# Patient Record
Sex: Female | Born: 1965 | Race: White | Hispanic: No | Marital: Single | Smoking: Never smoker
Health system: Southern US, Community
[De-identification: ages and names within clinical notes are randomized; demographics above are authoritative.]

## PROBLEM LIST (undated history)

## (undated) DIAGNOSIS — J302 Other seasonal allergic rhinitis: Secondary | ICD-10-CM

## (undated) DIAGNOSIS — I10 Essential (primary) hypertension: Secondary | ICD-10-CM

## (undated) DIAGNOSIS — E559 Vitamin D deficiency, unspecified: Secondary | ICD-10-CM

## (undated) DIAGNOSIS — E041 Nontoxic single thyroid nodule: Secondary | ICD-10-CM

## (undated) DIAGNOSIS — Z8669 Personal history of other diseases of the nervous system and sense organs: Secondary | ICD-10-CM

## (undated) DIAGNOSIS — I499 Cardiac arrhythmia, unspecified: Secondary | ICD-10-CM

## (undated) DIAGNOSIS — R42 Dizziness and giddiness: Secondary | ICD-10-CM

## (undated) DIAGNOSIS — R51 Headache: Secondary | ICD-10-CM

## (undated) DIAGNOSIS — F419 Anxiety disorder, unspecified: Secondary | ICD-10-CM

## (undated) DIAGNOSIS — K589 Irritable bowel syndrome without diarrhea: Secondary | ICD-10-CM

## (undated) DIAGNOSIS — R519 Headache, unspecified: Secondary | ICD-10-CM

## (undated) DIAGNOSIS — R591 Generalized enlarged lymph nodes: Secondary | ICD-10-CM

## (undated) DIAGNOSIS — K219 Gastro-esophageal reflux disease without esophagitis: Secondary | ICD-10-CM

## (undated) DIAGNOSIS — M797 Fibromyalgia: Secondary | ICD-10-CM

## (undated) HISTORY — DX: Generalized enlarged lymph nodes: R59.1

## (undated) HISTORY — DX: Irritable bowel syndrome, unspecified: K58.9

## (undated) HISTORY — PX: DILATION AND CURETTAGE OF UTERUS: SHX78

## (undated) HISTORY — DX: Essential (primary) hypertension: I10

## (undated) HISTORY — DX: Dizziness and giddiness: R42

## (undated) HISTORY — DX: Fibromyalgia: M79.7

## (undated) HISTORY — DX: Personal history of other diseases of the nervous system and sense organs: Z86.69

## (undated) HISTORY — DX: Other seasonal allergic rhinitis: J30.2

## (undated) HISTORY — PX: VAGINAL HYSTERECTOMY: SUR661

## (undated) HISTORY — DX: Vitamin D deficiency, unspecified: E55.9

## (undated) HISTORY — DX: Gastro-esophageal reflux disease without esophagitis: K21.9

---

## 1995-06-30 HISTORY — PX: EYE SURGERY: SHX253

## 1998-06-03 ENCOUNTER — Other Ambulatory Visit: Admission: RE | Admit: 1998-06-03 | Discharge: 1998-06-03 | Payer: Self-pay | Admitting: Obstetrics and Gynecology

## 1999-06-18 ENCOUNTER — Other Ambulatory Visit: Admission: RE | Admit: 1999-06-18 | Discharge: 1999-06-18 | Payer: Self-pay | Admitting: Obstetrics and Gynecology

## 2000-01-02 ENCOUNTER — Encounter: Admission: RE | Admit: 2000-01-02 | Discharge: 2000-01-02 | Payer: Self-pay | Admitting: Obstetrics and Gynecology

## 2000-01-02 ENCOUNTER — Encounter: Payer: Self-pay | Admitting: Obstetrics and Gynecology

## 2000-07-12 ENCOUNTER — Other Ambulatory Visit: Admission: RE | Admit: 2000-07-12 | Discharge: 2000-07-12 | Payer: Self-pay | Admitting: Obstetrics and Gynecology

## 2001-06-07 ENCOUNTER — Encounter: Admission: RE | Admit: 2001-06-07 | Discharge: 2001-06-07 | Payer: Self-pay | Admitting: Obstetrics and Gynecology

## 2001-06-07 ENCOUNTER — Encounter: Payer: Self-pay | Admitting: Obstetrics and Gynecology

## 2001-08-16 ENCOUNTER — Encounter: Payer: Self-pay | Admitting: Gastroenterology

## 2001-08-16 ENCOUNTER — Encounter: Admission: RE | Admit: 2001-08-16 | Discharge: 2001-08-16 | Payer: Self-pay | Admitting: Gastroenterology

## 2001-10-06 ENCOUNTER — Encounter: Payer: Self-pay | Admitting: Orthopaedic Surgery

## 2001-10-06 ENCOUNTER — Encounter: Admission: RE | Admit: 2001-10-06 | Discharge: 2001-10-06 | Payer: Self-pay | Admitting: Orthopaedic Surgery

## 2001-11-15 ENCOUNTER — Encounter: Payer: Self-pay | Admitting: Gastroenterology

## 2001-11-15 ENCOUNTER — Encounter: Admission: RE | Admit: 2001-11-15 | Discharge: 2001-11-15 | Payer: Self-pay | Admitting: Gastroenterology

## 2002-05-18 ENCOUNTER — Encounter: Admission: RE | Admit: 2002-05-18 | Discharge: 2002-05-18 | Payer: Self-pay | Admitting: Family Medicine

## 2002-08-17 ENCOUNTER — Encounter: Payer: Self-pay | Admitting: Obstetrics and Gynecology

## 2002-08-17 ENCOUNTER — Encounter: Admission: RE | Admit: 2002-08-17 | Discharge: 2002-08-17 | Payer: Self-pay | Admitting: Obstetrics and Gynecology

## 2004-02-21 ENCOUNTER — Encounter: Admission: RE | Admit: 2004-02-21 | Discharge: 2004-02-21 | Payer: Self-pay | Admitting: Family Medicine

## 2004-06-24 ENCOUNTER — Other Ambulatory Visit: Admission: RE | Admit: 2004-06-24 | Discharge: 2004-06-24 | Payer: Self-pay | Admitting: Obstetrics and Gynecology

## 2004-12-01 ENCOUNTER — Ambulatory Visit (HOSPITAL_COMMUNITY): Admission: RE | Admit: 2004-12-01 | Discharge: 2004-12-01 | Payer: Self-pay | Admitting: Obstetrics and Gynecology

## 2005-07-07 ENCOUNTER — Other Ambulatory Visit: Admission: RE | Admit: 2005-07-07 | Discharge: 2005-07-07 | Payer: Self-pay | Admitting: Obstetrics and Gynecology

## 2006-07-07 ENCOUNTER — Other Ambulatory Visit: Admission: RE | Admit: 2006-07-07 | Discharge: 2006-07-07 | Payer: Self-pay | Admitting: Obstetrics and Gynecology

## 2006-12-08 ENCOUNTER — Encounter: Admission: RE | Admit: 2006-12-08 | Discharge: 2006-12-08 | Payer: Self-pay | Admitting: Obstetrics and Gynecology

## 2006-12-17 ENCOUNTER — Encounter: Admission: RE | Admit: 2006-12-17 | Discharge: 2006-12-17 | Payer: Self-pay | Admitting: Obstetrics and Gynecology

## 2007-03-18 ENCOUNTER — Encounter: Admission: RE | Admit: 2007-03-18 | Discharge: 2007-03-18 | Payer: Self-pay | Admitting: Orthopaedic Surgery

## 2007-06-20 ENCOUNTER — Encounter: Admission: RE | Admit: 2007-06-20 | Discharge: 2007-06-20 | Payer: Self-pay | Admitting: Gastroenterology

## 2007-07-11 ENCOUNTER — Other Ambulatory Visit: Admission: RE | Admit: 2007-07-11 | Discharge: 2007-07-11 | Payer: Self-pay | Admitting: Obstetrics and Gynecology

## 2007-12-22 ENCOUNTER — Encounter: Admission: RE | Admit: 2007-12-22 | Discharge: 2007-12-22 | Payer: Self-pay | Admitting: Obstetrics and Gynecology

## 2008-08-10 ENCOUNTER — Other Ambulatory Visit: Admission: RE | Admit: 2008-08-10 | Discharge: 2008-08-10 | Payer: Self-pay | Admitting: Obstetrics and Gynecology

## 2008-09-07 ENCOUNTER — Encounter: Admission: RE | Admit: 2008-09-07 | Discharge: 2008-09-07 | Payer: Self-pay | Admitting: Family Medicine

## 2008-11-22 ENCOUNTER — Encounter: Admission: RE | Admit: 2008-11-22 | Discharge: 2008-11-22 | Payer: Self-pay | Admitting: Obstetrics and Gynecology

## 2009-01-20 ENCOUNTER — Emergency Department (HOSPITAL_COMMUNITY): Admission: EM | Admit: 2009-01-20 | Discharge: 2009-01-20 | Payer: Self-pay | Admitting: Emergency Medicine

## 2009-05-03 ENCOUNTER — Encounter: Admission: RE | Admit: 2009-05-03 | Discharge: 2009-05-03 | Payer: Self-pay | Admitting: Family Medicine

## 2009-05-29 HISTORY — PX: OTHER SURGICAL HISTORY: SHX169

## 2009-07-20 ENCOUNTER — Encounter: Admission: RE | Admit: 2009-07-20 | Discharge: 2009-07-20 | Payer: Self-pay | Admitting: Otolaryngology

## 2009-08-12 ENCOUNTER — Other Ambulatory Visit: Admission: RE | Admit: 2009-08-12 | Discharge: 2009-08-12 | Payer: Self-pay | Admitting: Obstetrics and Gynecology

## 2009-11-29 ENCOUNTER — Encounter: Admission: RE | Admit: 2009-11-29 | Discharge: 2009-11-29 | Payer: Self-pay | Admitting: Obstetrics and Gynecology

## 2010-01-05 ENCOUNTER — Emergency Department (HOSPITAL_COMMUNITY): Admission: EM | Admit: 2010-01-05 | Discharge: 2010-01-05 | Payer: Self-pay | Admitting: Emergency Medicine

## 2010-04-16 ENCOUNTER — Encounter: Admission: RE | Admit: 2010-04-16 | Discharge: 2010-04-16 | Payer: Self-pay | Admitting: Obstetrics and Gynecology

## 2010-04-18 ENCOUNTER — Encounter: Admission: RE | Admit: 2010-04-18 | Discharge: 2010-04-18 | Payer: Self-pay | Admitting: Orthopaedic Surgery

## 2010-10-05 LAB — CBC
HCT: 38.3 % (ref 36.0–46.0)
Hemoglobin: 12.9 g/dL (ref 12.0–15.0)
MCV: 82.5 fL (ref 78.0–100.0)
Platelets: 239 10*3/uL (ref 150–400)
RDW: 14.1 % (ref 11.5–15.5)

## 2010-10-05 LAB — POCT CARDIAC MARKERS: Myoglobin, poc: 63.2 ng/mL (ref 12–200)

## 2010-10-05 LAB — POCT I-STAT, CHEM 8
BUN: 12 mg/dL (ref 6–23)
Calcium, Ion: 1.15 mmol/L (ref 1.12–1.32)
Creatinine, Ser: 0.8 mg/dL (ref 0.4–1.2)
Hemoglobin: 13.3 g/dL (ref 12.0–15.0)
Sodium: 140 mEq/L (ref 135–145)
TCO2: 21 mmol/L (ref 0–100)

## 2010-10-05 LAB — DIFFERENTIAL
Basophils Absolute: 0 10*3/uL (ref 0.0–0.1)
Eosinophils Absolute: 0 10*3/uL (ref 0.0–0.7)
Eosinophils Relative: 0 % (ref 0–5)
Lymphocytes Relative: 15 % (ref 12–46)
Monocytes Absolute: 0.4 10*3/uL (ref 0.1–1.0)

## 2010-12-12 ENCOUNTER — Other Ambulatory Visit: Payer: Self-pay | Admitting: Dermatology

## 2010-12-30 ENCOUNTER — Other Ambulatory Visit: Payer: Self-pay | Admitting: Obstetrics and Gynecology

## 2010-12-30 DIAGNOSIS — Z1231 Encounter for screening mammogram for malignant neoplasm of breast: Secondary | ICD-10-CM

## 2011-01-16 ENCOUNTER — Ambulatory Visit
Admission: RE | Admit: 2011-01-16 | Discharge: 2011-01-16 | Disposition: A | Discharge status: Home / Self Care | Payer: BC Managed Care – PPO | Source: Ambulatory Visit | Attending: Obstetrics and Gynecology | Admitting: Obstetrics and Gynecology

## 2011-01-16 DIAGNOSIS — Z1231 Encounter for screening mammogram for malignant neoplasm of breast: Secondary | ICD-10-CM

## 2011-02-11 ENCOUNTER — Encounter (INDEPENDENT_AMBULATORY_CARE_PROVIDER_SITE_OTHER): Payer: BC Managed Care – PPO | Admitting: Ophthalmology

## 2011-02-11 DIAGNOSIS — H43819 Vitreous degeneration, unspecified eye: Secondary | ICD-10-CM

## 2011-02-11 DIAGNOSIS — H33309 Unspecified retinal break, unspecified eye: Secondary | ICD-10-CM

## 2011-02-11 DIAGNOSIS — H2510 Age-related nuclear cataract, unspecified eye: Secondary | ICD-10-CM

## 2011-09-29 ENCOUNTER — Other Ambulatory Visit: Payer: Self-pay | Admitting: "Endocrinology

## 2011-09-29 DIAGNOSIS — E049 Nontoxic goiter, unspecified: Secondary | ICD-10-CM

## 2011-10-02 ENCOUNTER — Other Ambulatory Visit: Payer: BC Managed Care – PPO

## 2011-10-09 ENCOUNTER — Ambulatory Visit
Admission: RE | Admit: 2011-10-09 | Discharge: 2011-10-09 | Disposition: A | Discharge status: Home / Self Care | Payer: BC Managed Care – PPO | Source: Ambulatory Visit | Attending: "Endocrinology | Admitting: "Endocrinology

## 2011-10-09 DIAGNOSIS — E049 Nontoxic goiter, unspecified: Secondary | ICD-10-CM

## 2011-10-23 ENCOUNTER — Other Ambulatory Visit: Payer: Self-pay | Admitting: Dermatology

## 2011-12-15 ENCOUNTER — Other Ambulatory Visit: Payer: Self-pay | Admitting: Obstetrics and Gynecology

## 2011-12-15 DIAGNOSIS — Z1231 Encounter for screening mammogram for malignant neoplasm of breast: Secondary | ICD-10-CM

## 2012-01-22 ENCOUNTER — Ambulatory Visit
Admission: RE | Admit: 2012-01-22 | Discharge: 2012-01-22 | Disposition: A | Discharge status: Home / Self Care | Payer: BC Managed Care – PPO | Source: Ambulatory Visit | Attending: Obstetrics and Gynecology | Admitting: Obstetrics and Gynecology

## 2012-01-22 DIAGNOSIS — Z1231 Encounter for screening mammogram for malignant neoplasm of breast: Secondary | ICD-10-CM

## 2012-02-12 ENCOUNTER — Encounter (INDEPENDENT_AMBULATORY_CARE_PROVIDER_SITE_OTHER): Payer: BC Managed Care – PPO | Admitting: Ophthalmology

## 2012-02-18 ENCOUNTER — Other Ambulatory Visit: Payer: Self-pay | Admitting: Family Medicine

## 2012-02-18 DIAGNOSIS — R310 Gross hematuria: Secondary | ICD-10-CM

## 2012-02-23 ENCOUNTER — Ambulatory Visit
Admission: RE | Admit: 2012-02-23 | Discharge: 2012-02-23 | Disposition: A | Discharge status: Home / Self Care | Payer: BC Managed Care – PPO | Source: Ambulatory Visit | Attending: Family Medicine | Admitting: Family Medicine

## 2012-02-23 DIAGNOSIS — R310 Gross hematuria: Secondary | ICD-10-CM

## 2012-03-30 ENCOUNTER — Encounter (INDEPENDENT_AMBULATORY_CARE_PROVIDER_SITE_OTHER): Payer: BC Managed Care – PPO | Admitting: Ophthalmology

## 2012-03-30 DIAGNOSIS — H5210 Myopia, unspecified eye: Secondary | ICD-10-CM

## 2012-03-30 DIAGNOSIS — H33309 Unspecified retinal break, unspecified eye: Secondary | ICD-10-CM

## 2012-03-30 DIAGNOSIS — H43819 Vitreous degeneration, unspecified eye: Secondary | ICD-10-CM

## 2012-03-30 DIAGNOSIS — H2510 Age-related nuclear cataract, unspecified eye: Secondary | ICD-10-CM

## 2012-04-23 DIAGNOSIS — G43009 Migraine without aura, not intractable, without status migrainosus: Secondary | ICD-10-CM | POA: Insufficient documentation

## 2012-10-26 DIAGNOSIS — R3129 Other microscopic hematuria: Secondary | ICD-10-CM | POA: Insufficient documentation

## 2012-10-26 DIAGNOSIS — R310 Gross hematuria: Secondary | ICD-10-CM | POA: Insufficient documentation

## 2012-12-19 ENCOUNTER — Other Ambulatory Visit: Payer: Self-pay

## 2012-12-19 DIAGNOSIS — Z1231 Encounter for screening mammogram for malignant neoplasm of breast: Secondary | ICD-10-CM

## 2013-01-27 ENCOUNTER — Ambulatory Visit
Admission: RE | Admit: 2013-01-27 | Discharge: 2013-01-27 | Disposition: A | Discharge status: Home / Self Care | Payer: BC Managed Care – PPO | Source: Ambulatory Visit

## 2013-01-27 ENCOUNTER — Ambulatory Visit: Payer: BC Managed Care – PPO

## 2013-01-27 DIAGNOSIS — Z1231 Encounter for screening mammogram for malignant neoplasm of breast: Secondary | ICD-10-CM

## 2013-03-31 ENCOUNTER — Ambulatory Visit (INDEPENDENT_AMBULATORY_CARE_PROVIDER_SITE_OTHER): Payer: BC Managed Care – PPO | Admitting: Ophthalmology

## 2013-04-21 ENCOUNTER — Other Ambulatory Visit: Payer: Self-pay | Admitting: Family Medicine

## 2013-04-21 DIAGNOSIS — R1013 Epigastric pain: Secondary | ICD-10-CM

## 2013-04-26 ENCOUNTER — Other Ambulatory Visit: Payer: BC Managed Care – PPO

## 2013-05-05 ENCOUNTER — Ambulatory Visit (INDEPENDENT_AMBULATORY_CARE_PROVIDER_SITE_OTHER): Payer: BC Managed Care – PPO | Admitting: Ophthalmology

## 2013-05-05 DIAGNOSIS — H2510 Age-related nuclear cataract, unspecified eye: Secondary | ICD-10-CM

## 2013-05-05 DIAGNOSIS — H43819 Vitreous degeneration, unspecified eye: Secondary | ICD-10-CM

## 2013-05-05 DIAGNOSIS — H33309 Unspecified retinal break, unspecified eye: Secondary | ICD-10-CM

## 2014-03-16 ENCOUNTER — Other Ambulatory Visit: Payer: Self-pay

## 2014-03-16 DIAGNOSIS — Z1231 Encounter for screening mammogram for malignant neoplasm of breast: Secondary | ICD-10-CM

## 2014-04-13 ENCOUNTER — Ambulatory Visit
Admission: RE | Admit: 2014-04-13 | Discharge: 2014-04-13 | Disposition: A | Discharge status: Home / Self Care | Payer: BC Managed Care – PPO | Source: Ambulatory Visit

## 2014-04-13 ENCOUNTER — Encounter (INDEPENDENT_AMBULATORY_CARE_PROVIDER_SITE_OTHER): Payer: Self-pay

## 2014-04-13 DIAGNOSIS — Z1231 Encounter for screening mammogram for malignant neoplasm of breast: Secondary | ICD-10-CM

## 2014-04-17 ENCOUNTER — Other Ambulatory Visit: Payer: Self-pay | Admitting: Nurse Practitioner

## 2014-04-17 DIAGNOSIS — R2231 Localized swelling, mass and lump, right upper limb: Secondary | ICD-10-CM

## 2014-04-27 ENCOUNTER — Ambulatory Visit
Admission: RE | Admit: 2014-04-27 | Discharge: 2014-04-27 | Disposition: A | Discharge status: Home / Self Care | Payer: BC Managed Care – PPO | Source: Ambulatory Visit | Attending: Nurse Practitioner | Admitting: Nurse Practitioner

## 2014-04-27 DIAGNOSIS — R2231 Localized swelling, mass and lump, right upper limb: Secondary | ICD-10-CM

## 2014-06-01 ENCOUNTER — Ambulatory Visit (INDEPENDENT_AMBULATORY_CARE_PROVIDER_SITE_OTHER): Payer: BC Managed Care – PPO | Admitting: Ophthalmology

## 2014-06-01 DIAGNOSIS — H33303 Unspecified retinal break, bilateral: Secondary | ICD-10-CM

## 2014-06-01 DIAGNOSIS — H43813 Vitreous degeneration, bilateral: Secondary | ICD-10-CM

## 2015-05-22 ENCOUNTER — Other Ambulatory Visit: Payer: Self-pay

## 2015-05-22 DIAGNOSIS — Z1231 Encounter for screening mammogram for malignant neoplasm of breast: Secondary | ICD-10-CM

## 2015-06-14 ENCOUNTER — Ambulatory Visit (INDEPENDENT_AMBULATORY_CARE_PROVIDER_SITE_OTHER): Payer: BLUE CROSS/BLUE SHIELD | Admitting: Ophthalmology

## 2015-06-14 DIAGNOSIS — H2512 Age-related nuclear cataract, left eye: Secondary | ICD-10-CM

## 2015-06-14 DIAGNOSIS — H33303 Unspecified retinal break, bilateral: Secondary | ICD-10-CM | POA: Diagnosis not present

## 2015-06-14 DIAGNOSIS — H35033 Hypertensive retinopathy, bilateral: Secondary | ICD-10-CM | POA: Diagnosis not present

## 2015-06-14 DIAGNOSIS — H25031 Anterior subcapsular polar age-related cataract, right eye: Secondary | ICD-10-CM

## 2015-06-14 DIAGNOSIS — H43813 Vitreous degeneration, bilateral: Secondary | ICD-10-CM | POA: Diagnosis not present

## 2015-06-14 DIAGNOSIS — I10 Essential (primary) hypertension: Secondary | ICD-10-CM | POA: Diagnosis not present

## 2015-06-28 ENCOUNTER — Ambulatory Visit
Admission: RE | Admit: 2015-06-28 | Discharge: 2015-06-28 | Disposition: A | Discharge status: Home / Self Care | Payer: BLUE CROSS/BLUE SHIELD | Source: Ambulatory Visit

## 2015-06-28 DIAGNOSIS — Z1231 Encounter for screening mammogram for malignant neoplasm of breast: Secondary | ICD-10-CM

## 2015-07-01 ENCOUNTER — Ambulatory Visit (INDEPENDENT_AMBULATORY_CARE_PROVIDER_SITE_OTHER): Payer: BLUE CROSS/BLUE SHIELD | Admitting: Ophthalmology

## 2015-07-01 DIAGNOSIS — H33332 Multiple defects of retina without detachment, left eye: Secondary | ICD-10-CM

## 2015-11-08 ENCOUNTER — Ambulatory Visit (INDEPENDENT_AMBULATORY_CARE_PROVIDER_SITE_OTHER): Payer: BLUE CROSS/BLUE SHIELD | Admitting: Ophthalmology

## 2015-11-08 DIAGNOSIS — H33303 Unspecified retinal break, bilateral: Secondary | ICD-10-CM | POA: Diagnosis not present

## 2015-11-08 DIAGNOSIS — I10 Essential (primary) hypertension: Secondary | ICD-10-CM

## 2015-11-08 DIAGNOSIS — H43813 Vitreous degeneration, bilateral: Secondary | ICD-10-CM | POA: Diagnosis not present

## 2015-11-08 DIAGNOSIS — H35033 Hypertensive retinopathy, bilateral: Secondary | ICD-10-CM

## 2016-05-26 ENCOUNTER — Other Ambulatory Visit: Payer: Self-pay | Admitting: Obstetrics and Gynecology

## 2016-05-26 DIAGNOSIS — Z1231 Encounter for screening mammogram for malignant neoplasm of breast: Secondary | ICD-10-CM

## 2016-07-06 ENCOUNTER — Ambulatory Visit
Admission: RE | Admit: 2016-07-06 | Discharge: 2016-07-06 | Disposition: A | Discharge status: Home / Self Care | Payer: BLUE CROSS/BLUE SHIELD | Source: Ambulatory Visit | Attending: Obstetrics and Gynecology | Admitting: Obstetrics and Gynecology

## 2016-07-06 DIAGNOSIS — Z1231 Encounter for screening mammogram for malignant neoplasm of breast: Secondary | ICD-10-CM

## 2016-10-20 DIAGNOSIS — K219 Gastro-esophageal reflux disease without esophagitis: Secondary | ICD-10-CM | POA: Insufficient documentation

## 2016-10-20 DIAGNOSIS — M26629 Arthralgia of temporomandibular joint, unspecified side: Secondary | ICD-10-CM | POA: Insufficient documentation

## 2016-10-20 DIAGNOSIS — J301 Allergic rhinitis due to pollen: Secondary | ICD-10-CM | POA: Insufficient documentation

## 2016-10-30 ENCOUNTER — Ambulatory Visit (INDEPENDENT_AMBULATORY_CARE_PROVIDER_SITE_OTHER): Payer: BLUE CROSS/BLUE SHIELD | Admitting: Ophthalmology

## 2016-10-30 DIAGNOSIS — H33303 Unspecified retinal break, bilateral: Secondary | ICD-10-CM | POA: Diagnosis not present

## 2016-10-30 DIAGNOSIS — H43813 Vitreous degeneration, bilateral: Secondary | ICD-10-CM | POA: Diagnosis not present

## 2016-10-30 DIAGNOSIS — H35033 Hypertensive retinopathy, bilateral: Secondary | ICD-10-CM

## 2016-10-30 DIAGNOSIS — I10 Essential (primary) hypertension: Secondary | ICD-10-CM

## 2016-10-30 DIAGNOSIS — H2513 Age-related nuclear cataract, bilateral: Secondary | ICD-10-CM | POA: Diagnosis not present

## 2016-11-13 ENCOUNTER — Ambulatory Visit (INDEPENDENT_AMBULATORY_CARE_PROVIDER_SITE_OTHER): Payer: BLUE CROSS/BLUE SHIELD | Admitting: Ophthalmology

## 2017-04-13 DIAGNOSIS — M797 Fibromyalgia: Secondary | ICD-10-CM

## 2017-04-13 HISTORY — DX: Fibromyalgia: M79.7

## 2017-04-29 ENCOUNTER — Encounter: Payer: Self-pay | Admitting: Cardiothoracic Surgery

## 2017-04-29 ENCOUNTER — Encounter: Payer: Self-pay | Admitting: *Deleted

## 2017-04-29 ENCOUNTER — Institutional Professional Consult (permissible substitution) (INDEPENDENT_AMBULATORY_CARE_PROVIDER_SITE_OTHER): Payer: 59 | Admitting: Cardiothoracic Surgery

## 2017-04-29 ENCOUNTER — Other Ambulatory Visit: Payer: Self-pay | Admitting: *Deleted

## 2017-04-29 VITALS — BP 145/98 | HR 88 | Ht 69.0 in | Wt 157.0 lb

## 2017-04-29 DIAGNOSIS — I10 Essential (primary) hypertension: Secondary | ICD-10-CM

## 2017-04-29 DIAGNOSIS — K219 Gastro-esophageal reflux disease without esophagitis: Secondary | ICD-10-CM

## 2017-04-29 DIAGNOSIS — Z8669 Personal history of other diseases of the nervous system and sense organs: Secondary | ICD-10-CM

## 2017-04-29 DIAGNOSIS — R590 Localized enlarged lymph nodes: Secondary | ICD-10-CM

## 2017-04-29 DIAGNOSIS — R220 Localized swelling, mass and lump, head: Secondary | ICD-10-CM

## 2017-04-29 DIAGNOSIS — E559 Vitamin D deficiency, unspecified: Secondary | ICD-10-CM | POA: Insufficient documentation

## 2017-04-29 DIAGNOSIS — J302 Other seasonal allergic rhinitis: Secondary | ICD-10-CM

## 2017-04-29 DIAGNOSIS — K589 Irritable bowel syndrome without diarrhea: Secondary | ICD-10-CM | POA: Insufficient documentation

## 2017-04-29 DIAGNOSIS — R42 Dizziness and giddiness: Secondary | ICD-10-CM

## 2017-04-29 DIAGNOSIS — E042 Nontoxic multinodular goiter: Secondary | ICD-10-CM | POA: Insufficient documentation

## 2017-04-29 DIAGNOSIS — M797 Fibromyalgia: Secondary | ICD-10-CM

## 2017-04-29 NOTE — Progress Notes (Signed)
[address]       [place] [address]             [phone]                    Kristin [name] [facility] Medical Record #[id number] Date of Birth: [birth year]  Referring: Glenford Peers, MD Primary Care: Olivia Canter, MD  Chief Complaint:    Chief Complaint  Patient presents with  . New Patient (Initial Visit)    lymphadenopathy tumor mass, CT Neck 04/24/2017    History of Present Illness:    Kristin [name] 51 y.o. female is seen in the office  today for 9-10 months of left face swelling.  She has a history of goiter with a thyroid biopsy done in 2013.  She has had ENT and dental evaluation for the face swelling.  She denies any history of fever chills night sweats, weight has been stable and 157 pounds.  The patient had a CT scan of the neck done at [facility] imaging report dated 04/24/2017, type report notes marked lymphadenopathy tumor mass-effect circum-the left lateral aortic arch there is lymphadenopathy in the precarinal and paratracheal areas    Family history is significant for an identical twin sister who has asthma and hypertension. Current Activity/ Functional Status:  Patient is independent with mobility/ambulation, transfers, ADL's, IADL's.   Zubrod Score: At the time of surgery this patient's most appropriate activity status/level should be described as: []     0    Normal activity, no symptoms []     1    Restricted in physical strenuous activity but ambulatory, able to do out light work []     2    Ambulatory and capable of self care, unable to do work activities, up and about               >50 % of waking hours                              []     3    Only limited self care, in bed greater than 50% of waking hours []     4    Completely disabled, no self care, confined to bed or chair []     5    Moribund   Past Medical History:  Diagnosis Date  . Dizziness   . Fibromyalgia 04/13/2017  . GERD (gastroesophageal reflux disease)   .  History of migraine headaches   . Hypertension   . IBS (irritable bowel syndrome)   . Lymphadenopathy   . Seasonal allergies   . Vertigo   . Vitamin D deficiency     Past Surgical History:  Procedure Laterality Date  . DILATION AND CURETTAGE OF UTERUS  2006, 2009  . EYE SURGERY Left 1997   LASER  . THYOID NODULE  05/2009   FNA...DR. Richardson Landry .Marland KitchenMarland KitchenMarland KitchenNEG  . VAGINAL HYSTERECTOMY  12/2017   LAPAROSCOPICALLY ASSISTED/DR. ELIZABETH SKINNER    Family History  Problem Relation Age of Onset  . Hypertension Mother   . Diabetes Mother   . Hyperthyroidism Mother   . Hypertension Father   . Heart disease Father   . Hypothyroidism Father   . Cardiomyopathy Father   . Other Father 52       CAUSE OF DEATH..CEREBRAL HEMORR  . Cardiomyopathy Paternal Uncle   . Heart disease Paternal Uncle  CABG  . Diabetes Maternal Grandmother   . Hypertension Maternal Grandmother   . Heart disease Maternal Grandmother        MI  . Other Maternal Grandmother        GRAVES DISEASE  . Cardiomyopathy Maternal Grandmother   . Other Maternal Grandfather        AAA  . Cardiomyopathy Maternal Grandfather   . Heart disease Maternal Grandfather        MI  . Cancer Paternal Grandmother        BREAST, OTHER UNSPECIFIED SITE  . Hypertension Paternal Grandfather   . Stroke Paternal Grandfather        ISCHEMIC STROKE  . Other Maternal Aunt        THYROID DISEASE  . Heart disease Paternal Aunt        CABG  . Cardiomyopathy Paternal Aunt   . Cancer Paternal Aunt        STOMACH  . Other Cousin        THYROID     Social History   Social History  . Marital status: Single    Spouse name: N/A  . Number of children: N/A  . Years of education: N/A   Occupational History  . Not on file.   Social History Main Topics  . Smoking status: Never Smoker  . Smokeless tobacco: Never Used  . Alcohol use No  . Drug use: No  . Sexual activity: No   Other Topics Concern  . Not on file   Social  History Narrative  . No narrative on file    History  Smoking Status  . Never Smoker  Smokeless Tobacco  . Never Used    History  Alcohol Use No     Allergies  Allergen Reactions  . Amoxicillin Hives  . Azithromycin Other (See Comments)    UPSET STOMACH   . Bentyl [Dicyclomine Hcl] Diarrhea  . Doxycycline Hives  . Erythromycin Other (See Comments)    STOMACH CRAMPS  . Indocin [Indomethacin] Hives  . Lansoprazole Other (See Comments)    HEADACHE  . Levaquin [Levofloxacin] Hives  . Nexium [Esomeprazole] Hives and Other (See Comments)    HEADACHE   . Probiotic [Acidophilus] Hives  . Sudafed [Pseudoephedrine Hcl] Other (See Comments)    FAST PULSE  . Sulfamethoxazole   . Tetanus Toxoids Other (See Comments)    LOCAL REACTION...REDNESS/KNOT  . Keflex [Cephalexin] Rash    Current Outpatient Prescriptions  Medication Sig Dispense Refill  . acetaminophen (TYLENOL) 500 MG tablet Take 500 mg by mouth every 8 (eight) hours as needed.    . Biotin 11914 MCG TABS Take 1 tablet by mouth daily.    . Cholecalciferol (VITAMIN D3) 5000 units CAPS Take 1 capsule by mouth daily.    Marland Kitchen doxepin (SINEQUAN) 10 MG capsule Take 10 mg by mouth at bedtime. 4-6 CAPSULES HS    . frovatriptan (FROVA) 2.5 MG tablet Take 2.5 mg by mouth as needed for migraine. If recurs, may repeat after 2 hours. Max of 3 tabs in 24 hours.    Marland Kitchen loratadine (CLARITIN) 10 MG tablet Take 10 mg by mouth daily as needed for allergies.    Marland Kitchen losartan (COZAAR) 25 MG tablet Take 25 mg by mouth daily.    . Multiple Vitamin (MULTIVITAMIN) tablet Take 1 tablet by mouth daily.    Marland Kitchen Zoster Vaccine Adjuvanted [facility]) injection Inject 0.5 mLs into the muscle once. NOW (10/16.18) AND IN 2 TO 6 MONTHS  No current facility-administered medications for this visit.     Pertinent items are noted in HPI.   Review of Systems:     Cardiac Review of Systems: Y or N  Chest Pain [ N   ]  Resting SOB [ N] Exertional SOB  Klaus.Mock[N  ]    Pollyann Kennedyrthopnea Klaus.Mock[N  ]   Pedal Edema Klaus.Mock[N  ]    Palpitations Klaus.Mock[N ] SN ]   Presyncope [ N  ]  General Review of Systems: [Y] = yes [  ]=no Constitional: recent weight change Klaus.Mock[N  ];  Wt loss over the last 3 months [   ] anorexia [  ]; fatigue [  ]; nausea [  ]; night sweats [ N ]; fever [ N ]; or chills N[  ];          Dental: poor dentition[N  ]; Last Dentist visit:   Eye : blurred vision [  ]; diplopia [   ]; vision changes [  ];  Amaurosis fugax[  ]; Resp: cough Klaus.Mock[N  ];  wheezing[N;  hemoptysis[ N ]; shortness of breath[ N ]; paroxysmal nocturnal dyspnea[ N]; dyspnea on exertion[  ]; or orthopnea[  ];  GI:  gallstones[  ], vomiting[  ];  dysphagia[  ]; melena[  ];  hematochezia [  ]; heartburn[  ];   Hx of  Colonoscopy[  ]; GU: kidney stones [  ]; hematuria[  ];   dysuria [  ];  nocturia[  ];  history of     obstruction [  ]; urinary frequency [  ]             Skin: rash, swelling[  ];, hair loss[  ];  peripheral edema[  ];  or itching[  ]; Musculosketetal: myalgias[  ];  joint swelling[  ];  joint erythema[  ];  joint pain[  ];  back pain[  ];  Heme/Lymph: bruising[  ];  bleeding[  ];  anemia[  ];  Neuro: TIA[  ];  headaches[  ];  stroke[  ];  vertigo[  ];  seizures[  ];   paresthesias[  ];  difficulty walking[  ];  Psych:depression[  ]; anxiety[  ];  Endocrine: diabetes[ N ];  thyroid dysfunction[Y  ];  Immunizations: Flu up to date [ Y ]; Pneumococcal up to date [ Y ];  Other:  Physical Exam: BP (!) 145/98 (BP Location: Left Arm)   Pulse 88   Ht 5\' 9"  (1.753 m)   Wt 157 lb (71.2 kg)   SpO2 97%   BMI 23.18 kg/m   PHYSICAL EXAMINATION: General appearance: alert, cooperative and appears stated age Head: Normocephalic, without obvious abnormality, atraumatic Neck: no adenopathy, no carotid bruit, no JVD, supple, symmetrical, trachea midline and thyroid not enlarged, symmetric, no tenderness/mass/nodules Lymph nodes: Cervical, supraclavicular, and axillary nodes normal. and right supuclavicular  fossa fills full, no definate node  Resp: clear to auscultation bilaterally Back: symmetric, no curvature. ROM normal. No CVA tenderness. Cardio: regular rate and rhythm, S1, S2 normal, no murmur, click, rub or gallop GI: soft, non-tender; bowel sounds normal; no masses,  no organomegaly Extremities: extremities normal, atraumatic, no cyanosis or edema and Homans sign is negative, no sign of DVT Neurologic: Grossly normal  Diagnostic Studies & Laboratory data:   Paper report of ct scan , unable view disk she brought with her       Recent Lab Findings: Lab Results  Component Value Date   WBC 7.9  01/20/2009   HGB 13.3 01/20/2009   HCT 39.0 01/20/2009   PLT 239 01/20/2009   GLUCOSE 92 01/20/2009   NA 140 01/20/2009   K 3.6 01/20/2009   CL 107 01/20/2009   CREATININE 0.8 01/20/2009   BUN 12 01/20/2009      Assessment / Plan:   CT of neck suggests significant adenopathy in the chest that will require bx. I have recommended to the patient that we proceed with PET scan to fully evaluate adenopathy and determine the most like are to bx  . We will arrange  PET ASAP and then follow up with biopsy as indicated       I  spent 40 minutes counseling the patient face to face and 50% or more the  time was spent in counseling and coordination of care. The total time spent in the appointment was 60 minutes.  Delight Ovens MD      [address] [place] [place] [address] Office [phone]   Beeper [phone]  04/29/2017 12:33 PM

## 2017-05-05 ENCOUNTER — Encounter
Admission: RE | Admit: 2017-05-05 | Discharge: 2017-05-05 | Disposition: A | Discharge status: Home / Self Care | Payer: 59 | Source: Ambulatory Visit | Attending: Cardiothoracic Surgery | Admitting: Cardiothoracic Surgery

## 2017-05-05 DIAGNOSIS — R220 Localized swelling, mass and lump, head: Secondary | ICD-10-CM | POA: Diagnosis present

## 2017-05-05 DIAGNOSIS — R590 Localized enlarged lymph nodes: Secondary | ICD-10-CM | POA: Insufficient documentation

## 2017-05-05 LAB — GLUCOSE, CAPILLARY: Glucose-Capillary: 95 mg/dL (ref 65–99)

## 2017-05-05 MED ORDER — FLUDEOXYGLUCOSE F - 18 (FDG) INJECTION
12.2500 | Freq: Once | INTRAVENOUS | Status: AC | PRN
  Administered 2017-05-05: 12.25 via INTRAVENOUS

## 2017-05-06 ENCOUNTER — Encounter: Payer: Self-pay | Admitting: Cardiothoracic Surgery

## 2017-05-06 ENCOUNTER — Ambulatory Visit (INDEPENDENT_AMBULATORY_CARE_PROVIDER_SITE_OTHER): Payer: 59 | Admitting: Cardiothoracic Surgery

## 2017-05-06 ENCOUNTER — Other Ambulatory Visit: Payer: Self-pay

## 2017-05-06 VITALS — BP 159/110 | HR 109 | Resp 16 | Ht 69.0 in | Wt 157.0 lb

## 2017-05-06 DIAGNOSIS — R220 Localized swelling, mass and lump, head: Secondary | ICD-10-CM | POA: Diagnosis not present

## 2017-05-06 DIAGNOSIS — R590 Localized enlarged lymph nodes: Secondary | ICD-10-CM | POA: Diagnosis not present

## 2017-05-06 NOTE — Progress Notes (Signed)
[address]       [place] [id number]             [phone]                    [name] [facility] Medical Record #[id number] Date of Birth: 1965/09/03  Referring: Glenford PeersNeijstrom, Eric, MD Primary Care: Olivia CanterKelly, [name], MD  Chief Complaint:    Chief Complaint  Patient presents with  . Follow-up    after PET 05/05/17    History of Present Illness:    Kristin [name] 51 y.o. female is seen in the office  today for 9-10 months of left face swelling.  She has a history of goiter with a thyroid biopsy done in 2013.  She has had ENT and dental evaluation for the face swelling.  She denies any history of fever chills night sweats, weight has been stable and 157 pounds.  The patient had a CT [facility] report dated 04/24/2017, type report notes marked lymphadenopathy tumor mass-effect circum-the left lateral aortic arch there is lymphadenopathy in the precarinal and paratracheal areas   Patient returns today after having PET scan to fully evaluate her lymph node status-and to make a decision about the most appropriate biopsy method  Family history is significant for an identical twin sister who has asthma and hypertension. Current Activity/ Functional Status:  Patient is independent with mobility/ambulation, transfers, ADL's, IADL's.   Zubrod Score: At the time of surgery this patient's most appropriate activity status/level should be described as: [x]     0    Normal activity, no symptoms []     1    Restricted in physical strenuous activity but ambulatory, able to do out light work []     2    Ambulatory and capable of self care, unable to do work activities, up and about               >50 % of waking hours                              []     3    Only limited self care, in bed greater than 50% of waking hours []     4    Completely disabled, no self care, confined to bed or chair []     5    Moribund   Past Medical History:  Diagnosis  Date  . Dizziness   . Fibromyalgia 04/13/2017  . GERD (gastroesophageal reflux disease)   . History of migraine headaches   . Hypertension   . IBS (irritable bowel syndrome)   . Lymphadenopathy   . Seasonal allergies   . Vertigo   . Vitamin D deficiency     Past Surgical History:  Procedure Laterality Date  . DILATION AND CURETTAGE OF UTERUS  2006, 2009  . EYE SURGERY Left 1997   LASER  . THYOID NODULE  05/2009   FNA...DR. Richardson LandrySOLLENBERGER .Marland Kitchen.Marland Kitchen.Marland Kitchen.NEG  . VAGINAL HYSTERECTOMY  12/2017   LAPAROSCOPICALLY ASSISTED/DR. ELIZABETH SKINNER    Family History  Problem Relation Age of Onset  . Hypertension Mother   . Diabetes Mother   . Hyperthyroidism Mother   . Hypertension Father   . Heart disease Father   . Hypothyroidism Father   . Cardiomyopathy Father   . Other Father [id number]       CAUSE OF DEATH..CEREBRAL  HEMORR  . Cardiomyopathy Paternal Uncle   . Heart disease Paternal Uncle        CABG  . Diabetes Maternal Grandmother   . Hypertension Maternal Grandmother   . Heart disease Maternal Grandmother        MI  . Other Maternal Grandmother        GRAVES DISEASE  . Cardiomyopathy Maternal Grandmother   . Other Maternal Grandfather        AAA  . Cardiomyopathy Maternal Grandfather   . Heart disease Maternal Grandfather        MI  . Cancer Paternal Grandmother        BREAST, OTHER UNSPECIFIED SITE  . Hypertension Paternal Grandfather   . Stroke Paternal Grandfather        ISCHEMIC STROKE  . Other Maternal Aunt        THYROID DISEASE  . Heart disease Paternal Aunt        CABG  . Cardiomyopathy Paternal Aunt   . Cancer Paternal Aunt        STOMACH  . Other Cousin        THYROID     Social History   Socioeconomic History  . Marital status: Single    Spouse name: Not on file  .    .    .    Social Needs  .    .    .    .    .    Occupational History  t  teaches elementary school  Tobacco Use  . Smoking status: Never Smoker  . Smokeless tobacco: Never Used    Substance and Sexual Activity  . Alcohol use: No  . Drug use: No  . Sexual activity: No    Partners: Male  Other Topics Concern  . Not on file  Social History Narrative  . Not on file    Social History   Tobacco Use  Smoking Status Never Smoker  Smokeless Tobacco Never Used    Social History   Substance and Sexual Activity  Alcohol Use No     Allergies  Allergen Reactions  . Amoxicillin Hives  . Azithromycin Other (See Comments)    UPSET STOMACH   . Bentyl [Dicyclomine Hcl] Diarrhea  . Doxycycline Hives  . Erythromycin Other (See Comments)    STOMACH CRAMPS  . Indocin [Indomethacin] Hives  . Lansoprazole Other (See Comments)    HEADACHE  . Levaquin [Levofloxacin] Hives  . Nexium [Esomeprazole] Hives and Other (See Comments)    HEADACHE   . Probiotic [Acidophilus] Hives  . Sudafed [Pseudoephedrine Hcl] Other (See Comments)    FAST PULSE  . Sulfamethoxazole   . Tetanus Toxoids Other (See Comments)    LOCAL REACTION...REDNESS/KNOT  . Keflex [Cephalexin] Rash    Current Outpatient Medications  Medication Sig Dispense Refill  . acetaminophen (TYLENOL) 500 MG tablet Take 500 mg by mouth every 8 (eight) hours as needed.    . Biotin 1610910000 MCG TABS Take 1 tablet by mouth daily.    . Cholecalciferol (VITAMIN D3) 5000 units CAPS Take 1 capsule by mouth daily.    Marland Kitchen. doxepin (SINEQUAN) 10 MG capsule Take 10 mg by mouth at bedtime. 4-6 CAPSULES HS    . frovatriptan (FROVA) 2.5 MG tablet Take 2.5 mg by mouth as needed for migraine. If recurs, may repeat after 2 hours. Max of 3 tabs in 24 hours.    Marland Kitchen. loratadine (CLARITIN) 10 MG tablet Take 10 mg by mouth daily as  needed for allergies.    Marland Kitchen losartan (COZAAR) 25 MG tablet Take 25 mg by mouth daily.    . Multiple Vitamin (MULTIVITAMIN) tablet Take 1 tablet by mouth daily.    Marland Kitchen Zoster Vaccine Adjuvanted [facility]) injection Inject 0.5 mLs into the muscle once. NOW (10/16.18) AND IN 2 TO 6 MONTHS     No current  facility-administered medications for this visit.     Pertinent items are noted in HPI.   Review of Systems:     Cardiac Review of Systems: Y or N  Chest Pain [ N   ]  Resting SOB [ N] Exertional SOB  Klaus.Mock  ]  Pollyann Kennedy Klaus.Mock  ]   Pedal Edema Klaus.Mock  ]    Palpitations Klaus.Mock ] SN ]   Presyncope [ N  ]  General Review of Systems: [Y] = yes [  ]=no Constitional: recent weight change Klaus.Mock  ];  Wt loss over the last 3 months [   ] anorexia [  ]; fatigue [  ]; nausea [  ]; night sweats [ N ]; fever [ N ]; or chills N[  ];          Dental: poor dentition[N  ]; Last Dentist visit:   Eye : blurred vision [  ]; diplopia [   ]; vision changes [  ];  Amaurosis fugax[  ]; Resp: cough Klaus.Mock  ];  wheezing[N;  hemoptysis[ N ]; shortness of breath[ N ]; paroxysmal nocturnal dyspnea[ N]; dyspnea on exertion[  ]; or orthopnea[  ];  GI:  gallstones[  ], vomiting[  ];  dysphagia[  ]; melena[  ];  hematochezia [  ]; heartburn[  ];   Hx of  Colonoscopy[  ]; GU: kidney stones [  ]; hematuria[  ];   dysuria [  ];  nocturia[  ];  history of     obstruction [  ]; urinary frequency [  ]             Skin: rash, swelling[  ];, hair loss[  ];  peripheral edema[  ];  or itching[  ]; Musculosketetal: myalgias[  ];  joint swelling[  ];  joint erythema[  ];  joint pain[  ];  back pain[  ];  Heme/Lymph: bruising[  ];  bleeding[  ];  anemia[  ];  Neuro: TIA[  ];  headaches[  ];  stroke[  ];  vertigo[  ];  seizures[  ];   paresthesias[  ];  difficulty walking[  ];  Psych:depression[  ]; anxiety[  ];  Endocrine: diabetes[ N ];  thyroid dysfunction[Y  ];  Immunizations: Flu up to date [ Y ]; Pneumococcal up to date [ Y ];  Other:  Physical Exam: BP (!) 159/110 (BP Location: Left Arm, Patient Position: Sitting, Cuff Size: Normal)   Pulse (!) 109   Resp 16   Ht 5\' 9"  (1.753 m)   Wt 157 lb (71.2 kg)   SpO2 97% Comment: ON RA  BMI 23.18 kg/m   PHYSICAL EXAMINATION: Physical exam is unchanged from last week General appearance: alert,  cooperative and appears stated age Head: Normocephalic, without obvious abnormality, atraumatic Neck: no adenopathy, no carotid bruit, no JVD, supple, symmetrical, trachea midline and thyroid not enlarged, symmetric, no tenderness/mass/nodules Lymph nodes: Cervical, supraclavicular, and axillary nodes normal. and right supuclavicular fossa fills full, no definate node  Resp: clear to auscultation bilaterally Back: symmetric, no curvature. ROM normal. No CVA tenderness. Cardio: regular rate and rhythm, S1,  S2 normal, no murmur, click, rub or gallop GI: soft, non-tender; bowel sounds normal; no masses,  no organomegaly Extremities: extremities normal, atraumatic, no cyanosis or edema and Homans sign is negative, no sign of DVT Neurologic: Grossly normal  Diagnostic Studies & Laboratory data:   Paper report of ct scan , unable view disk she brought with her     Nm Pet Image Initial (pi) Skull Base To Thigh  Result Date: 05/05/2017 CLINICAL DATA:  Initial Treatment strategy for mediastinal adenopathy. EXAM: NUCLEAR MEDICINE PET SKULL BASE TO THIGH TECHNIQUE: 12.3 mCi F-18 FDG was injected intravenously. Full-ring PET imaging was performed from the skull base to thigh after the radiotracer. CT data was obtained and used for attenuation correction and anatomic localization. FASTING BLOOD GLUCOSE:  Value: 95 mg/dl COMPARISON:  Left-sided facial CT dated 04/24/2017 FINDINGS: NECK No hypermetabolic lymph nodes in the neck. The nodular right lobe of the thyroid gland is not hypermetabolic, which typically indicates that the nodule is benign. CHEST Hypermetabolic prevascular, AP window, right paratracheal, right hilar, left hilar, and subcarinal adenopathy in the chest. Index AP window lymph node measures 1.8 cm in short axis on image 87/4 and has maximum standard uptake value 13.2. Right hilar node approximately 1.3 cm in short axis on image 92/4 with maximum SUV 11.7. A peribronchovascular nodule in the left  upper lobe is only about 5 by 7 mm in size on image 79/4 but has a maximum standard uptake value of 4.6 Centrilobular emphysema. 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 ABDOMEN/PELVIS No abnormal hypermetabolic activity within the liver, pancreas, adrenal glands, or spleen. No hypermetabolic lymph nodes in the abdomen or pelvis. SKELETON No focal hypermetabolic activity to suggest skeletal metastasis. IMPRESSION: 1. Mediastinal and bilateral hilar adenopathy is considerably hypermetabolic and suspicious for malignancy. There is a small (5 by 7 mm) hypermetabolic nodule in the left upper lobe in the peribronchovascular region. This is a very small size to represent a primary, and may simply be a peribronchovascular malignant lymph node. Lymphoma is a distinct possibility and tissue diagnosis is recommended. 2. No hypermetabolic activity associated with the nodularity in the right thyroid gland. This usually is fairly specific for benign thyroid nodule. 3. Centrilobular emphysema. 4. Aberrant right subclavian artery passes behind the esophagus. Electronically Signed   By: Gaylyn Rong M.D.   On: 05/05/2017 14:31  I have independently reviewed the above radiology studies  and reviewed the findings with the patient.    Recent Lab Findings: Lab Results  Component Value Date   WBC 7.9 01/20/2009   HGB 13.3 01/20/2009   HCT 39.0 01/20/2009   PLT 239 01/20/2009   GLUCOSE 92 01/20/2009   NA 140 01/20/2009   K 3.6 01/20/2009   CL 107 01/20/2009   CREATININE 0.8 01/20/2009   BUN 12 01/20/2009      Assessment / Plan:   CT of neck suggests significant adenopathy in the chest that will require bx.  Mediastinal and bilateral hilar adenopathy is considerably hypermetabolic Aberrant right subclavian artery passes behind the esophagus  I reviewed the PET scan in detail with the patient and pointed out the areas of mediastinal hypermetabolic activity and mild  node enlargement.  Most pronounced node enlargement is in the AP window.  I recommended to the patient that we proceed with obtaining a tissue diagnosis.  Patient is very hesitant about having any invasive procedures, I suggested that we first proceed with bronchoscopy with endobronchial ultrasound and attempt  needle biopsy of lymph nodes in this manner, and possible mediastinoscopy.  If we get no adequate nodal tissue for diagnosis we can come back later and do a left video-assisted thoracoscopy and biopsy left AP window nodes.  The patient would like to think about this over the weekend and will call the office back if she wishes to proceed with bronchoscopy ebus the possible mediastinoscopy as a first procedure.  I explained to her that without a tissue diagnosis we will not be able to determine how to treat this, as it could be lymphoma, carcinoma or sarcoid, and neither the PET scan nor the CT scan can definitively differentiate the diagnosis.    Delight Ovens MD      [address] [place] [place] [address] Office [phone]   Beeper [phone]  05/06/2017 5:23 PM

## 2017-05-10 ENCOUNTER — Other Ambulatory Visit: Payer: Self-pay

## 2017-05-10 DIAGNOSIS — R590 Localized enlarged lymph nodes: Secondary | ICD-10-CM

## 2017-05-11 ENCOUNTER — Encounter (HOSPITAL_COMMUNITY): Payer: 59

## 2017-05-13 ENCOUNTER — Encounter: Payer: 59 | Admitting: Cardiothoracic Surgery

## 2017-05-14 ENCOUNTER — Encounter (HOSPITAL_COMMUNITY): Payer: Self-pay

## 2017-05-14 ENCOUNTER — Other Ambulatory Visit: Payer: Self-pay

## 2017-05-14 ENCOUNTER — Encounter (HOSPITAL_COMMUNITY)
Admission: RE | Admit: 2017-05-14 | Discharge: 2017-05-14 | Disposition: A | Discharge status: Home / Self Care | Payer: 59 | Source: Ambulatory Visit | Attending: Cardiothoracic Surgery | Admitting: Cardiothoracic Surgery

## 2017-05-14 DIAGNOSIS — Z887 Allergy status to serum and vaccine status: Secondary | ICD-10-CM | POA: Diagnosis not present

## 2017-05-14 DIAGNOSIS — Z8249 Family history of ischemic heart disease and other diseases of the circulatory system: Secondary | ICD-10-CM | POA: Diagnosis not present

## 2017-05-14 DIAGNOSIS — K589 Irritable bowel syndrome without diarrhea: Secondary | ICD-10-CM | POA: Diagnosis not present

## 2017-05-14 DIAGNOSIS — Z888 Allergy status to other drugs, medicaments and biological substances status: Secondary | ICD-10-CM | POA: Diagnosis not present

## 2017-05-14 DIAGNOSIS — F419 Anxiety disorder, unspecified: Secondary | ICD-10-CM | POA: Diagnosis not present

## 2017-05-14 DIAGNOSIS — Z882 Allergy status to sulfonamides status: Secondary | ICD-10-CM | POA: Diagnosis not present

## 2017-05-14 DIAGNOSIS — I10 Essential (primary) hypertension: Secondary | ICD-10-CM | POA: Diagnosis not present

## 2017-05-14 DIAGNOSIS — Z880 Allergy status to penicillin: Secondary | ICD-10-CM | POA: Diagnosis not present

## 2017-05-14 DIAGNOSIS — E559 Vitamin D deficiency, unspecified: Secondary | ICD-10-CM | POA: Diagnosis not present

## 2017-05-14 DIAGNOSIS — K219 Gastro-esophageal reflux disease without esophagitis: Secondary | ICD-10-CM | POA: Diagnosis not present

## 2017-05-14 DIAGNOSIS — Z79899 Other long term (current) drug therapy: Secondary | ICD-10-CM | POA: Diagnosis not present

## 2017-05-14 DIAGNOSIS — Z881 Allergy status to other antibiotic agents status: Secondary | ICD-10-CM | POA: Diagnosis not present

## 2017-05-14 DIAGNOSIS — R590 Localized enlarged lymph nodes: Secondary | ICD-10-CM | POA: Diagnosis present

## 2017-05-14 DIAGNOSIS — M797 Fibromyalgia: Secondary | ICD-10-CM | POA: Diagnosis not present

## 2017-05-14 HISTORY — DX: Headache, unspecified: R51.9

## 2017-05-14 HISTORY — DX: Anxiety disorder, unspecified: F41.9

## 2017-05-14 HISTORY — DX: Headache: R51

## 2017-05-14 LAB — COMPREHENSIVE METABOLIC PANEL
ALT: 19 U/L (ref 14–54)
AST: 22 U/L (ref 15–41)
Albumin: 4.2 g/dL (ref 3.5–5.0)
Alkaline Phosphatase: 97 U/L (ref 38–126)
Anion gap: 8 (ref 5–15)
BUN: 20 mg/dL (ref 6–20)
CO2: 25 mmol/L (ref 22–32)
Calcium: 9.5 mg/dL (ref 8.9–10.3)
Chloride: 104 mmol/L (ref 101–111)
Creatinine, Ser: 0.74 mg/dL (ref 0.44–1.00)
GFR calc Af Amer: >60 mL/min (ref >60)
GFR calc non Af Amer: >60 mL/min (ref >60)
Glucose, Bld: 88 mg/dL (ref 65–99)
Potassium: 3.8 mmol/L (ref 3.5–5.1)
Sodium: 137 mmol/L (ref 135–145)
Total Bilirubin: 0.6 mg/dL (ref 0.3–1.2)
Total Protein: 7.3 g/dL (ref 6.5–8.1)

## 2017-05-14 LAB — APTT: aPTT: 32 seconds (ref 24–36)

## 2017-05-14 LAB — CBC
HCT: 43.4 % (ref 36.0–46.0)
Hemoglobin: 14.5 g/dL (ref 12.0–15.0)
MCH: 28.5 pg (ref 26.0–34.0)
MCHC: 33.4 g/dL (ref 30.0–36.0)
MCV: 85.3 fL (ref 78.0–100.0)
Platelets: 216 10*3/uL (ref 150–400)
RBC: 5.09 MIL/uL (ref 3.87–5.11)
RDW: 12.7 % (ref 11.5–15.5)
WBC: 7.7 10*3/uL (ref 4.0–10.5)

## 2017-05-14 LAB — SURGICAL PCR SCREEN
MRSA, PCR: NEGATIVE
Staphylococcus aureus: NEGATIVE

## 2017-05-14 LAB — PROTIME-INR
INR: 1.02
Prothrombin Time: 13.3 seconds (ref 11.4–15.2)

## 2017-05-14 LAB — ABO/RH: ABO/RH(D): A POS

## 2017-05-14 MED ORDER — CHLORHEXIDINE GLUCONATE CLOTH 2 % EX PADS: 6.0000 | MEDICATED_PAD | Freq: Once | CUTANEOUS | Status: DC

## 2017-05-14 NOTE — Pre-Procedure Instructions (Signed)
    Cherolyn H Parrott  05/14/2017      [id number] - [place], [place] - [address] [place] [id number] Phone: [phone] Fax: [phone]    Your procedure is scheduled on 05/17/17.  [facility] at 10 [facility].M.  Call this number if you have problems the morning of surgery:  [phone]   Remember:  Do not eat food or drink liquids after midnight.  Take these medicines the morning of surgery with A SIP OF WATER --tylenol,clartin   Do not wear jewelry, make-up or nail polish.  Do not wear lotions, powders, or perfumes, or deoderant.  Do not shave 48 hours prior to surgery.  Men may shave face and neck.  Do not bring valuables to the hospital.  [facility] is not responsible for any belongings or valuables.  Contacts, dentures or bridgework may not be worn into surgery.  Leave your suitcase in the car.  After surgery it may be brought to your room.  For patients admitted to the hospital, discharge time will be determined by your treatment team.  Patients discharged the day of surgery will not be allowed to drive home.   Name and phone number of your driver:   Special instructions:   Please read over the following fact sheets that you were given. MRSA Information Do not take any aspirin,anti-inflammatories,vitamins,or herbal supplements 5-7 days prior to surgery.

## 2017-05-17 ENCOUNTER — Encounter (HOSPITAL_COMMUNITY): Payer: Self-pay | Admitting: Certified Registered Nurse Anesthetist

## 2017-05-17 ENCOUNTER — Ambulatory Visit (HOSPITAL_COMMUNITY): Payer: 59 | Admitting: Vascular Surgery

## 2017-05-17 ENCOUNTER — Ambulatory Visit (HOSPITAL_COMMUNITY)
Admission: RE | Admit: 2017-05-17 | Discharge: 2017-05-17 | Disposition: A | Discharge status: Home / Self Care | Payer: 59 | Source: Ambulatory Visit | Attending: Cardiothoracic Surgery | Admitting: Cardiothoracic Surgery

## 2017-05-17 ENCOUNTER — Ambulatory Visit (HOSPITAL_COMMUNITY): Payer: 59 | Admitting: Certified Registered Nurse Anesthetist

## 2017-05-17 ENCOUNTER — Encounter (HOSPITAL_COMMUNITY)
Admission: RE | Disposition: A | Discharge status: Home / Self Care | Payer: Self-pay | Source: Ambulatory Visit | Attending: Cardiothoracic Surgery

## 2017-05-17 DIAGNOSIS — Z880 Allergy status to penicillin: Secondary | ICD-10-CM | POA: Insufficient documentation

## 2017-05-17 DIAGNOSIS — F419 Anxiety disorder, unspecified: Secondary | ICD-10-CM | POA: Insufficient documentation

## 2017-05-17 DIAGNOSIS — R590 Localized enlarged lymph nodes: Secondary | ICD-10-CM

## 2017-05-17 DIAGNOSIS — Z882 Allergy status to sulfonamides status: Secondary | ICD-10-CM | POA: Insufficient documentation

## 2017-05-17 DIAGNOSIS — K589 Irritable bowel syndrome without diarrhea: Secondary | ICD-10-CM | POA: Insufficient documentation

## 2017-05-17 DIAGNOSIS — E559 Vitamin D deficiency, unspecified: Secondary | ICD-10-CM | POA: Insufficient documentation

## 2017-05-17 DIAGNOSIS — Z8249 Family history of ischemic heart disease and other diseases of the circulatory system: Secondary | ICD-10-CM | POA: Insufficient documentation

## 2017-05-17 DIAGNOSIS — M797 Fibromyalgia: Secondary | ICD-10-CM | POA: Insufficient documentation

## 2017-05-17 DIAGNOSIS — Z79899 Other long term (current) drug therapy: Secondary | ICD-10-CM | POA: Insufficient documentation

## 2017-05-17 DIAGNOSIS — K219 Gastro-esophageal reflux disease without esophagitis: Secondary | ICD-10-CM | POA: Insufficient documentation

## 2017-05-17 DIAGNOSIS — Z887 Allergy status to serum and vaccine status: Secondary | ICD-10-CM | POA: Insufficient documentation

## 2017-05-17 DIAGNOSIS — Z881 Allergy status to other antibiotic agents status: Secondary | ICD-10-CM | POA: Insufficient documentation

## 2017-05-17 DIAGNOSIS — I10 Essential (primary) hypertension: Secondary | ICD-10-CM | POA: Insufficient documentation

## 2017-05-17 DIAGNOSIS — Z888 Allergy status to other drugs, medicaments and biological substances status: Secondary | ICD-10-CM | POA: Insufficient documentation

## 2017-05-17 HISTORY — PX: VIDEO BRONCHOSCOPY WITH ENDOBRONCHIAL ULTRASOUND: SHX6177

## 2017-05-17 SURGERY — BRONCHOSCOPY, WITH EBUS
Anesthesia: General

## 2017-05-17 MED ORDER — ROCURONIUM BROMIDE 100 MG/10ML IV SOLN
INTRAVENOUS | Status: DC | PRN
  Administered 2017-05-17: 50 mg via INTRAVENOUS
  Administered 2017-05-17: 20 mg via INTRAVENOUS

## 2017-05-17 MED ORDER — MIDAZOLAM HCL 2 MG/2ML IJ SOLN
INTRAMUSCULAR | Status: AC
  Filled 2017-05-17: qty 2

## 2017-05-17 MED ORDER — EPHEDRINE 5 MG/ML INJ
INTRAVENOUS | Status: AC
Start: 2017-05-17 — End: ?
  Filled 2017-05-17: qty 10

## 2017-05-17 MED ORDER — EPINEPHRINE PF 1 MG/ML IJ SOLN
INTRAMUSCULAR | Status: AC
  Filled 2017-05-17: qty 1

## 2017-05-17 MED ORDER — PROPOFOL 10 MG/ML IV BOLUS
INTRAVENOUS | Status: AC
  Filled 2017-05-17: qty 20

## 2017-05-17 MED ORDER — PHENYLEPHRINE 40 MCG/ML (10ML) SYRINGE FOR IV PUSH (FOR BLOOD PRESSURE SUPPORT)
PREFILLED_SYRINGE | INTRAVENOUS | Status: DC | PRN
  Administered 2017-05-17: 40 ug via INTRAVENOUS

## 2017-05-17 MED ORDER — SUGAMMADEX SODIUM 200 MG/2ML IV SOLN
INTRAVENOUS | Status: DC | PRN
  Administered 2017-05-17: 200 mg via INTRAVENOUS

## 2017-05-17 MED ORDER — PHENYLEPHRINE HCL 10 MG/ML IJ SOLN
INTRAVENOUS | Status: DC | PRN
  Administered 2017-05-17: 10 ug/min via INTRAVENOUS

## 2017-05-17 MED ORDER — PROPOFOL 10 MG/ML IV BOLUS
INTRAVENOUS | Status: DC | PRN
  Administered 2017-05-17: 150 mg via INTRAVENOUS

## 2017-05-17 MED ORDER — MIDAZOLAM HCL 5 MG/5ML IJ SOLN
INTRAMUSCULAR | Status: DC | PRN
  Administered 2017-05-17: 2 mg via INTRAVENOUS

## 2017-05-17 MED ORDER — OXYCODONE HCL 5 MG PO TABS: 5.0000 mg | ORAL_TABLET | Freq: Once | ORAL | Status: DC | PRN

## 2017-05-17 MED ORDER — ONDANSETRON HCL 4 MG/2ML IJ SOLN
INTRAMUSCULAR | Status: AC
  Filled 2017-05-17: qty 2

## 2017-05-17 MED ORDER — ONDANSETRON HCL 4 MG/2ML IJ SOLN
INTRAMUSCULAR | Status: DC | PRN
  Administered 2017-05-17: 4 mg via INTRAVENOUS

## 2017-05-17 MED ORDER — LIDOCAINE HCL (CARDIAC) 20 MG/ML IV SOLN
INTRAVENOUS | Status: DC | PRN
  Administered 2017-05-17: 60 mg via INTRAVENOUS

## 2017-05-17 MED ORDER — HYDROMORPHONE HCL 1 MG/ML IJ SOLN: 0.2500 mg | INTRAMUSCULAR | Status: DC | PRN

## 2017-05-17 MED ORDER — ONDANSETRON HCL 4 MG/2ML IJ SOLN: 4.0000 mg | Freq: Once | INTRAMUSCULAR | Status: DC | PRN

## 2017-05-17 MED ORDER — LACTATED RINGERS IV SOLN
INTRAVENOUS | Status: DC
  Administered 2017-05-17 (×2): via INTRAVENOUS
  Administered 2017-05-17: 50 mL/h via INTRAVENOUS

## 2017-05-17 MED ORDER — FENTANYL CITRATE (PF) 100 MCG/2ML IJ SOLN
INTRAMUSCULAR | Status: DC | PRN
  Administered 2017-05-17 (×2): 50 ug via INTRAVENOUS
  Administered 2017-05-17: 100 ug via INTRAVENOUS

## 2017-05-17 MED ORDER — FENTANYL CITRATE (PF) 250 MCG/5ML IJ SOLN
INTRAMUSCULAR | Status: AC
  Filled 2017-05-17: qty 5

## 2017-05-17 MED ORDER — FENTANYL CITRATE (PF) 250 MCG/5ML IJ SOLN
INTRAMUSCULAR | Status: AC
Start: 2017-05-17 — End: ?
  Filled 2017-05-17: qty 5

## 2017-05-17 MED ORDER — DEXAMETHASONE SODIUM PHOSPHATE 10 MG/ML IJ SOLN
INTRAMUSCULAR | Status: DC | PRN
  Administered 2017-05-17: 10 mg via INTRAVENOUS

## 2017-05-17 MED ORDER — SUCCINYLCHOLINE CHLORIDE 200 MG/10ML IV SOSY
PREFILLED_SYRINGE | INTRAVENOUS | Status: AC
  Filled 2017-05-17: qty 10

## 2017-05-17 MED ORDER — OXYCODONE HCL 5 MG/5ML PO SOLN: 5.0000 mg | Freq: Once | ORAL | Status: DC | PRN

## 2017-05-17 MED ORDER — 0.9 % SODIUM CHLORIDE (POUR BTL) OPTIME
TOPICAL | Status: DC | PRN
  Administered 2017-05-17: 1000 mL

## 2017-05-17 SURGICAL SUPPLY — 51 items
BLADE SURG 10 STRL SS (BLADE) IMPLANT
BRUSH CYTOL CELLEBRITY 1.5X140 (MISCELLANEOUS) IMPLANT
CANISTER SUCT 3000ML PPV (MISCELLANEOUS) ×2 IMPLANT
CLIP VESOCCLUDE MED 6/CT (CLIP) IMPLANT
CONT SPEC 4OZ CLIKSEAL STRL BL (MISCELLANEOUS) ×6 IMPLANT
COVER BACK TABLE 60X90IN (DRAPES) ×2 IMPLANT
COVER DOME SNAP 22 D (MISCELLANEOUS) IMPLANT
COVER SURGICAL LIGHT HANDLE (MISCELLANEOUS) ×2 IMPLANT
DERMABOND ADVANCED (GAUZE/BANDAGES/DRESSINGS)
DERMABOND ADVANCED .7 DNX12 (GAUZE/BANDAGES/DRESSINGS) IMPLANT
DRAPE LAPAROTOMY T 102X78X121 (DRAPES) IMPLANT
DRSG AQUACEL AG ADV 3.5X14 (GAUZE/BANDAGES/DRESSINGS) IMPLANT
ELECT CAUTERY BLADE 6.4 (BLADE) IMPLANT
ELECT REM PT RETURN 9FT ADLT (ELECTROSURGICAL)
ELECTRODE REM PT RTRN 9FT ADLT (ELECTROSURGICAL) IMPLANT
FORCEPS BIOP RJ4 1.8 (CUTTING FORCEPS) IMPLANT
GAUZE SPONGE 4X4 12PLY STRL (GAUZE/BANDAGES/DRESSINGS) ×2 IMPLANT
GAUZE SPONGE 4X4 16PLY XRAY LF (GAUZE/BANDAGES/DRESSINGS) IMPLANT
GLOVE BIO SURGEON STRL SZ 6.5 (GLOVE) ×4 IMPLANT
GOWN STRL REUS W/ TWL LRG LVL3 (GOWN DISPOSABLE) ×2 IMPLANT
GOWN STRL REUS W/TWL LRG LVL3 (GOWN DISPOSABLE) ×2
HEMOSTAT SURGICEL 2X14 (HEMOSTASIS) IMPLANT
KIT BASIN OR (CUSTOM PROCEDURE TRAY) ×2 IMPLANT
KIT CLEAN ENDO COMPLIANCE (KITS) ×4 IMPLANT
KIT ROOM TURNOVER OR (KITS) ×2 IMPLANT
MARKER SKIN DUAL TIP RULER LAB (MISCELLANEOUS) ×2 IMPLANT
NEEDLE EBUS SONO TIP PENTAX (NEEDLE) ×2 IMPLANT
NEEDLE FILTER BLUNT 18X 1/2SAF (NEEDLE)
NEEDLE FILTER BLUNT 18X1 1/2 (NEEDLE) IMPLANT
NS IRRIG 1000ML POUR BTL (IV SOLUTION) ×2 IMPLANT
OIL SILICONE PENTAX (PARTS (SERVICE/REPAIRS)) ×2 IMPLANT
PACK SURGICAL SETUP 50X90 (CUSTOM PROCEDURE TRAY) ×2 IMPLANT
PAD ARMBOARD 7.5X6 YLW CONV (MISCELLANEOUS) ×4 IMPLANT
PENCIL BUTTON HOLSTER BLD 10FT (ELECTRODE) IMPLANT
SPONGE INTESTINAL PEANUT (DISPOSABLE) IMPLANT
STAPLER VISISTAT 35W (STAPLE) IMPLANT
SUT VIC AB 3-0 SH 18 (SUTURE) IMPLANT
SUT VICRYL 4-0 PS2 18IN ABS (SUTURE) IMPLANT
SWAB COLLECTION DEVICE MRSA (MISCELLANEOUS) IMPLANT
SWAB CULTURE ESWAB REG 1ML (MISCELLANEOUS) IMPLANT
SYR 10ML LL (SYRINGE) ×2 IMPLANT
SYR 20CC LL (SYRINGE) ×2 IMPLANT
SYR 20ML ECCENTRIC (SYRINGE) ×2 IMPLANT
TOWEL GREEN STERILE (TOWEL DISPOSABLE) ×2 IMPLANT
TOWEL GREEN STERILE FF (TOWEL DISPOSABLE) ×2 IMPLANT
TOWEL OR 17X24 6PK STRL BLUE (TOWEL DISPOSABLE) IMPLANT
TOWEL OR 17X26 10 PK STRL BLUE (TOWEL DISPOSABLE) IMPLANT
TRAP SPECIMEN MUCOUS 40CC (MISCELLANEOUS) ×2 IMPLANT
TUBE CONNECTING 12X1/4 (SUCTIONS) ×2 IMPLANT
TUBE CONNECTING 20X1/4 (TUBING) ×2 IMPLANT
WATER STERILE IRR 1000ML POUR (IV SOLUTION) ×4 IMPLANT

## 2017-05-17 NOTE — Brief Op Note (Signed)
      [address]       [place], [id number]             [phone]      05/17/2017  3:36 PM  PATIENT:  Kristin [name]  51 y.o. female  PRE-OPERATIVE DIAGNOSIS:  Mediastinal adenopathy, bilaterail Hilar adenopathy  POST-OPERATIVE DIAGNOSIS:  Same, path pending   PROCEDURE:  Procedure(s): VIDEO BRONCHOSCOPY WITH ENDOBRONCHIAL ULTRASOUND WITH TRANSBRONCHIAL BX OF LYMPH NODES 7, 10 R AND 10 L. (N/A)  SURGEON:  Surgeon(s) and Role:    * Delight OvensGerhardt, Reedy Biernat B, MD - Primary    ANESTHESIA:   general  EBL:  25 mL   BLOOD ADMINISTERED:none  DRAINS: none   LOCAL MEDICATIONS USED:  NONE  SPECIMEN:  Source of Specimen:  # 7, 10 L , 10 R   DISPOSITION OF SPECIMEN:  PATHOLOGY  COUNTS:  YES  DICTATION: .Dragon Dictation  PLAN OF CARE: Discharge to home after PACU  PATIENT DISPOSITION:  PACU - hemodynamically stable.   Delay start of Pharmacological VTE agent (>24hrs) due to surgical blood loss or risk of bleeding: yes

## 2017-05-17 NOTE — Anesthesia Postprocedure Evaluation (Signed)
Anesthesia Post Note  Patient: Kristin [name]  Procedure(s) Performed: VIDEO BRONCHOSCOPY WITH ENDOBRONCHIAL ULTRASOUND WITH TRANSBRONCHIAL BX OF LYMPH NODES 7, 10 R AND 10 L. (N/A )     Patient location during evaluation: PACU Anesthesia Type: General Level of consciousness: awake and alert Pain management: pain level controlled Vital Signs Assessment: post-procedure vital signs reviewed and stable Respiratory status: spontaneous breathing, nonlabored ventilation, respiratory function stable and patient connected to nasal cannula oxygen Cardiovascular status: blood pressure returned to baseline and stable Postop Assessment: no apparent nausea or vomiting Anesthetic complications: no    Last Vitals:  Vitals:   05/17/17 1645 05/17/17 1700  BP: (!) 166/93 (!) 155/85  Pulse: (!) 105 100  Resp: 18 15  Temp:  36.7 C  SpO2: 98% 96%    Last Pain:  Vitals:   05/17/17 1019  TempSrc: Oral                 Shelton SilvasKevin D Marty Uy

## 2017-05-17 NOTE — Discharge Instructions (Signed)
Flexible Bronchoscopy, Care After °This sheet gives you information about how to care for yourself after your procedure. Your health care provider may also give you more specific instructions. If you have problems or questions, contact your health care provider. °What can I expect after the procedure? °After the procedure, it is common to have the following symptoms for 24-48 hours: °· A cough that is worse than it was before the procedure. °· A low-grade fever. °· A sore throat or hoarse voice. °· Small streaks of blood in the mucus from your lungs (sputum), if tissue samples were removed (biopsy). ° °Follow these instructions at home: °Eating and drinking °· Do not eat or drink anything (including water) for 2 hours after your procedure, or until your numbing medicine (local anesthetic) has worn off. Having a numb throat increases your risk of burning yourself or choking. °· After your numbness is gone and your cough and gag reflexes have returned, you may start eating only soft foods and slowly drinking liquids. °· The day after the procedure, return to your normal diet. °Driving °· Do not drive for 24 hours if you were given a medicine to help you relax (sedative). °· Do not drive or use heavy machinery while taking prescription pain medicine. °General instructions °· Take over-the-counter and prescription medicines only as told by your health care provider. °· Return to your normal activities as told by your health care provider. Ask your health care provider what activities are safe for you. °· Do not use any products that contain nicotine or tobacco, such as cigarettes and e-cigarettes. If you need help quitting, ask your health care provider. °· Keep all follow-up visits as told by your health care provider. This is important, especially if you had a biopsy taken. °Get help right away if: °· You have shortness of breath that gets worse. °· You become light-headed or feel like you might faint. °· You have  chest pain. °· You cough up more than a small amount of blood. °· The amount of blood you cough up increases. °Summary °· Common symptoms in the 24-48 hours following a flexible bronchoscopy include cough, low-grade fever, sore throat or hoarse voice, and blood-streaked mucus from the lungs (if you had a biopsy). °· Do not eat or drink anything (including water) for 2 hours after your procedure, or until your local anesthetic has worn off. You can return to your normal diet the day after the procedure. °· Get help right away if you develop worsening shortness of breath, have chest pain, become light-headed, or cough up more than a small amount of blood. °This information is not intended to replace advice given to you by your health care provider. Make sure you discuss any questions you have with your health care provider. °Document Released: 01/02/2005 Document Revised: 07/03/2016 Document Reviewed: 07/03/2016 °Elsevier Interactive Patient Education © 2017 [employer] ° °

## 2017-05-17 NOTE — Anesthesia Procedure Notes (Signed)
Procedure Name: Intubation Date/Time: 05/17/2017 2:31 PM Performed by: Elliot DallyHuggins, Teyon Odette, CRNA Pre-anesthesia Checklist: Patient identified, Emergency Drugs available, Suction available and Patient being monitored Patient Re-evaluated:Patient Re-evaluated prior to induction Oxygen Delivery Method: Circle System Utilized Preoxygenation: Pre-oxygenation with 100% oxygen Induction Type: IV induction Ventilation: Mask ventilation without difficulty Laryngoscope Size: Miller and 2 Grade View: Grade I Tube type: Oral Tube size: 8.5 mm Number of attempts: 1 Airway Equipment and Method: Stylet and Oral airway Placement Confirmation: ETT inserted through vocal cords under direct vision,  positive ETCO2 and breath sounds checked- equal and bilateral Secured at: 21 cm Tube secured with: Tape Dental Injury: Teeth and Oropharynx as per pre-operative assessment

## 2017-05-17 NOTE — Anesthesia Preprocedure Evaluation (Addendum)
Anesthesia Evaluation  Patient identified by MRN, date of birth, ID band Patient awake    Reviewed: Allergy & Precautions, NPO status , Patient's Chart, lab work & pertinent test results  Airway Mallampati: II  TM Distance: >3 FB Neck ROM: Full    Dental  (+) Teeth Intact, Dental Advisory Given   Pulmonary neg pulmonary ROS,    breath sounds clear to auscultation       Cardiovascular hypertension, Pt. on medications  Rhythm:Regular Rate:Normal     Neuro/Psych  Headaches, Anxiety  Neuromuscular disease    GI/Hepatic Neg liver ROS, GERD  ,  Endo/Other  negative endocrine ROS  Renal/GU negative Renal ROS     Musculoskeletal  (+) Fibromyalgia -  Abdominal   Peds  Hematology negative hematology ROS (+)   Anesthesia Other Findings Day of surgery medications reviewed with the patient.  Reproductive/Obstetrics                            Anesthesia Physical Anesthesia Plan  ASA: II  Anesthesia Plan: General   Post-op Pain Management:    Induction: Intravenous  PONV Risk Score and Plan: 4 or greater and Ondansetron, Dexamethasone and Midazolam  Airway Management Planned: Oral ETT  Additional Equipment:   Intra-op Plan:   Post-operative Plan: Extubation in OR  Informed Consent: I have reviewed the patients History and Physical, chart, labs and discussed the procedure including the risks, benefits and alternatives for the proposed anesthesia with the patient or authorized representative who has indicated his/her understanding and acceptance.   Dental advisory given  Plan Discussed with: CRNA  Anesthesia Plan Comments: (Bilateral pulse ox due to low likelihood of mediastinoscopy. )       Anesthesia Quick Evaluation

## 2017-05-17 NOTE — Transfer of Care (Signed)
Immediate Anesthesia Transfer of Care Note  Patient: Kristin [name]  Procedure(s) Performed: VIDEO BRONCHOSCOPY WITH ENDOBRONCHIAL ULTRASOUND WITH TRANSBRONCHIAL BX OF LYMPH NODES 7, 10 R AND 10 L. (N/A )  Patient Location: PACU  Anesthesia Type:General  Level of Consciousness: awake, alert  and oriented  Airway & Oxygen Therapy: Patient Spontanous Breathing and Patient connected to nasal cannula oxygen  Post-op Assessment: Report given to RN and Post -op Vital signs reviewed and stable  Post vital signs: Reviewed and stable  Last Vitals:  Vitals:   05/17/17 1019 05/17/17 1036  BP: (!) 168/111 (!) 160/95  Pulse: (!) 109   Resp: 18   Temp: 36.9 C   SpO2: 100%     Last Pain:  Vitals:   05/17/17 1019  TempSrc: Oral         Complications: No apparent anesthesia complications

## 2017-05-17 NOTE — H&P (Signed)
[address]       [place] [address]             [phone]                    Kristin [name] [facility] Medical Record #[id number] Date of Birth: 1965-08-24  Referring: No ref. provider found Primary Care: Kristin Canter, MD  Chief Complaint:    Mediastinal adenopathy   History of Present Illness:    Kristin [name] 51 y.o. female previously seen in the office for 9-10 months of left face swelling.  She has a history of goiter with a thyroid biopsy done in 2013.  She has had ENT and dental evaluation for the face swelling.  She denies any history of fever chills night sweats, weight has been stable and 157 pounds.  The patient had a CT scan of the neck done at [facility] imaging report dated 04/24/2017, type report notes marked lymphadenopathy tumor mass-effect circum-the left lateral aortic arch there is lymphadenopathy in the precarinal and paratracheal areas   Patient had a PET scan to fully evaluate her lymph node status-and to make a decision about the most appropriate biopsy method  Family history is significant for an identical twin sister who has asthma and hypertension. Current Activity/ Functional Status:  Patient is independent with mobility/ambulation, transfers, ADL's, IADL's.   Zubrod Score: At the time of surgery this patient's most appropriate activity status/level should be described as: [x]     0    Normal activity, no symptoms []     1    Restricted in physical strenuous activity but ambulatory, able to do out light work []     2    Ambulatory and capable of self care, unable to do work activities, up and about               >50 % of waking hours                              []     3    Only limited self care, in bed greater than 50% of waking hours []     4    Completely disabled, no self care, confined to bed or chair []     5    Moribund   Past Medical History:  Diagnosis Date  . Anxiety   . Dizziness   . Fibromyalgia 04/13/2017  . GERD  (gastroesophageal reflux disease)   . Headache   . History of migraine headaches   . Hypertension    hx arrhythmia  . IBS (irritable bowel syndrome)   . Lymphadenopathy   . Seasonal allergies   . Vertigo   . Vitamin D deficiency     Past Surgical History:  Procedure Laterality Date  . DILATION AND CURETTAGE OF UTERUS  2006, 2009  . EYE SURGERY Left 1997   LASER  . THYOID NODULE  05/2009   FNA...DR. Richardson Landry .Marland KitchenMarland KitchenMarland KitchenNEG  . VAGINAL HYSTERECTOMY  12/2017   LAPAROSCOPICALLY ASSISTED/DR. ELIZABETH SKINNER    Family History  Problem Relation Age of Onset  . Hypertension Mother   . Diabetes Mother   . Hyperthyroidism Mother   . Hypertension Father   . Heart disease Father   . Hypothyroidism Father   . Cardiomyopathy Father   . Other Father 32       CAUSE OF DEATH..CEREBRAL HEMORR  . Cardiomyopathy Paternal  Uncle   . Heart disease Paternal Uncle        CABG  . Diabetes Maternal Grandmother   . Hypertension Maternal Grandmother   . Heart disease Maternal Grandmother        MI  . Other Maternal Grandmother        GRAVES DISEASE  . Cardiomyopathy Maternal Grandmother   . Other Maternal Grandfather        AAA  . Cardiomyopathy Maternal Grandfather   . Heart disease Maternal Grandfather        MI  . Cancer Paternal Grandmother        BREAST, OTHER UNSPECIFIED SITE  . Hypertension Paternal Grandfather   . Stroke Paternal Grandfather        ISCHEMIC STROKE  . Other Maternal Aunt        THYROID DISEASE  . Heart disease Paternal Aunt        CABG  . Cardiomyopathy Paternal Aunt   . Cancer Paternal Aunt        STOMACH  . Other Cousin        THYROID     Social History   Socioeconomic History  . Marital status: Single    Spouse name: Not on file  .    .    .    Social Needs  .    .    .    .    .    Occupational History  t  teaches elementary school  Tobacco Use  . Smoking status: Never Smoker  . Smokeless tobacco: Never Used  Substance and Sexual  Activity  . Alcohol use: No  . Drug use: No  . Sexual activity: No    Partners: Male  Other Topics Concern  . Not on file  Social History Narrative  . Not on file    Social History   Tobacco Use  Smoking Status Never Smoker  Smokeless Tobacco Never Used    Social History   Substance and Sexual Activity  Alcohol Use No     Allergies  Allergen Reactions  . Amoxicillin Hives    Has patient had a PCN reaction causing immediate rash, facial/tongue/throat swelling, SOB or lightheadedness with hypotension: Unknown Has patient had a PCN reaction causing severe rash involving mucus membranes or skin necrosis: No Has patient had a PCN reaction that required hospitalization: No Has patient had a PCN reaction occurring within the last 10 years: Yes If all of the above answers are "NO", then may proceed with Cephalosporin use.   . Azithromycin Other (See Comments)    UPSET STOMACH   . Bentyl [Dicyclomine Hcl] Diarrhea  . Doxycycline Hives  . Erythromycin Other (See Comments)    STOMACH CRAMPS  . Indocin [Indomethacin] Hives  . Lansoprazole Other (See Comments)    HEADACHE  . Levaquin [Levofloxacin] Hives  . Nexium [Esomeprazole] Hives and Other (See Comments)    HEADACHE   . Probiotic [Acidophilus] Hives  . Sudafed [Pseudoephedrine Hcl] Other (See Comments)    FAST PULSE  . Tetanus Toxoids Other (See Comments)    LOCAL REACTION...REDNESS/KNOT  . Keflex [Cephalexin] Rash  . Sulfamethoxazole Rash    Current Facility-Administered Medications  Medication Dose Route Frequency Provider Last Rate Last Dose  . lactated ringers infusion   Intravenous Continuous Delight Ovens, MD 50 mL/hr at 05/17/17 1050 50 mL/hr at 05/17/17 1050    Pertinent items are noted in HPI.   Review of Systems:     Cardiac  Review of Systems: Y or N  Chest Pain [ N   ]  Resting SOB [ N] Exertional SOB  Klaus.Mock[N  ]  Pollyann Kennedyrthopnea Klaus.Mock[N  ]   Pedal Edema Klaus.Mock[N  ]    Palpitations Klaus.Mock[N ] SN ]   Presyncope [ N   ]  General Review of Systems: [Y] = yes [  ]=no Constitional: recent weight change Klaus.Mock[N  ];  Wt loss over the last 3 months [   ] anorexia [  ]; fatigue [  ]; nausea [  ]; night sweats [ N ]; fever [ N ]; or chills N[  ];          Dental: poor dentition[N  ]; Last Dentist visit:   Eye : blurred vision [  ]; diplopia [   ]; vision changes [  ];  Amaurosis fugax[  ]; Resp: cough Klaus.Mock[N  ];  wheezing[N;  hemoptysis[ N ]; shortness of breath[ N ]; paroxysmal nocturnal dyspnea[ N]; dyspnea on exertion[  ]; or orthopnea[  ];  GI:  gallstones[  ], vomiting[  ];  dysphagia[  ]; melena[  ];  hematochezia [  ]; heartburn[  ];   Hx of  Colonoscopy[  ]; GU: kidney stones [  ]; hematuria[  ];   dysuria [  ];  nocturia[  ];  history of     obstruction [  ]; urinary frequency [  ]             Skin: rash, swelling[  ];, hair loss[  ];  peripheral edema[  ];  or itching[  ]; Musculosketetal: myalgias[  ];  joint swelling[  ];  joint erythema[  ];  joint pain[  ];  back pain[  ];  Heme/Lymph: bruising[  ];  bleeding[  ];  anemia[  ];  Neuro: TIA[  ];  headaches[  ];  stroke[  ];  vertigo[  ];  seizures[  ];   paresthesias[  ];  difficulty walking[  ];  Psych:depression[  ]; anxiety[  ];  Endocrine: diabetes[ N ];  thyroid dysfunction[Y  ];  Immunizations: Flu up to date [ Y ]; Pneumococcal up to date [ Y ];  Other:  Physical Exam: BP (!) 160/95   Pulse (!) 109   Temp 98.4 F (36.9 C) (Oral)   Resp 18   Wt 153 lb (69.4 kg)   SpO2 100%   BMI 22.59 kg/m   PHYSICAL EXAMINATION: Physical exam is unchanged from last week General appearance: alert, cooperative and appears stated age Head: Normocephalic, without obvious abnormality, atraumatic Neck: no adenopathy, no carotid bruit, no JVD, supple, symmetrical, trachea midline and thyroid not enlarged, symmetric, no tenderness/mass/nodules Lymph nodes: Cervical, supraclavicular, and axillary nodes normal. and right supuclavicular fossa fills full, no definate node   Resp: clear to auscultation bilaterally Back: symmetric, no curvature. ROM normal. No CVA tenderness. Cardio: regular rate and rhythm, S1, S2 normal, no murmur, click, rub or gallop GI: soft, non-tender; bowel sounds normal; no masses,  no organomegaly Extremities: extremities normal, atraumatic, no cyanosis or edema and Homans sign is negative, no sign of DVT Neurologic: Grossly normal  Diagnostic Studies & Laboratory data:  Dg Chest 2 View  Result Date: 05/14/2017 CLINICAL DATA:  Preoperative respiratory evaluation prior to bronchoscopy and possible mediastinoscopy, to be performed because of bilateral hilar and mediastinal lymphadenopathy, hypermetabolic on recent PET-CT. EXAM: CHEST  2 VIEW COMPARISON:  PET-CT 05/05/2017. Chest x-rays 01/20/2009 and earlier. FINDINGS: Cardiac silhouette normal  in size. Thoracic aorta normal in appearance. AP window lymphadenopathy identified on the PET-CT causes a bulge in the mediastinal contour. Hilar and mediastinal contours otherwise unremarkable. Lungs clear. Bronchovascular markings normal. Pulmonary vascularity normal. No visible pleural effusions. No pneumothorax. Visualized bony thorax intact. IMPRESSION: 1.  No acute cardiopulmonary disease. 2. Lymphadenopathy in the AP window region of the mediastinum. Electronically Signed   By: Hulan Saas M.D.   On: 05/14/2017 16:49    Paper report of ct scan , unable view disk she brought with her      Nm Pet Image Initial (pi) Skull Base To Thigh  Result Date: 05/05/2017 CLINICAL DATA:  Initial Treatment strategy for mediastinal adenopathy. EXAM: NUCLEAR MEDICINE PET SKULL BASE TO THIGH TECHNIQUE: 12.3 mCi F-18 FDG was injected intravenously. Full-ring PET imaging was performed from the skull base to thigh after the radiotracer. CT data was obtained and used for attenuation correction and anatomic localization. FASTING BLOOD GLUCOSE:  Value: 95 mg/dl COMPARISON:  Left-sided facial CT dated 04/24/2017  FINDINGS: NECK No hypermetabolic lymph nodes in the neck. The nodular right lobe of the thyroid gland is not hypermetabolic, which typically indicates that the nodule is benign. CHEST Hypermetabolic prevascular, AP window, right paratracheal, right hilar, left hilar, and subcarinal adenopathy in the chest. Index AP window lymph node measures 1.8 cm in short axis on image 87/4 and has maximum standard uptake value 13.2. Right hilar node approximately 1.3 cm in short axis on image 92/4 with maximum SUV 11.7. A peribronchovascular nodule in the left upper lobe is only about 5 by 7 mm in size on image 79/4 but has a maximum standard uptake value of 4.6 Centrilobular emphysema. 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 ABDOMEN/PELVIS No abnormal hypermetabolic activity within the liver, pancreas, adrenal glands, or spleen. No hypermetabolic lymph nodes in the abdomen or pelvis. SKELETON No focal hypermetabolic activity to suggest skeletal metastasis. IMPRESSION: 1. Mediastinal and bilateral hilar adenopathy is considerably hypermetabolic and suspicious for malignancy. There is a small (5 by 7 mm) hypermetabolic nodule in the left upper lobe in the peribronchovascular region. This is a very small size to represent a primary, and may simply be a peribronchovascular malignant lymph node. Lymphoma is a distinct possibility and tissue diagnosis is recommended. 2. No hypermetabolic activity associated with the nodularity in the right thyroid gland. This usually is fairly specific for benign thyroid nodule. 3. Centrilobular emphysema. 4. Aberrant right subclavian artery passes behind the esophagus. Electronically Signed   By: Gaylyn Rong M.D.   On: 05/05/2017 14:31  I have independently reviewed the above radiology studies  and reviewed the findings with the patient.    Recent Lab Findings: Lab Results  Component Value Date   WBC 7.7 05/14/2017   HGB 14.5 05/14/2017   HCT  43.4 05/14/2017   PLT 216 05/14/2017   GLUCOSE 88 05/14/2017   ALT 19 05/14/2017   AST 22 05/14/2017   NA 137 05/14/2017   K 3.8 05/14/2017   CL 104 05/14/2017   CREATININE 0.74 05/14/2017   BUN 20 05/14/2017   CO2 25 05/14/2017   INR 1.02 05/14/2017      Assessment / Plan:   CT of neck suggests significant adenopathy in the chest that will require bx.  Mediastinal and bilateral hilar adenopathy is considerably hypermetabolic Aberrant right subclavian artery passes behind the esophagus  I reviewed the PET scan in detail with the patient and pointed out the areas of mediastinal  hypermetabolic activity and mild node enlargement.  Most pronounced node enlargement is in the AP window.  I recommended to the patient that we proceed with obtaining a tissue diagnosis.  Patient is very hesitant about having any invasive procedures, I suggested that we first proceed with bronchoscopy with endobronchial ultrasound and attempt needle biopsy of lymph nodes in this manner, and possible mediastinoscopy.  If we get no adequate nodal tissue for diagnosis we can come back later and do a left video-assisted thoracoscopy and biopsy left AP window nodes.  The patient would like to think about this over the weekend and will call the office back if she wishes to proceed with bronchoscopy ebus the possible mediastinoscopy as a first procedure.  I explained to her that without a tissue diagnosis we will not be able to determine how to treat this, as it could be lymphoma, carcinoma or sarcoid, and neither the PET scan nor the CT scan can definitively differentiate the diagnosis.   Patient called back to the office and was willing to proceed with bronchoscopy with ebus to obtain a biopsy, possible mediastinoscopy.  She understands that if this does not give us a tissue diagnosis will need to return to do a [place] and/or left VATS to obtain a biopsy of the AP window node.  The goals risks and alternatives of the  planned surgical procedure Procedure(s): VIDEO BRONCHOSCOPY WITH ENDOBRONCHIAL ULTRASOUND, POSSIBLE MEDIASTINOSCOPY (N/A) MEDIASTINOSCOPY (N/A)  have been discussed with the patient in detail. The risks of the procedure including death, infection, stroke, myocardial infarction, bleeding, blood transfusion have all been discussed specifically.  I have quoted Brianca H Gilkey a 2 % of perioperative mortality and a complication rate as high as 30 %. The patient's questions have been answered.Deronda H Laing is willing  to proceed with the planned procedure. Delight OvensEdward B Tanequa Kretz MD      [address] [place] [id number] Office [phone]   Beeper [phone]  05/17/2017 11:28 AM

## 2017-05-18 ENCOUNTER — Encounter (HOSPITAL_COMMUNITY): Payer: Self-pay | Admitting: Cardiothoracic Surgery

## 2017-05-18 NOTE — Op Note (Signed)
NAME:  Kristin [name], [name]                      ACCOUNT NO.:  MEDICAL RECORD NO.:  [id number]  LOCATION:                                 FACILITY:  PHYSICIAN:  Sheliah PlaneEdward Madai Nuccio, MD         DATE OF BIRTH:  DATE OF PROCEDURE:  05/17/2017 DATE OF DISCHARGE:                              OPERATIVE REPORT   PREOPERATIVE DIAGNOSIS:  Mediastinal adenopathy.  POSTOPERATIVE DIAGNOSIS:  Mediastinal adenopathy.  SURGICAL PROCEDURE:  Bronchoscopy with endobronchial ultrasound and transbronchial biopsies of #7, 10R and 10L lymph nodes.  SURGEON:  Sheliah PlaneEdward Francesca Strome, MD  BRIEF HISTORY:  The patient is a 51 year old female with no previous history of smoking, who presented with left neck swelling. Subsequently, PET scan showed hypermetabolic mediastinal lymph nodes, some in the AP window, but also in the right and left hilar areas and in this #7 region.  With no known diagnosis, it was recommended to the patient that we obtain a tissue diagnosis.  She was reluctant to do this, but ultimately agreed to proceed with bronchoscopy and EBUS. Consideration for doing a Chamberlain left VATS to address the left periaortic lymph node.  AP window lymph node was discussed with the patient, but she was not willing to proceed with this first.  Risks and options were discussed with the patient in detail and she signed informed consent.  DESCRIPTION OF PROCEDURE:  The patient underwent general endotracheal anesthesia without incident with 8.5 French endotracheal tube.  The tube was in good position.  Appropriate time-out was performed and then we proceeded with bronchoscopy with standard 2.8-mm bronchoscope.  There were no endobronchial lesions to the subsegmental level in the left or right tracheobronchial tree.  Each of the segments was inspected.  The scope was were removed and the EBUS scope was then placed.  Although there was some slight hypermetabolic activity in the node along the 4R region, we did not  visualize the node in this area.  There were some mildly enlarged #7 nodes and multiple passes were obtained in this area with the transbronchial aspirating needle.  In addition, we passed the scope out to the 10R region and several transbronchial biopsies were done at this level in the similar fashion.  Toward the left 10L region, multiple passes were made also.  The initial quick stains reported by Pathology showed lymphoid tissue in the #7 nodes without any specific diagnosis of malignancy.  It was noted there were some clumps of histiocytes.  Both the 10L and 10R nodes were poor samples, predominantly bloody material without significant amount of lymphocytes. We then ended the procedure, removed the scope, cleared the tracheobronchial tree of all secretions.  The patient was extubated in the operating room and transferred to the recovery room for further postoperative care.  We will await the final pathology reports and if no specific diagnosis is made, encouraged the patient to proceed with left VATS Chamberlain procedure to directly approach the left periaortic nodes.  Blood loss was minimal.     Sheliah PlaneEdward Saran Laviolette, MD     EG/MEDQ  D:  05/18/2017  T:  05/18/2017  Job:  [id number]

## 2017-05-21 ENCOUNTER — Telehealth: Payer: Self-pay | Admitting: Physician Assistant

## 2017-05-21 NOTE — Telephone Encounter (Signed)
Ms. Kristin [name] is S/P EBUS with biopsy performed on 11/19.  The patient is calling concerning hemoptysis and epigastric discomfort.  She states she has been coughing up small amounts of blood since the procedure.  She knew this was expected, but states she didn't at all yesterday and coughed a small amount up this morning and was concerned it had restarted.  She also states that she is having some epigastric discomfort made worse with eating.  She denies chest pain, but states it just feels like a hand it sitting in her epigastric area.  She has a long standing history of IBS and GERD, both of which she has not been taking her medications for.  She states she stopped her Doxepin after her procedure because she was fearful it would cause constipation.  She states she is out of her Aciphex and hasn't had time to get a refill.   I explained to the patient that it is expected to cough up very small amounts of blood after her procedure.  I explained that should this continue and become increasing in amount she would require evaluation in the ED.  In regards to epigastric pain made worse by eating, with procedure performed Monday it can be expected her reflux disease can be worse especially with not taking her medications  Plan:  1. Resume Doxepin as this medication shouldn't be abruptly stopped. She is moving her bowels regularly 2. Epigastric pain.. Could be flare up with her reflux disease.Marland Kitchen. She was instructed to resume her reflux medications 3. Hemoptysis- expected post procedure   If patients symptoms do not improve with initiation of proper medication regimen and if she continues to cough up blood specifically with increase in amount she will need to present to the ED for evaluation.  She understands and is agreeable with plan

## 2017-05-24 ENCOUNTER — Ambulatory Visit: Payer: 59 | Admitting: Cardiothoracic Surgery

## 2017-05-24 ENCOUNTER — Other Ambulatory Visit: Payer: Self-pay

## 2017-05-24 VITALS — BP 145/91 | HR 100 | Ht 69.0 in | Wt 153.0 lb

## 2017-05-24 DIAGNOSIS — R590 Localized enlarged lymph nodes: Secondary | ICD-10-CM | POA: Diagnosis not present

## 2017-05-24 NOTE — Progress Notes (Signed)
[address]       [place] [address]             [phone]                    Kristin [name] [facility] Medical Record #[id number] Date of Birth: [birth year]  Referring: Kristin Peers, MD Primary Care: Kristin Canter, MD  Chief Complaint:    Mediastinal adenopathy   History of Present Illness:    Kristin [name] 51 y.o. female is seen in the office   for 9-10 months of left face swelling.  She has a history of goiter with a thyroid biopsy done in 2013.  She has had ENT and dental evaluation for the face swelling.  She denies any history of fever chills night sweats, weight has been stable and 157 pounds.  The patient had a CT scan of the neck done at [facility] imaging report dated 04/24/2017, type report notes marked lymphadenopathy tumor mass-effect circum-the left lateral aortic arch there is lymphadenopathy in the precarinal and paratracheal areas.  PET scan was performed, and then the patient agreed to bronchoscopy with ebus.    Patient returns today after having bronchoscopy with ebus and biopsy of #7, 10 R and 10 L lymph nodes: No malignant cells were identified, no additional diagnosis was made   Family history is significant for an identical twin sister who has asthma and hypertension.  Current Activity/ Functional Status:  Patient is independent with mobility/ambulation, transfers, ADL's, IADL's.   Zubrod Score: At the time of surgery this patient's most appropriate activity status/level should be described as: [x]     0    Normal activity, no symptoms []     1    Restricted in physical strenuous activity but ambulatory, able to do out light work []     2    Ambulatory and capable of self care, unable to do work activities, up and about               >50 % of waking hours                              []     3    Only limited self care, in bed greater than 50% of waking hours []     4    Completely disabled, no self care, confined to bed or chair []     5     Moribund   Past Medical History:  Diagnosis Date  . Anxiety   . Dizziness   . Fibromyalgia 04/13/2017  . GERD (gastroesophageal reflux disease)   . Headache   . History of migraine headaches   . Hypertension    hx arrhythmia  . IBS (irritable bowel syndrome)   . Lymphadenopathy   . Seasonal allergies   . Vertigo   . Vitamin D deficiency     Past Surgical History:  Procedure Laterality Date  . DILATION AND CURETTAGE OF UTERUS  2006, 2009  . EYE SURGERY Left 1997   LASER  . THYOID NODULE  05/2009   FNA...DR. Richardson Landry .Marland KitchenMarland KitchenMarland KitchenNEG  . VAGINAL HYSTERECTOMY  12/2017   LAPAROSCOPICALLY ASSISTED/DR. ELIZABETH SKINNER  . VIDEO BRONCHOSCOPY WITH ENDOBRONCHIAL ULTRASOUND N/A 05/17/2017   Procedure: VIDEO BRONCHOSCOPY WITH ENDOBRONCHIAL ULTRASOUND WITH TRANSBRONCHIAL BX OF LYMPH NODES 7, 10 R AND 10 L.;  Surgeon: Kristin Ovens, MD;  Location: [facility] OR;  Service:  Thoracic;  Laterality: N/A;    Family History  Problem Relation Age of Onset  . Hypertension Mother   . Diabetes Mother   . Hyperthyroidism Mother   . Hypertension Father   . Heart disease Father   . Hypothyroidism Father   . Cardiomyopathy Father   . Other Father [id number]       CAUSE OF DEATH..CEREBRAL HEMORR  . Cardiomyopathy Paternal Uncle   . Heart disease Paternal Uncle        CABG  . Diabetes Maternal Grandmother   . Hypertension Maternal Grandmother   . Heart disease Maternal Grandmother        MI  . Other Maternal Grandmother        GRAVES DISEASE  . Cardiomyopathy Maternal Grandmother   . Other Maternal Grandfather        AAA  . Cardiomyopathy Maternal Grandfather   . Heart disease Maternal Grandfather        MI  . Cancer Paternal Grandmother        BREAST, OTHER UNSPECIFIED SITE  . Hypertension Paternal Grandfather   . Stroke Paternal Grandfather        ISCHEMIC STROKE  . Other Maternal Aunt        THYROID DISEASE  . Heart disease Paternal Aunt        CABG  . Cardiomyopathy Paternal Aunt   .  Cancer Paternal Aunt        STOMACH  . Other Cousin        THYROID     Social History   Socioeconomic History  . Marital status: Single    Spouse name: Not on file  .    .    .    Social Needs  .    .    .    .    .    Occupational History  t  teaches elementary school  Tobacco Use  . Smoking status: Never Smoker  . Smokeless tobacco: Never Used  Substance and Sexual Activity  . Alcohol use: No  . Drug use: No  . Sexual activity: No    Partners: Male  Other Topics Concern  . Not on file  Social History Narrative  . Not on file    Social History   Tobacco Use  Smoking Status Never Smoker  Smokeless Tobacco Never Used    Social History   Substance and Sexual Activity  Alcohol Use No     Allergies  Allergen Reactions  . Amoxicillin Hives    Has patient had a PCN reaction causing immediate rash, facial/tongue/throat swelling, SOB or lightheadedness with hypotension: Unknown Has patient had a PCN reaction causing severe rash involving mucus membranes or skin necrosis: No Has patient had a PCN reaction that required hospitalization: No Has patient had a PCN reaction occurring within the last 10 years: Yes If all of the above answers are "NO", then may proceed with Cephalosporin use.   . Azithromycin Other (See Comments)    UPSET STOMACH   . Bentyl [Dicyclomine Hcl] Diarrhea  . Doxycycline Hives  . Erythromycin Other (See Comments)    STOMACH CRAMPS  . Indocin [Indomethacin] Hives  . Lansoprazole Other (See Comments)    HEADACHE  . Levaquin [Levofloxacin] Hives  . Nexium [Esomeprazole] Hives and Other (See Comments)    HEADACHE   . Probiotic [Acidophilus] Hives  . Sudafed [Pseudoephedrine Hcl] Other (See Comments)    FAST PULSE  . Tetanus Toxoids Other (See Comments)  LOCAL REACTION...REDNESS/KNOT  . Keflex [Cephalexin] Rash  . Sulfamethoxazole Rash    Current Outpatient Medications  Medication Sig Dispense Refill  . acetaminophen  (TYLENOL) 500 MG tablet Take 500-1,000 mg every 8 (eight) hours as needed by mouth for mild pain (depends on pain if takes 12 tablets).     . Artificial Tear Solution (SOOTHE XP OP) Apply 2 drops at bedtime to eye.    . Biotin [address] MCG TABS Take 1 tablet by mouth daily.    . Cholecalciferol (VITAMIN D3) 5000 units CAPS Take 5,000 Units daily by mouth.     . doxepin (SINEQUAN) 10 MG capsule Take 40-60 mg at bedtime by mouth. Depends on IBS symptoms and migraines if takes 4-6    . frovatriptan (FROVA) 2.5 MG tablet Take 2.5 mg as needed by mouth for migraine (take 1 tablet at onset of migraine and repeat up to twice if needed). If recurs, may repeat after 2 hours. Max of 3 tabs in 24 hours.     Marland Kitchen loperamide (IMODIUM) 2 MG capsule Take 2-4 mg as needed by mouth for diarrhea or loose stools.    . Loperamide HCl (IMODIUM A-D PO) Take by mouth.    . loratadine (CLARITIN) 10 MG tablet Take 10 mg daily by mouth.     . losartan (COZAAR) 25 MG tablet Take 25 mg every evening by mouth.     . Multiple Vitamin (MULTIVITAMIN) tablet Take 1 tablet every other day by mouth.     . simethicone (MYLICON) 80 MG chewable tablet Chew 80 mg every 6 (six) hours as needed by mouth for flatulence.    . sodium chloride (OCEAN) 0.65 % SOLN nasal spray Place 1 spray as needed into both nostrils for congestion.     No current facility-administered medications for this visit.     Pertinent items are noted in HPI.   Review of Systems:     Cardiac Review of Systems: Y or N  Chest Pain [ N   ]  Resting SOB [ N] Exertional SOB  Klaus.Mock  ]  Pollyann Kennedy Klaus.Mock  ]   Pedal Edema Klaus.Mock  ]    Palpitations Klaus.Mock ] SN ]   Presyncope [ N  ]  General Review of Systems: [Y] = yes [  ]=no Constitional: recent weight change Klaus.Mock  ];  Wt loss over the last 3 months [   ] anorexia [  ]; fatigue [  ]; nausea [  ]; night sweats [ N ]; fever [ N ]; or chills N[  ];          Dental: poor dentition[N  ]; Last Dentist visit:   Eye : blurred vision [  ]; diplopia  [   ]; vision changes [  ];  Amaurosis fugax[  ]; Resp: cough Klaus.Mock  ];  wheezing[N;  hemoptysis[ N ]; shortness of breath[ N ]; paroxysmal nocturnal dyspnea[ N]; dyspnea on exertion[  ]; or orthopnea[  ];  GI:  gallstones[  ], vomiting[  ];  dysphagia[  ]; melena[  ];  hematochezia [  ]; heartburn[  ];   Hx of  Colonoscopy[  ]; GU: kidney stones [  ]; hematuria[  ];   dysuria [  ];  nocturia[  ];  history of     obstruction [  ]; urinary frequency [  ]             Skin: rash, swelling[  ];, hair loss[  ];  peripheral  edema[  ];  or itching[  ]; Musculosketetal: myalgias[  ];  joint swelling[  ];  joint erythema[  ];  joint pain[  ];  back pain[  ];  Heme/Lymph: bruising[  ];  bleeding[  ];  anemia[  ];  Neuro: TIA[  ];  headaches[  ];  stroke[  ];  vertigo[  ];  seizures[  ];   paresthesias[  ];  difficulty walking[  ];  Psych:depression[  ]; anxiety[  ];  Endocrine: diabetes[ N ];  thyroid dysfunction[Y  ];  Immunizations: Flu up to date [ Y ]; Pneumococcal up to date [ Y ];  Other:  Physical Exam: BP (!) 145/91 (BP Location: Left Arm, Patient Position: Sitting, Cuff Size: Normal)   Pulse 100   Ht 5\' 9"  (1.753 m)   Wt 153 lb (69.4 kg)   SpO2 98%   BMI 22.59 kg/m   PHYSICAL EXAMINATION: Physical exam is unchanged from last week General appearance: alert, cooperative and appears stated age Head: Normocephalic, without obvious abnormality, atraumatic Neck: no adenopathy, no carotid bruit, no JVD, supple, symmetrical, trachea midline and thyroid not enlarged, symmetric, no tenderness/mass/nodules Lymph nodes: Cervical, supraclavicular, and axillary nodes normal. and right supuclavicular fossa fills full, no definate node  Resp: clear to auscultation bilaterally Back: symmetric, no curvature. ROM normal. No CVA tenderness. Cardio: regular rate and rhythm, S1, S2 normal, no murmur, click, rub or gallop GI: soft, non-tender; bowel sounds normal; no masses,  no organomegaly Extremities:  extremities normal, atraumatic, no cyanosis or edema and Homans sign is negative, no sign of DVT Neurologic: Grossly normal  Diagnostic Studies & Laboratory data:   Paper report of ct scan , unable view disk she brought with her     Nm Pet Image Initial (pi) Skull Base To Thigh  Result Date: 05/05/2017 CLINICAL DATA:  Initial Treatment strategy for mediastinal adenopathy. EXAM: NUCLEAR MEDICINE PET SKULL BASE TO THIGH TECHNIQUE: 12.3 mCi F-18 FDG was injected intravenously. Full-ring PET imaging was performed from the skull base to thigh after the radiotracer. CT data was obtained and used for attenuation correction and anatomic localization. FASTING BLOOD GLUCOSE:  Value: 95 mg/dl COMPARISON:  Left-sided facial CT dated 04/24/2017 FINDINGS: NECK No hypermetabolic lymph nodes in the neck. The nodular right lobe of the thyroid gland is not hypermetabolic, which typically indicates that the nodule is benign. CHEST Hypermetabolic prevascular, AP window, right paratracheal, right hilar, left hilar, and subcarinal adenopathy in the chest. Index AP window lymph node measures 1.8 cm in short axis on image 87/4 and has maximum standard uptake value 13.2. Right hilar node approximately 1.3 cm in short axis on image 92/4 with maximum SUV 11.7. A peribronchovascular nodule in the left upper lobe is only about 5 by 7 mm in size on image 79/4 but has a maximum standard uptake value of 4.6 Centrilobular emphysema. 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 ABDOMEN/PELVIS No abnormal hypermetabolic activity within the liver, pancreas, adrenal glands, or spleen. No hypermetabolic lymph nodes in the abdomen or pelvis. SKELETON No focal hypermetabolic activity to suggest skeletal metastasis. IMPRESSION: 1. Mediastinal and bilateral hilar adenopathy is considerably hypermetabolic and suspicious for malignancy. There is a small (5 by 7 mm) hypermetabolic nodule in the left upper lobe in the  peribronchovascular region. This is a very small size to represent a primary, and may simply be a peribronchovascular malignant lymph node. Lymphoma is a distinct possibility and tissue diagnosis is recommended. 2. No  hypermetabolic activity associated with the nodularity in the right thyroid gland. This usually is fairly specific for benign thyroid nodule. 3. Centrilobular emphysema. 4. Aberrant right subclavian artery passes behind the esophagus. Electronically Signed   By: Gaylyn RongWalter  Liebkemann M.D.   On: 05/05/2017 14:31  I have independently reviewed the above radiology studies  and reviewed the findings with the patient.    Recent Lab Findings: Lab Results  Component Value Date   WBC 7.7 05/14/2017   HGB 14.5 05/14/2017   HCT 43.4 05/14/2017   PLT 216 05/14/2017   GLUCOSE 88 05/14/2017   ALT 19 05/14/2017   AST 22 05/14/2017   NA 137 05/14/2017   K 3.8 05/14/2017   CL 104 05/14/2017   CREATININE 0.74 05/14/2017   BUN 20 05/14/2017   CO2 25 05/14/2017   INR 1.02 05/14/2017      Assessment / Plan:   Patient comes into the office today for follow-up evaluation and review of the results of ebus and needle biopsies of number 7, 10 L and 10 R lymph nodes-the pathology did not reveal any definitive diagnosis and this was discussed with the patient and her mother.  I recommended that we proceed with left parasternal exploration/left VATS to directly biopsy the AP window nodes, which are the most prominent on PET scan.  We discussed doing this previously but the patient was reluctant to have any invasive procedure and finally agreed to ebus with biopsy.  I have given reviewed with her the need for a tissue diagnosis to be definitive, about the process with the mediastinal adenopathy documented on PET scan.  The patient is willing to proceed with left parasternal exploration/left VATS later this week.     Aberrant right subclavian artery passes behind the esophagus  Kristin OvensEdward B Maleigh Bagot  MD      [address] [place] [id number] Office [phone]   Beeper [phone]  05/25/2017 5:42 PM

## 2017-05-25 ENCOUNTER — Other Ambulatory Visit: Payer: Self-pay

## 2017-05-25 DIAGNOSIS — R590 Localized enlarged lymph nodes: Secondary | ICD-10-CM

## 2017-05-26 NOTE — Pre-Procedure Instructions (Signed)
Hanny H Esper  05/26/2017      [id number] - [place], [place] - [address] [place] [id number] Phone: [phone] Fax: [phone]    Your procedure is scheduled on Friday November 30.  [facility] Admitting at 5:30 A.M.  Call this number if you have problems the morning of surgery:  [phone]   Remember:  Do not eat food or drink liquids after midnight.  Take these medicines the morning of surgery with A SIP OF WATER:   Acetaminophen (tylenol) if needed Loratadine (claritin) Nasal spray Eye drops  7 days prior to surgery STOP taking any Aspirin(unless otherwise instructed by your surgeon), Aleve, Naproxen, Ibuprofen, Motrin, Advil, Goody's, BC's, all herbal medications, fish oil, and all vitamins    Do not wear jewelry, make-up or nail polish.  Do not wear lotions, powders, or perfumes, or deoderant.  Do not shave 48 hours prior to surgery.  Men may shave face and neck.  Do not bring valuables to the hospital.  [facility] is not responsible for any belongings or valuables.  Contacts, dentures or bridgework may not be worn into surgery.  Leave your suitcase in the car.  After surgery it may be brought to your room.  For patients admitted to the hospital, discharge time will be determined by your treatment team.  Patients discharged the day of surgery will not be allowed to drive home.  Special instructions:    [facility]- Preparing For Surgery  Before surgery, you can play an important role. Because skin is not sterile, your skin needs to be as free of germs as possible. You can reduce the number of germs on your skin by washing with CHG (chlorahexidine gluconate) Soap before surgery.  CHG is an antiseptic cleaner which kills germs and bonds with the skin to continue killing germs even after washing.  Please do not use if you have an allergy to CHG or antibacterial  soaps. If your skin becomes reddened/irritated stop using the CHG.  Do not shave (including legs and underarms) for at least 48 hours prior to first CHG shower. It is OK to shave your face.  Please follow these instructions carefully.   1. Shower the NIGHT BEFORE SURGERY and the MORNING OF SURGERY with CHG.   2. If you chose to wash your hair, wash your hair first as usual with your normal shampoo.  3. After you shampoo, rinse your hair and body thoroughly to remove the shampoo.  4. Use CHG as you would any other liquid soap. You can apply CHG directly to the skin and wash gently with a scrungie or a clean washcloth.   5. Apply the CHG Soap to your body ONLY FROM THE NECK DOWN.  Do not use on open wounds or open sores. Avoid contact with your eyes, ears, mouth and genitals (private parts). Wash Face and genitals (private parts)  with your normal soap.  6. Wash thoroughly, paying special attention to the area where your surgery will be performed.  7. Thoroughly rinse your body with warm water from the neck down.  8. DO NOT shower/wash with your normal soap after using and rinsing off the CHG Soap.  9. Pat yourself dry with a CLEAN TOWEL.  10. Wear CLEAN PAJAMAS to bed the night before surgery, wear comfortable clothes the morning of surgery  11. Place CLEAN SHEETS on your bed the night of your first shower  and DO NOT SLEEP WITH PETS.    Day of Surgery: Do not apply any deodorants/lotions. Please wear clean clothes to the hospital/surgery center.      Please read over the following fact sheets that you were given. Coughing and Deep Breathing and Surgical Site Infection Prevention

## 2017-05-27 ENCOUNTER — Encounter (HOSPITAL_COMMUNITY): Payer: Self-pay

## 2017-05-27 ENCOUNTER — Other Ambulatory Visit: Payer: Self-pay

## 2017-05-27 ENCOUNTER — Ambulatory Visit (HOSPITAL_COMMUNITY)
Admission: RE | Admit: 2017-05-27 | Discharge: 2017-05-27 | Disposition: A | Discharge status: Home / Self Care | Payer: 59 | Source: Ambulatory Visit | Attending: Cardiothoracic Surgery | Admitting: Cardiothoracic Surgery

## 2017-05-27 ENCOUNTER — Encounter (HOSPITAL_COMMUNITY)
Admission: RE | Admit: 2017-05-27 | Discharge: 2017-05-27 | Disposition: A | Discharge status: Home / Self Care | Payer: 59 | Source: Ambulatory Visit | Attending: Cardiothoracic Surgery | Admitting: Cardiothoracic Surgery

## 2017-05-27 DIAGNOSIS — Z01818 Encounter for other preprocedural examination: Secondary | ICD-10-CM

## 2017-05-27 DIAGNOSIS — J9811 Atelectasis: Secondary | ICD-10-CM

## 2017-05-27 DIAGNOSIS — J449 Chronic obstructive pulmonary disease, unspecified: Secondary | ICD-10-CM | POA: Insufficient documentation

## 2017-05-27 DIAGNOSIS — R590 Localized enlarged lymph nodes: Secondary | ICD-10-CM

## 2017-05-27 DIAGNOSIS — R911 Solitary pulmonary nodule: Secondary | ICD-10-CM

## 2017-05-27 DIAGNOSIS — J984 Other disorders of lung: Secondary | ICD-10-CM | POA: Insufficient documentation

## 2017-05-27 HISTORY — DX: Nontoxic single thyroid nodule: E04.1

## 2017-05-27 HISTORY — DX: Cardiac arrhythmia, unspecified: I49.9

## 2017-05-27 LAB — PULMONARY FUNCTION TEST
DL/VA % pred: 82 %
DL/VA: 4.4 ml/min/mmHg/L
DLCO unc % pred: 68 %
DLCO unc: 21.35 ml/min/mmHg
FEF 25-75 Pre: 1.69 L/sec
FEF2575-%Pred-Pre: 55 %
FEV1-%Pred-Pre: 72 %
FEV1-Pre: 2.38 L
FEV1FVC-%Pred-Pre: 90 %
FEV6-%Pred-Pre: 79 %
FEV6-Pre: 3.24 L
FEV6FVC-%Pred-Pre: 101 %
FVC-%Pred-Pre: 79 %
FVC-Pre: 3.3 L
Pre FEV1/FVC ratio: 72 %
Pre FEV6/FVC Ratio: 98 %
RV % pred: 122 %
RV: 2.52 L
TLC % pred: 99 %
TLC: 5.75 L

## 2017-05-27 LAB — COMPREHENSIVE METABOLIC PANEL
ALT: 25 U/L (ref 14–54)
AST: 22 U/L (ref 15–41)
Albumin: 3.9 g/dL (ref 3.5–5.0)
Alkaline Phosphatase: 104 U/L (ref 38–126)
Anion gap: 9 (ref 5–15)
BUN: 16 mg/dL (ref 6–20)
CO2: 22 mmol/L (ref 22–32)
Calcium: 9.2 mg/dL (ref 8.9–10.3)
Chloride: 106 mmol/L (ref 101–111)
Creatinine, Ser: 0.72 mg/dL (ref 0.44–1.00)
GFR calc Af Amer: >60 mL/min (ref >60)
GFR calc non Af Amer: >60 mL/min (ref >60)
Glucose, Bld: 115 mg/dL — ABNORMAL HIGH (ref 65–99)
Potassium: 3.9 mmol/L (ref 3.5–5.1)
Sodium: 137 mmol/L (ref 135–145)
Total Bilirubin: 0.6 mg/dL (ref 0.3–1.2)
Total Protein: 7 g/dL (ref 6.5–8.1)

## 2017-05-27 LAB — URINALYSIS, ROUTINE W REFLEX MICROSCOPIC
Bilirubin Urine: NEGATIVE
Glucose, UA: NEGATIVE mg/dL
Hgb urine dipstick: NEGATIVE
Ketones, ur: NEGATIVE mg/dL
Leukocytes, UA: NEGATIVE
Nitrite: NEGATIVE
Protein, ur: NEGATIVE mg/dL
Specific Gravity, Urine: 1.019 (ref 1.005–1.030)
pH: 7 (ref 5.0–8.0)

## 2017-05-27 LAB — BLOOD GAS, ARTERIAL
Acid-Base Excess: 0.3 mmol/L (ref 0.0–2.0)
Bicarbonate: 23.8 mmol/L (ref 20.0–28.0)
Drawn by: 4498211
FIO2: 0.21
O2 Saturation: 97.9 %
Patient temperature: 98.6
pCO2 arterial: 34.2 mmHg (ref 32.0–48.0)
pH, Arterial: 7.456 — ABNORMAL HIGH (ref 7.350–7.450)
pO2, Arterial: 101 mmHg (ref 83.0–108.0)

## 2017-05-27 LAB — CBC
HCT: 41.8 % (ref 36.0–46.0)
Hemoglobin: 13.9 g/dL (ref 12.0–15.0)
MCH: 28.1 pg (ref 26.0–34.0)
MCHC: 33.3 g/dL (ref 30.0–36.0)
MCV: 84.6 fL (ref 78.0–100.0)
Platelets: 183 10*3/uL (ref 150–400)
RBC: 4.94 MIL/uL (ref 3.87–5.11)
RDW: 12.9 % (ref 11.5–15.5)
WBC: 7 10*3/uL (ref 4.0–10.5)

## 2017-05-27 LAB — TYPE AND SCREEN
ABO/RH(D): A POS
ABO/RH(D): A POS
Antibody Screen: NEGATIVE
Antibody Screen: NEGATIVE

## 2017-05-27 LAB — APTT: aPTT: 32 seconds (ref 24–36)

## 2017-05-27 LAB — SURGICAL PCR SCREEN
MRSA, PCR: NEGATIVE
Staphylococcus aureus: NEGATIVE

## 2017-05-27 LAB — PROTIME-INR
INR: 1.04
Prothrombin Time: 13.5 seconds (ref 11.4–15.2)

## 2017-05-27 MED ORDER — VANCOMYCIN HCL IN DEXTROSE 1-5 GM/200ML-% IV SOLN
1000.0000 mg | INTRAVENOUS | Status: AC
  Administered 2017-05-28: 1000 mg via INTRAVENOUS
  Filled 2017-05-27: qty 200

## 2017-05-27 NOTE — Progress Notes (Signed)
Patient c/o feeling little pain to mid chest/ epigastric area, HR 100.  Heart rated was 102 last week. Patient stated her PCP is aware.  Patient stated this is not new in fact not quite as bad as when she talked with PA after surgery 1 week ago.  encouraged patient to take her aciphex.  encouraged patient  To call primary physician if her HR continues to stay high and acid reflux get worse.

## 2017-05-28 ENCOUNTER — Inpatient Hospital Stay (HOSPITAL_COMMUNITY): Payer: 59

## 2017-05-28 ENCOUNTER — Encounter (HOSPITAL_COMMUNITY)
Admission: RE | Disposition: A | Discharge status: Home / Self Care | Payer: Self-pay | Source: Ambulatory Visit | Attending: Cardiothoracic Surgery

## 2017-05-28 ENCOUNTER — Inpatient Hospital Stay (HOSPITAL_COMMUNITY): Payer: 59 | Admitting: Anesthesiology

## 2017-05-28 ENCOUNTER — Encounter (HOSPITAL_COMMUNITY): Payer: Self-pay | Admitting: *Deleted

## 2017-05-28 ENCOUNTER — Inpatient Hospital Stay (HOSPITAL_COMMUNITY)
Admission: RE | Admit: 2017-05-28 | Discharge: 2017-05-31 | DRG: 167 | Disposition: A | Discharge status: Home / Self Care | Payer: 59 | Source: Ambulatory Visit | Attending: Cardiothoracic Surgery | Admitting: Cardiothoracic Surgery

## 2017-05-28 DIAGNOSIS — K219 Gastro-esophageal reflux disease without esophagitis: Secondary | ICD-10-CM | POA: Diagnosis present

## 2017-05-28 DIAGNOSIS — M797 Fibromyalgia: Secondary | ICD-10-CM | POA: Diagnosis present

## 2017-05-28 DIAGNOSIS — E559 Vitamin D deficiency, unspecified: Secondary | ICD-10-CM | POA: Diagnosis present

## 2017-05-28 DIAGNOSIS — R590 Localized enlarged lymph nodes: Secondary | ICD-10-CM

## 2017-05-28 DIAGNOSIS — D869 Sarcoidosis, unspecified: Secondary | ICD-10-CM | POA: Diagnosis present

## 2017-05-28 DIAGNOSIS — D649 Anemia, unspecified: Secondary | ICD-10-CM | POA: Diagnosis present

## 2017-05-28 DIAGNOSIS — I10 Essential (primary) hypertension: Secondary | ICD-10-CM | POA: Diagnosis present

## 2017-05-28 DIAGNOSIS — Z79899 Other long term (current) drug therapy: Secondary | ICD-10-CM

## 2017-05-28 DIAGNOSIS — D861 Sarcoidosis of lymph nodes: Principal | ICD-10-CM | POA: Diagnosis present

## 2017-05-28 DIAGNOSIS — F419 Anxiety disorder, unspecified: Secondary | ICD-10-CM | POA: Diagnosis present

## 2017-05-28 DIAGNOSIS — J302 Other seasonal allergic rhinitis: Secondary | ICD-10-CM | POA: Diagnosis present

## 2017-05-28 DIAGNOSIS — Z09 Encounter for follow-up examination after completed treatment for conditions other than malignant neoplasm: Secondary | ICD-10-CM

## 2017-05-28 DIAGNOSIS — E049 Nontoxic goiter, unspecified: Secondary | ICD-10-CM | POA: Diagnosis present

## 2017-05-28 DIAGNOSIS — J9811 Atelectasis: Secondary | ICD-10-CM | POA: Diagnosis present

## 2017-05-28 DIAGNOSIS — D860 Sarcoidosis of lung: Secondary | ICD-10-CM | POA: Diagnosis present

## 2017-05-28 DIAGNOSIS — J9383 Other pneumothorax: Secondary | ICD-10-CM | POA: Diagnosis present

## 2017-05-28 HISTORY — PX: VIDEO ASSISTED THORACOSCOPY (VATS)/ LYMPH NODE SAMPLING: SHX6170

## 2017-05-28 SURGERY — VIDEO ASSISTED THORACOSCOPY (VATS)/ LYMPH NODE SAMPLING
Anesthesia: General | Site: Chest | Laterality: Left

## 2017-05-28 MED ORDER — ONDANSETRON HCL 4 MG/2ML IJ SOLN
INTRAMUSCULAR | Status: AC
  Filled 2017-05-28: qty 4

## 2017-05-28 MED ORDER — FENTANYL CITRATE (PF) 100 MCG/2ML IJ SOLN
INTRAMUSCULAR | Status: DC | PRN
  Administered 2017-05-28: 100 ug via INTRAVENOUS
  Administered 2017-05-28: 50 ug via INTRAVENOUS
  Administered 2017-05-28: 150 ug via INTRAVENOUS

## 2017-05-28 MED ORDER — MIDAZOLAM HCL 2 MG/2ML IJ SOLN: 0.5000 mg | Freq: Once | INTRAMUSCULAR | Status: DC

## 2017-05-28 MED ORDER — FENTANYL CITRATE (PF) 100 MCG/2ML IJ SOLN
INTRAMUSCULAR | Status: AC
Start: 2017-05-28 — End: 2017-05-28
  Administered 2017-05-28: 50 ug via INTRAVENOUS
  Filled 2017-05-28: qty 2

## 2017-05-28 MED ORDER — FENTANYL CITRATE (PF) 250 MCG/5ML IJ SOLN
INTRAMUSCULAR | Status: AC
  Filled 2017-05-28: qty 5

## 2017-05-28 MED ORDER — POTASSIUM CHLORIDE 10 MEQ/50ML IV SOLN
10.0000 meq | Freq: Every day | INTRAVENOUS | Status: DC | PRN
  Filled 2017-05-28: qty 50

## 2017-05-28 MED ORDER — MIDAZOLAM HCL 2 MG/2ML IJ SOLN
INTRAMUSCULAR | Status: AC
  Administered 2017-05-28: 2 mg via INTRAVENOUS
  Filled 2017-05-28: qty 4

## 2017-05-28 MED ORDER — PROMETHAZINE HCL 25 MG/ML IJ SOLN: 6.2500 mg | INTRAMUSCULAR | Status: DC | PRN

## 2017-05-28 MED ORDER — ONDANSETRON HCL 4 MG/2ML IJ SOLN
INTRAMUSCULAR | Status: DC | PRN
  Administered 2017-05-28: 4 mg via INTRAVENOUS

## 2017-05-28 MED ORDER — SODIUM CHLORIDE 0.9% FLUSH
9.0000 mL | INTRAVENOUS | Status: DC | PRN
Start: 2017-05-28 — End: 2017-05-29

## 2017-05-28 MED ORDER — MIDAZOLAM HCL 2 MG/2ML IJ SOLN
0.5000 mg | Freq: Once | INTRAMUSCULAR | Status: AC
  Administered 2017-05-28: 0.5 mg via INTRAVENOUS

## 2017-05-28 MED ORDER — TRAMADOL HCL 50 MG PO TABS
50.0000 mg | ORAL_TABLET | Freq: Four times a day (QID) | ORAL | Status: DC | PRN
  Administered 2017-05-29 – 2017-05-31 (×6): 100 mg via ORAL
  Filled 2017-05-28 (×6): qty 2

## 2017-05-28 MED ORDER — LACTATED RINGERS IV SOLN
INTRAVENOUS | Status: DC | PRN
Start: 2017-05-28 — End: 2017-05-28
  Administered 2017-05-28: 08:00:00 via INTRAVENOUS

## 2017-05-28 MED ORDER — HYDROMORPHONE HCL 1 MG/ML IJ SOLN
INTRAMUSCULAR | Status: AC
  Filled 2017-05-28: qty 1

## 2017-05-28 MED ORDER — SENNOSIDES-DOCUSATE SODIUM 8.6-50 MG PO TABS
1.0000 | ORAL_TABLET | Freq: Every day | ORAL | Status: DC
  Filled 2017-05-28: qty 1

## 2017-05-28 MED ORDER — LACTATED RINGERS IV SOLN
INTRAVENOUS | Status: DC | PRN
  Administered 2017-05-28: 10:00:00 via INTRAVENOUS

## 2017-05-28 MED ORDER — DIPHENHYDRAMINE HCL 12.5 MG/5ML PO ELIX
12.5000 mg | ORAL_SOLUTION | Freq: Four times a day (QID) | ORAL | Status: DC | PRN
  Filled 2017-05-28: qty 5

## 2017-05-28 MED ORDER — KCL IN DEXTROSE-NACL 20-5-0.45 MEQ/L-%-% IV SOLN
INTRAVENOUS | Status: DC
  Administered 2017-05-28 – 2017-05-29 (×2): via INTRAVENOUS
  Filled 2017-05-28 (×3): qty 1000

## 2017-05-28 MED ORDER — DEXAMETHASONE SODIUM PHOSPHATE 10 MG/ML IJ SOLN
INTRAMUSCULAR | Status: DC | PRN
  Administered 2017-05-28: 10 mg via INTRAVENOUS

## 2017-05-28 MED ORDER — ACETAMINOPHEN 160 MG/5ML PO SOLN: 1000.0000 mg | Freq: Four times a day (QID) | ORAL | Status: DC

## 2017-05-28 MED ORDER — ENOXAPARIN SODIUM 40 MG/0.4ML ~~LOC~~ SOLN
40.0000 mg | Freq: Every day | SUBCUTANEOUS | Status: DC
  Administered 2017-05-29 – 2017-05-30 (×2): 40 mg via SUBCUTANEOUS
  Filled 2017-05-28 (×3): qty 0.4

## 2017-05-28 MED ORDER — LIDOCAINE 2% (20 MG/ML) 5 ML SYRINGE
INTRAMUSCULAR | Status: DC | PRN
  Administered 2017-05-28: 80 mg via INTRAVENOUS

## 2017-05-28 MED ORDER — ROCURONIUM BROMIDE 10 MG/ML (PF) SYRINGE
PREFILLED_SYRINGE | INTRAVENOUS | Status: DC | PRN
  Administered 2017-05-28: 50 mg via INTRAVENOUS
  Administered 2017-05-28: 20 mg via INTRAVENOUS

## 2017-05-28 MED ORDER — PROPOFOL 10 MG/ML IV BOLUS
INTRAVENOUS | Status: DC | PRN
  Administered 2017-05-28: 50 mg via INTRAVENOUS

## 2017-05-28 MED ORDER — HYDROMORPHONE HCL 1 MG/ML IJ SOLN
0.2500 mg | INTRAMUSCULAR | Status: DC | PRN
  Administered 2017-05-28 (×2): 0.5 mg via INTRAVENOUS

## 2017-05-28 MED ORDER — DEXAMETHASONE SODIUM PHOSPHATE 10 MG/ML IJ SOLN
INTRAMUSCULAR | Status: AC
  Filled 2017-05-28: qty 2

## 2017-05-28 MED ORDER — PHENYLEPHRINE HCL 10 MG/ML IJ SOLN
INTRAVENOUS | Status: DC | PRN
  Administered 2017-05-28: 20 ug/min via INTRAVENOUS

## 2017-05-28 MED ORDER — FENTANYL CITRATE (PF) 100 MCG/2ML IJ SOLN
50.0000 ug | Freq: Once | INTRAMUSCULAR | Status: AC
  Administered 2017-05-28: 50 ug via INTRAVENOUS

## 2017-05-28 MED ORDER — BISACODYL 5 MG PO TBEC
10.0000 mg | DELAYED_RELEASE_TABLET | Freq: Every day | ORAL | Status: DC
  Administered 2017-05-29: 10 mg via ORAL
  Filled 2017-05-28 (×4): qty 2

## 2017-05-28 MED ORDER — NALOXONE HCL 0.4 MG/ML IJ SOLN
0.4000 mg | INTRAMUSCULAR | Status: DC | PRN
Start: 2017-05-28 — End: 2017-05-29
  Filled 2017-05-28: qty 1

## 2017-05-28 MED ORDER — MIDAZOLAM HCL 2 MG/2ML IJ SOLN
2.0000 mg | Freq: Once | INTRAMUSCULAR | Status: AC
  Administered 2017-05-28: 2 mg via INTRAVENOUS

## 2017-05-28 MED ORDER — SUGAMMADEX SODIUM 200 MG/2ML IV SOLN
INTRAVENOUS | Status: DC | PRN
  Administered 2017-05-28: 150 mg via INTRAVENOUS

## 2017-05-28 MED ORDER — OXYCODONE HCL 5 MG PO TABS
5.0000 mg | ORAL_TABLET | ORAL | Status: DC | PRN
  Administered 2017-05-28 – 2017-05-29 (×3): 10 mg via ORAL
  Filled 2017-05-28 (×3): qty 2

## 2017-05-28 MED ORDER — 0.9 % SODIUM CHLORIDE (POUR BTL) OPTIME
TOPICAL | Status: DC | PRN
  Administered 2017-05-28: 3000 mL

## 2017-05-28 MED ORDER — DIPHENHYDRAMINE HCL 50 MG/ML IJ SOLN
12.5000 mg | Freq: Four times a day (QID) | INTRAMUSCULAR | Status: DC | PRN
  Filled 2017-05-28: qty 0.25

## 2017-05-28 MED ORDER — ACETAMINOPHEN 500 MG PO TABS
1000.0000 mg | ORAL_TABLET | Freq: Four times a day (QID) | ORAL | Status: DC
  Administered 2017-05-28 – 2017-05-31 (×9): 1000 mg via ORAL
  Filled 2017-05-28 (×10): qty 2

## 2017-05-28 MED ORDER — PHENYLEPHRINE 40 MCG/ML (10ML) SYRINGE FOR IV PUSH (FOR BLOOD PRESSURE SUPPORT)
PREFILLED_SYRINGE | INTRAVENOUS | Status: DC | PRN
  Administered 2017-05-28: 80 ug via INTRAVENOUS
  Administered 2017-05-28: 40 ug via INTRAVENOUS
  Administered 2017-05-28: 120 ug via INTRAVENOUS

## 2017-05-28 MED ORDER — FENTANYL 40 MCG/ML IV SOLN
INTRAVENOUS | Status: DC
  Administered 2017-05-28: 140 ug via INTRAVENOUS
  Administered 2017-05-28: 10 ug via INTRAVENOUS
  Administered 2017-05-28: 100 ug via INTRAVENOUS
  Administered 2017-05-28: 160 ug via INTRAVENOUS
  Filled 2017-05-28: qty 25

## 2017-05-28 MED ORDER — MIDAZOLAM HCL 2 MG/2ML IJ SOLN
INTRAMUSCULAR | Status: AC
  Filled 2017-05-28: qty 2

## 2017-05-28 MED ORDER — ONDANSETRON HCL 4 MG/2ML IJ SOLN: 4.0000 mg | Freq: Four times a day (QID) | INTRAMUSCULAR | Status: DC | PRN

## 2017-05-28 MED ORDER — ONDANSETRON HCL 4 MG/2ML IJ SOLN
4.0000 mg | Freq: Four times a day (QID) | INTRAMUSCULAR | Status: DC | PRN
  Filled 2017-05-28: qty 2

## 2017-05-28 MED ORDER — HEMOSTATIC AGENTS (NO CHARGE) OPTIME
TOPICAL | Status: DC | PRN
  Administered 2017-05-28: 1 via TOPICAL

## 2017-05-28 MED ORDER — PHENYLEPHRINE 40 MCG/ML (10ML) SYRINGE FOR IV PUSH (FOR BLOOD PRESSURE SUPPORT)
PREFILLED_SYRINGE | INTRAVENOUS | Status: AC
  Filled 2017-05-28: qty 20

## 2017-05-28 SURGICAL SUPPLY — 92 items
APPLICATOR TIP COSEAL (VASCULAR PRODUCTS) IMPLANT
APPLICATOR TIP EXT COSEAL (VASCULAR PRODUCTS) IMPLANT
BLADE SURG 11 STRL SS (BLADE) IMPLANT
CANISTER SUCT 3000ML PPV (MISCELLANEOUS) ×2 IMPLANT
CATH KIT ON Q 5IN SLV (PAIN MANAGEMENT) IMPLANT
CATH THORACIC 28FR (CATHETERS) IMPLANT
CATH THORACIC 36FR (CATHETERS) IMPLANT
CATH THORACIC 36FR RT ANG (CATHETERS) IMPLANT
CLIP VESOCCLUDE MED 6/CT (CLIP) ×2 IMPLANT
CLIP VESOCCLUDE SM WIDE 6/CT (CLIP) ×2 IMPLANT
CONN ST 1/4X3/8  BEN (MISCELLANEOUS) ×1
CONN ST 1/4X3/8 BEN (MISCELLANEOUS) ×1 IMPLANT
CONN Y 3/8X3/8X3/8  BEN (MISCELLANEOUS)
CONN Y 3/8X3/8X3/8 BEN (MISCELLANEOUS) IMPLANT
CONT SPEC 4OZ CLIKSEAL STRL BL (MISCELLANEOUS) ×8 IMPLANT
COVER SURGICAL LIGHT HANDLE (MISCELLANEOUS) IMPLANT
DERMABOND ADVANCED (GAUZE/BANDAGES/DRESSINGS) ×1
DERMABOND ADVANCED .7 DNX12 (GAUZE/BANDAGES/DRESSINGS) ×1 IMPLANT
DRAIN CHANNEL 28F RND 3/8 FF (WOUND CARE) ×2 IMPLANT
DRAPE CHEST BREAST 15X10 FENES (DRAPES) IMPLANT
DRAPE LAPAROSCOPIC ABDOMINAL (DRAPES) ×2 IMPLANT
DRAPE SLUSH/WARMER DISC (DRAPES) ×2 IMPLANT
DRAPE WARM FLUID 44X44 (DRAPE) IMPLANT
DRILL BIT 7/64X5 (BIT) IMPLANT
DRSG AQUACEL AG ADV 3.5X14 (GAUZE/BANDAGES/DRESSINGS) IMPLANT
ELECT BLADE 4.0 EZ CLEAN MEGAD (MISCELLANEOUS) ×2
ELECT REM PT RETURN 9FT ADLT (ELECTROSURGICAL) ×2
ELECTRODE BLDE 4.0 EZ CLN MEGD (MISCELLANEOUS) ×1 IMPLANT
ELECTRODE REM PT RTRN 9FT ADLT (ELECTROSURGICAL) ×1 IMPLANT
GAUZE SPONGE 4X4 12PLY STRL (GAUZE/BANDAGES/DRESSINGS) ×2 IMPLANT
GLOVE BIO SURGEON STRL SZ 6.5 (GLOVE) ×4 IMPLANT
GLOVE BIOGEL PI IND STRL 6 (GLOVE) ×1 IMPLANT
GLOVE BIOGEL PI IND STRL 6.5 (GLOVE) ×2 IMPLANT
GLOVE BIOGEL PI INDICATOR 6 (GLOVE) ×1
GLOVE BIOGEL PI INDICATOR 6.5 (GLOVE) ×2
GOWN STRL REUS W/ TWL LRG LVL3 (GOWN DISPOSABLE) ×4 IMPLANT
GOWN STRL REUS W/TWL LRG LVL3 (GOWN DISPOSABLE) ×4
HEMOSTAT SURGICEL 2X14 (HEMOSTASIS) ×2 IMPLANT
KIT BASIN OR (CUSTOM PROCEDURE TRAY) ×2 IMPLANT
KIT ROOM TURNOVER OR (KITS) ×2 IMPLANT
KIT SUCTION CATH 14FR (SUCTIONS) ×2 IMPLANT
NS IRRIG 1000ML POUR BTL (IV SOLUTION) ×6 IMPLANT
PACK CHEST (CUSTOM PROCEDURE TRAY) ×2 IMPLANT
PACK GENERAL/GYN (CUSTOM PROCEDURE TRAY) IMPLANT
PAD ARMBOARD 7.5X6 YLW CONV (MISCELLANEOUS) ×4 IMPLANT
PASSER SUT SWANSON 36MM LOOP (INSTRUMENTS) IMPLANT
SCISSORS LAP 5X35 DISP (ENDOMECHANICALS) IMPLANT
SEALANT PROGEL (MISCELLANEOUS) IMPLANT
SEALANT SURG COSEAL 4ML (VASCULAR PRODUCTS) IMPLANT
SEALANT SURG COSEAL 8ML (VASCULAR PRODUCTS) IMPLANT
SOLUTION ANTI FOG 6CC (MISCELLANEOUS) ×2 IMPLANT
SPONGE INTESTINAL PEANUT (DISPOSABLE) ×2 IMPLANT
STAPLER VISISTAT 35W (STAPLE) IMPLANT
SUT PROLENE 3 0 SH DA (SUTURE) IMPLANT
SUT PROLENE 4 0 RB 1 (SUTURE)
SUT PROLENE 4-0 RB1 .5 CRCL 36 (SUTURE) IMPLANT
SUT SILK  1 MH (SUTURE) ×2
SUT SILK 1 MH (SUTURE) ×2 IMPLANT
SUT SILK 1 TIES 10X30 (SUTURE) IMPLANT
SUT SILK 2 0SH CR/8 30 (SUTURE) IMPLANT
SUT SILK 3 0SH CR/8 30 (SUTURE) IMPLANT
SUT VIC AB 0 CT1 18XCR BRD 8 (SUTURE) ×1 IMPLANT
SUT VIC AB 0 CT1 8-18 (SUTURE) ×1
SUT VIC AB 1 CTX 18 (SUTURE) IMPLANT
SUT VIC AB 1 CTX 36 (SUTURE)
SUT VIC AB 1 CTX36XBRD ANBCTR (SUTURE) IMPLANT
SUT VIC AB 2-0 CT1 27 (SUTURE) ×1
SUT VIC AB 2-0 CT1 TAPERPNT 27 (SUTURE) ×1 IMPLANT
SUT VIC AB 2-0 CTX 36 (SUTURE) IMPLANT
SUT VIC AB 2-0 UR6 27 (SUTURE) IMPLANT
SUT VIC AB 3-0 SH 18 (SUTURE) IMPLANT
SUT VIC AB 3-0 SH 8-18 (SUTURE) IMPLANT
SUT VIC AB 3-0 X1 27 (SUTURE) ×2 IMPLANT
SUT VICRYL 0 UR6 27IN ABS (SUTURE) IMPLANT
SUT VICRYL 2 TP 1 (SUTURE) IMPLANT
SUT VICRYL 4-0 PS2 18IN ABS (SUTURE) IMPLANT
SWAB COLLECTION DEVICE MRSA (MISCELLANEOUS) IMPLANT
SWAB CULTURE ESWAB REG 1ML (MISCELLANEOUS) IMPLANT
SYR 10ML LL (SYRINGE) IMPLANT
SYSTEM SAHARA CHEST DRAIN ATS (WOUND CARE) IMPLANT
TAPE CLOTH 4X10 WHT NS (GAUZE/BANDAGES/DRESSINGS) IMPLANT
TAPE UMBILICAL COTTON 1/8X30 (MISCELLANEOUS) IMPLANT
TIP APPLICATOR SPRAY EXTEND 16 (VASCULAR PRODUCTS) IMPLANT
TOWEL GREEN STERILE (TOWEL DISPOSABLE) ×4 IMPLANT
TOWEL GREEN STERILE FF (TOWEL DISPOSABLE) ×4 IMPLANT
TOWEL OR 17X24 6PK STRL BLUE (TOWEL DISPOSABLE) IMPLANT
TOWEL OR 17X26 10 PK STRL BLUE (TOWEL DISPOSABLE) IMPLANT
TRAP SPECIMEN MUCOUS 40CC (MISCELLANEOUS) IMPLANT
TRAY FOLEY CATH SILVER 16FR LF (SET/KITS/TRAYS/PACK) ×2 IMPLANT
TROCAR BLADELESS 12MM (ENDOMECHANICALS) IMPLANT
TUNNELER SHEATH ON-Q 11GX8 DSP (PAIN MANAGEMENT) IMPLANT
WATER STERILE IRR 1000ML POUR (IV SOLUTION) ×4 IMPLANT

## 2017-05-28 NOTE — H&P (Signed)
[address]       [place] [id number]             [phone]                    Jonna Munromie H Kotara [facility] Medical Record #[id number] Date of Birth: 1965-10-08  Referring: No ref. provider found Primary Care: Olivia CanterKelly, [name], MD  Chief Complaint:    Mediastinal adenopathy   History of Present Illness:    Kristin [name] 51 y.o. [name] is seen in the office   for 9-10 months of left face swelling.  She has a history of goiter with a thyroid biopsy done in 2013.  She has had ENT and dental evaluation for the face swelling.  She denies any history of fever chills night sweats, weight has been stable and 157 pounds.  The patient had a CT [facility] of the [facility] report dated 04/24/2017, type report notes marked lymphadenopathy tumor mass-effect circum-the left lateral aortic arch there is lymphadenopathy in the precarinal and paratracheal areas.  PET scan was performed, and then the patient agreed to bronchoscopy with ebus.    Patient returns today after having bronchoscopy with ebus and biopsy of #7, 10 R and 10 L lymph nodes: No malignant cells were identified, no additional diagnosis was made   Family history is significant for an identical twin sister who has asthma and hypertension.  Current Activity/ Functional Status:  Patient is independent with mobility/ambulation, transfers, ADL's, IADL's.   Zubrod Score: At the time of surgery this patient's most appropriate activity status/level should be described as: [x]     0    Normal activity, no symptoms []     1    Restricted in physical strenuous activity but ambulatory, able to do out light work []     2    Ambulatory and capable of self care, unable to do work activities, up and about               >50 % of waking hours                              []     3    Only limited self care, in bed greater than 50% of waking hours []     4    Completely disabled, no self care, confined to bed or chair []      5    Moribund   Past Medical History:  Diagnosis Date  . Anxiety   . Dizziness   . Dysrhythmia    palpitations  . Fibromyalgia 04/13/2017  . GERD (gastroesophageal reflux disease)   . Headache   . History of migraine headaches   . Hypertension    hx arrhythmia  . IBS (irritable bowel syndrome)   . Lymphadenopathy   . Seasonal allergies   . Thyroid nodule   . Vertigo   . Vitamin D deficiency     Past Surgical History:  Procedure Laterality Date  . DILATION AND CURETTAGE OF UTERUS  2006, 2009  . EYE SURGERY Bilateral 1997   LASER  . THYOID NODULE  05/2009   FNA...DR. Richardson LandrySOLLENBERGER .Marland Kitchen.Marland Kitchen.Marland Kitchen.NEG  . VAGINAL HYSTERECTOMY  12/2017   LAPAROSCOPICALLY ASSISTED/DR. ELIZABETH SKINNER  . VIDEO BRONCHOSCOPY WITH ENDOBRONCHIAL ULTRASOUND N/A 05/17/2017   Procedure: VIDEO BRONCHOSCOPY WITH ENDOBRONCHIAL ULTRASOUND WITH TRANSBRONCHIAL BX OF LYMPH NODES 7, 10 R AND 10  L.;  Surgeon: Delight Ovens, MD;  Location: [facility] OR;  Service: Thoracic;  Laterality: N/A;    Family History  Problem Relation Age of Onset  . Hypertension Mother   . Diabetes Mother   . Hyperthyroidism Mother   . Hypertension Father   . Heart disease Father   . Hypothyroidism Father   . Cardiomyopathy Father   . Other Father 30       CAUSE OF DEATH..CEREBRAL HEMORR  . Cardiomyopathy Paternal Uncle   . Heart disease Paternal Uncle        CABG  . Diabetes Maternal Grandmother   . Hypertension Maternal Grandmother   . Heart disease Maternal Grandmother        MI  . Other Maternal Grandmother        GRAVES DISEASE  . Cardiomyopathy Maternal Grandmother   . Other Maternal Grandfather        AAA  . Cardiomyopathy Maternal Grandfather   . Heart disease Maternal Grandfather        MI  . Cancer Paternal Grandmother        BREAST, OTHER UNSPECIFIED SITE  . Hypertension Paternal Grandfather   . Stroke Paternal Grandfather        ISCHEMIC STROKE  . Other Maternal Aunt        THYROID DISEASE  . Heart disease  Paternal Aunt        CABG  . Cardiomyopathy Paternal Aunt   . Cancer Paternal Aunt        STOMACH  . Other Cousin        THYROID     Social History   Socioeconomic History  . Marital status: Single    Spouse name: Not on file  .    .    .    Social Needs  .    .    .    .    .    Occupational History  t  teaches elementary school  Tobacco Use  . Smoking status: Never Smoker  . Smokeless tobacco: Never Used  Substance and Sexual Activity  . Alcohol use: No  . Drug use: No  . Sexual activity: No    Partners: Male  Other Topics Concern  . Not on file  Social History Narrative  . Not on file    Social History   Tobacco Use  Smoking Status Never Smoker  Smokeless Tobacco Never Used    Social History   Substance and Sexual Activity  Alcohol Use No     Allergies  Allergen Reactions  . Amoxicillin Hives    Has patient had a PCN reaction causing immediate rash, facial/tongue/throat swelling, SOB or lightheadedness with hypotension: UNKNOWN  Has patient had a PCN reaction causing severe rash involving mucus membranes or skin necrosis:  #  #  #  NO  #  #  #  Has patient had a PCN reaction that required hospitalization  #  #  #  NO  #  #  #  Has patient had a PCN reaction occurring within the last 10 years: #  #  #  YES  #  #  #  If all "NO's", may proceed with Cephalosporin use.  Marland Kitchen Doxycycline Hives  . Indocin [Indomethacin] Hives  . Levaquin [Levofloxacin] Hives  . Probiotic [Acidophilus] Hives  . Azithromycin Nausea And Vomiting    UPSET STOMACH   . Bentyl [Dicyclomine Hcl] Diarrhea  . Erythromycin Nausea Only    STOMACH  CRAMPS  . Keflex [Cephalexin] Rash  . Lansoprazole Other (See Comments)    HEADACHE  . Nexium [Esomeprazole] Hives and Other (See Comments)    INTOLERANCE >  HEADACHE   . Sudafed [Pseudoephedrine Hcl] Other (See Comments)    TACHYCARDIA  . Sulfamethoxazole Rash  . Tetanus Toxoids Other (See Comments)    LOCAL  REACTION...REDNESS/KNOT    Current Facility-Administered Medications  Medication Dose Route Frequency Provider Last Rate Last Dose  . fentaNYL (SUBLIMAZE) 100 MCG/2ML injection           . vancomycin (VANCOCIN) IVPB 1000 mg/200 mL premix  1,000 mg Intravenous To SS-Surg Delight OvensGerhardt, Khanh Cordner B, MD        Pertinent items are noted in HPI.   Review of Systems:     Cardiac Review of Systems: Y or N  Chest Pain [ N   ]  Resting SOB [ N] Exertional SOB  Klaus.Mock[N  ]  Pollyann Kennedyrthopnea Klaus.Mock[N  ]   Pedal Edema Klaus.Mock[N  ]    Palpitations Klaus.Mock[N ] SN ]   Presyncope [ N  ]  General Review of Systems: [Y] = yes [  ]=no Constitional: recent weight change Klaus.Mock[N  ];  Wt loss over the last 3 months [   ] anorexia [  ]; fatigue [  ]; nausea [  ]; night sweats [ N ]; fever [ N ]; or chills N[  ];          Dental: poor dentition[N  ]; Last Dentist visit:   Eye : blurred vision [  ]; diplopia [   ]; vision changes [  ];  Amaurosis fugax[  ]; Resp: cough Klaus.Mock[N  ];  wheezing[N;  hemoptysis[ N ]; shortness of breath[ N ]; paroxysmal nocturnal dyspnea[ N]; dyspnea on exertion[  ]; or orthopnea[  ];  GI:  gallstones[  ], vomiting[  ];  dysphagia[  ]; melena[  ];  hematochezia [  ]; heartburn[  ];   Hx of  Colonoscopy[  ]; GU: kidney stones [  ]; hematuria[  ];   dysuria [  ];  nocturia[  ];  history of     obstruction [  ]; urinary frequency [  ]             Skin: rash, swelling[  ];, hair loss[  ];  peripheral edema[  ];  or itching[  ]; Musculosketetal: myalgias[  ];  joint swelling[  ];  joint erythema[  ];  joint pain[  ];  back pain[  ];  Heme/Lymph: bruising[  ];  bleeding[  ];  anemia[  ];  Neuro: TIA[  ];  headaches[  ];  stroke[  ];  vertigo[  ];  seizures[  ];   paresthesias[  ];  difficulty walking[  ];  Psych:depression[  ]; anxiety[  ];  Endocrine: diabetes[ N ];  thyroid dysfunction[Y  ];  Immunizations: Flu up to date [ Y ]; Pneumococcal up to date [ Y ];  Other:  Physical Exam: BP (!) 139/97   Pulse (!) 102   Temp 98.9 F (37.2  C) (Oral)   Resp 18   Ht 5\' 9"  (1.753 m)   Wt 155 lb 14.4 oz (70.7 kg)   SpO2 100%   BMI 23.02 kg/m   PHYSICAL EXAMINATION: Physical exam is unchanged from last week General appearance: alert, cooperative and appears stated age Head: Normocephalic, without obvious abnormality, atraumatic Neck: no adenopathy, no carotid bruit, no JVD, supple, symmetrical, trachea  midline and thyroid not enlarged, symmetric, no tenderness/mass/nodules Lymph nodes: Cervical, supraclavicular, and axillary nodes normal. and right supuclavicular fossa fills full, no definate node  Resp: clear to auscultation bilaterally Back: symmetric, no curvature. ROM normal. No CVA tenderness. Cardio: regular rate and rhythm, S1, S2 normal, no murmur, click, rub or gallop GI: soft, non-tender; bowel sounds normal; no masses,  no organomegaly Extremities: extremities normal, atraumatic, no cyanosis or edema and Homans sign is negative, no sign of DVT Neurologic: Grossly normal  Diagnostic Studies & Laboratory data:   Paper report of ct scan , unable view disk she brought with her     Nm Pet Image Initial (pi) Skull Base To Thigh  Result Date: 05/05/2017 CLINICAL DATA:  Initial Treatment strategy for mediastinal adenopathy. EXAM: NUCLEAR MEDICINE PET SKULL BASE TO THIGH TECHNIQUE: 12.3 mCi F-18 FDG was injected intravenously. Full-ring PET imaging was performed from the skull base to thigh after the radiotracer. CT data was obtained and used for attenuation correction and anatomic localization. FASTING BLOOD GLUCOSE:  Value: 95 mg/dl COMPARISON:  Left-sided facial CT dated 04/24/2017 FINDINGS: NECK No hypermetabolic lymph nodes in the neck. The nodular right lobe of the thyroid gland is not hypermetabolic, which typically indicates that the nodule is benign. CHEST Hypermetabolic prevascular, AP window, right paratracheal, right hilar, left hilar, and subcarinal adenopathy in the chest. Index AP window lymph node measures 1.8  cm in short axis on image 87/4 and has maximum standard uptake value 13.2. Right hilar node approximately 1.3 cm in short axis on image 92/4 with maximum SUV 11.7. A peribronchovascular nodule in the left upper lobe is only about 5 by 7 mm in size on image 79/4 but has a maximum standard uptake value of 4.6 Centrilobular emphysema. 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 ABDOMEN/PELVIS No abnormal hypermetabolic activity within the liver, pancreas, adrenal glands, or spleen. No hypermetabolic lymph nodes in the abdomen or pelvis. SKELETON No focal hypermetabolic activity to suggest skeletal metastasis. IMPRESSION: 1. Mediastinal and bilateral hilar adenopathy is considerably hypermetabolic and suspicious for malignancy. There is a small (5 by 7 mm) hypermetabolic nodule in the left upper lobe in the peribronchovascular region. This is a very small size to represent a primary, and may simply be a peribronchovascular malignant lymph node. Lymphoma is a distinct possibility and tissue diagnosis is recommended. 2. No hypermetabolic activity associated with the nodularity in the right thyroid gland. This usually is fairly specific for benign thyroid nodule. 3. Centrilobular emphysema. 4. Aberrant right subclavian artery passes behind the esophagus. Electronically Signed   By: Gaylyn Rong M.D.   On: 05/05/2017 14:31  I have independently reviewed the above radiology studies  and reviewed the findings with the patient.  Dg Chest 2 View  Result Date: 05/27/2017 CLINICAL DATA:  Lung nodule.  Preprocedural chest x-ray. EXAM: CHEST  2 VIEW COMPARISON:  05/14/2017.  PET-CT 05/05/2017. FINDINGS: Mediastinal and hilar fullness again noted. Reference is made to PET-CT of 05/05/2017 which demonstrates PET positive adenopathy. Previously identified left pulmonary nodule noted on PET-CT of 05/05/2017 is not definitely identified by chest x-ray. Mild left base subsegmental  atelectasis. No acute infiltrate noted. No pleural effusion or pneumothorax. IMPRESSION: 1. Mediastinal hilar fullness again noted. Reference is made to PET-CT of 05/05/2017 which demonstrates PET positive adenopathy. 2. Previously identified left pulmonary nodule noted on PET-CT of 05/05/2017 is not definite identified by chest x-ray. Mild left base subsegmental atelectasis . Electronically Signed   By:  Thomas  Register   On: 05/27/2017 10:18    Recent Lab Findings: Lab Results  Component Value Date   WBC 7.0 05/27/2017   HGB 13.9 05/27/2017   HCT 41.8 05/27/2017   PLT 183 05/27/2017   GLUCOSE 115 (H) 05/27/2017   ALT 25 05/27/2017   AST 22 05/27/2017   NA 137 05/27/2017   K 3.9 05/27/2017   CL 106 05/27/2017   CREATININE 0.72 05/27/2017   BUN 16 05/27/2017   CO2 22 05/27/2017   INR 1.04 05/27/2017      Assessment / Plan:   Patient patient had ebus and needle biopsies of number 7, 10 L and 10 R lymph nodes-the pathology did not reveal any definitive diagnosis and this was discussed with the patient and her mother.  I recommended that we proceed with left parasternal exploration/left VATS to directly biopsy the AP window nodes, which are the most prominent on PET scan.  We discussed doing this previously but the patient was reluctant to have any invasive procedure and finally agreed to ebus with biopsy.  I reviewed with her the need for a tissue diagnosis to be definitive, about the process with the mediastinal adenopathy documented on PET scan.  The patient is willing to proceed with left parasternal exploration/left VATS .  The goals risks and alternatives of the planned surgical procedure Procedure(s): LEFT VIDEO ASSISTED THORACOSCOPY (VATS)/  CHAMBERLAIN PROCEDURE, BIOPSY MEDIASTINAL  LYMPH NODES SAMPLING (Left) MEDIASTINOTOMY CHAMBERLAIN MCNEIL (Left)  have been discussed with the patient in detail. The risks of the procedure including death, infection, stroke, myocardial  infarction, bleeding, blood transfusion have all been discussed specifically.  I have quoted Kristin [name] a1 of perioperative mortality and a complication rate as high as 10%. The patient's questions have been answered.Kristin [name] is willing  to proceed with the planned procedure.     Aberrant right subclavian artery passes behind the esophagus  Delight Ovens MD      [address] [place] [place] [address] Office [phone]   Beeper [phone]  05/28/2017 9:27 AM

## 2017-05-28 NOTE — Anesthesia Preprocedure Evaluation (Addendum)
Anesthesia Evaluation  Patient identified by MRN, date of birth, ID band Patient awake    Reviewed: Allergy & Precautions, NPO status , Patient's Chart, lab work & pertinent test results  Airway Mallampati: II  TM Distance: >3 FB Neck ROM: Full    Dental no notable dental hx.    Pulmonary neg pulmonary ROS,    Pulmonary exam normal breath sounds clear to auscultation       Cardiovascular hypertension, Pt. on medications Normal cardiovascular exam Rhythm:Regular Rate:Normal     Neuro/Psych negative neurological ROS  negative psych ROS   GI/Hepatic Neg liver ROS, GERD  ,  Endo/Other  negative endocrine ROS  Renal/GU negative Renal ROS  negative genitourinary   Musculoskeletal negative musculoskeletal ROS (+)   Abdominal   Peds negative pediatric ROS (+)  Hematology negative hematology ROS (+)   Anesthesia Other Findings   Reproductive/Obstetrics negative OB ROS                             Anesthesia Physical Anesthesia Plan  ASA: II  Anesthesia Plan: General   Post-op Pain Management:    Induction: Intravenous  PONV Risk Score and Plan: 3 and Ondansetron, Dexamethasone, Midazolam and Treatment may vary due to age or medical condition  Airway Management Planned: Double Lumen EBT  Additional Equipment: Arterial line and CVP  Intra-op Plan:   Post-operative Plan: Extubation in OR  Informed Consent: I have reviewed the patients History and Physical, chart, labs and discussed the procedure including the risks, benefits and alternatives for the proposed anesthesia with the patient or authorized representative who has indicated his/her understanding and acceptance.   Dental advisory given  Plan Discussed with: CRNA and Surgeon  Anesthesia Plan Comments:         Anesthesia Quick Evaluation

## 2017-05-28 NOTE — Anesthesia Procedure Notes (Signed)
Anesthesia Procedure Image    

## 2017-05-28 NOTE — Anesthesia Postprocedure Evaluation (Signed)
Anesthesia Post Note  Patient: Claris H Carusone  Procedure(s) Performed: LEFT PERISTERNAL EXPLORATION, LEFT VIDEO ASSISTED THORACOSCOPY (VATS), MEDIASTINAL AND AP WINDOW LYMPH NODE DISSECTION (Left Chest)     Patient location during evaluation: PACU Anesthesia Type: General Level of consciousness: awake and alert Pain management: pain level controlled Vital Signs Assessment: post-procedure vital signs reviewed and stable Respiratory status: spontaneous breathing, nonlabored ventilation, respiratory function stable and patient connected to nasal cannula oxygen Cardiovascular status: blood pressure returned to baseline and stable Postop Assessment: no apparent nausea or vomiting Anesthetic complications: no    Last Vitals:  Vitals:   05/28/17 1215 05/28/17 1230  BP: 123/89 116/82  Pulse: 89   Resp: 14   Temp:    SpO2: 97%     Last Pain:  Vitals:   05/28/17 0818  TempSrc: Oral                 Keston Seever S

## 2017-05-28 NOTE — Progress Notes (Signed)
Patient ID: Kristin [name], female   DOB: 27-Mar-1966, 51 y.o.   MRN: [id number] EVENING ROUNDS NOTE :     [address]       [place] [id number]             [phone]                 Day of Surgery Procedure(s) (LRB): LEFT PERISTERNAL EXPLORATION, LEFT VIDEO ASSISTED THORACOSCOPY (VATS), MEDIASTINAL AND AP WINDOW LYMPH NODE DISSECTION (Left)  Total Length of Stay:  LOS: 0 days  BP 125/83 (BP Location: Right Arm)   Pulse 74   Temp 97.7 F (36.5 C)   Resp 10   Ht 5\' 9"  (1.753 m)   Wt 155 lb 14.4 oz (70.7 kg)   SpO2 97%   BMI 23.02 kg/m   .Intake/Output      11/29 0701 - 11/30 0700 11/30 0701 - 12/01 0700   I.V. (mL/kg)  1400 (19.8)   Total Intake(mL/kg)  1400 (19.8)   Urine (mL/kg/hr)  330   Blood  20   Chest Tube  14   Total Output  364   Net  +1036          . dextrose 5 % and 0.45 % NaCl with KCl 20 mEq/L 75 mL/hr at 05/28/17 1436  . potassium chloride       Lab Results  Component Value Date   WBC 7.0 05/27/2017   HGB 13.9 05/27/2017   HCT 41.8 05/27/2017   PLT 183 05/27/2017   GLUCOSE 115 (H) 05/27/2017   ALT 25 05/27/2017   AST 22 05/27/2017   NA 137 05/27/2017   K 3.9 05/27/2017   CL 106 05/27/2017   CREATININE 0.72 05/27/2017   BUN 16 05/27/2017   CO2 22 05/27/2017   INR 1.04 05/27/2017   Stable  Pain control stable No air leak from chest tube  Delight OvensEdward B Ahnyla Mendel MD  Beeper [phone] Office [phone] 05/28/2017 4:33 PM

## 2017-05-28 NOTE — Anesthesia Procedure Notes (Signed)
Central Venous Catheter Insertion Performed by: Eilene Ghaziose, Yariah Selvey, MD, anesthesiologist Start/End11/30/2018 9:25 AM, 05/28/2017 9:40 AM Patient location: Pre-op. Preanesthetic checklist: patient identified, IV checked, site marked, risks and benefits discussed, surgical consent, monitors and equipment checked, pre-op evaluation, timeout performed and anesthesia consent Position: Trendelenburg Lidocaine 1% used for infiltration and patient sedated Hand hygiene performed , maximum sterile barriers used  and Seldinger technique used Catheter size: 8 Fr Total catheter length 16. Central line was placed.Double lumen Procedure performed using ultrasound guided technique. Ultrasound Notes:anatomy identified, needle tip was noted to be adjacent to the nerve/plexus identified, no ultrasound evidence of intravascular and/or intraneural injection and image(s) printed for medical record Attempts: 1 Following insertion, dressing applied, line sutured and Biopatch. Post procedure assessment: blood return through all ports and no air  Patient tolerated the procedure well with no immediate complications.

## 2017-05-28 NOTE — Brief Op Note (Signed)
      [address]       [place] [id number]             [phone]     05/28/2017  11:58 AM  PATIENT:  Kristin [name]  51 y.o. female  PRE-OPERATIVE DIAGNOSIS:  Mediastinal Adenopathy  POST-OPERATIVE DIAGNOSIS:  Sarcoid  PROCEDURE:  Procedure(s): LEFT PERISTERNAL EXPLORATION, LEFT VIDEO ASSISTED THORACOSCOPY (VATS), MEDIASTINAL AND AP WINDOW LYMPH NODE DISSECTION (Left)  SURGEON:  Surgeon(s) and Role:    * Delight OvensGerhardt, Omeed Osuna B, MD - Primary   ASSISTANTS:Terry Wagnor RNFA ANESTHESIA:   general  EBL:  20 mL   BLOOD ADMINISTERED:none  DRAINS: 28 fr  Chest Tube(s) in the left pleural space    LOCAL MEDICATIONS USED:  NONE  SPECIMEN:  Source of Specimen:  mediastinal, and ap window nodes   DISPOSITION OF SPECIMEN:  and microbiology  COUNTS:  YES   DICTATION: .Dragon Dictation  PLAN OF CARE: Admit to inpatient   PATIENT DISPOSITION:  PACU - hemodynamically stable.   Delay start of Pharmacological VTE agent (>24hrs) due to surgical blood loss or risk of bleeding: yes

## 2017-05-28 NOTE — Transfer of Care (Signed)
Immediate Anesthesia Transfer of Care Note  Patient: Kristin [name]  Procedure(s) Performed: LEFT PERISTERNAL EXPLORATION, LEFT VIDEO ASSISTED THORACOSCOPY (VATS), MEDIASTINAL AND AP WINDOW LYMPH NODE DISSECTION (Left Chest)  Patient Location: PACU  Anesthesia Type:General  Level of Consciousness: drowsy  Airway & Oxygen Therapy: Patient Spontanous Breathing and Patient connected to nasal cannula oxygen  Post-op Assessment: Report given to RN and Post -op Vital signs reviewed and stable  Post vital signs: Reviewed and stable  Last Vitals:  Vitals:   05/28/17 0818  BP: (!) 139/97  Pulse: (!) 102  Resp: 18  Temp: 37.2 C  SpO2: 100%    Last Pain:  Vitals:   05/28/17 0818  TempSrc: Oral      Patients Stated Pain Goal: 3 (05/28/17 [id number])  Complications: No apparent anesthesia complications

## 2017-05-28 NOTE — Anesthesia Procedure Notes (Signed)
Arterial Line Insertion Start/End11/30/2018 9:15 AM, 05/28/2017 9:35 AM Performed by: Adair LaundryPaxton, Bailey Kolbe A, CRNA, CRNA  Patient location: Pre-op. Preanesthetic checklist: patient identified, IV checked, risks and benefits discussed, surgical consent, monitors and equipment checked and pre-op evaluation Lidocaine 1% used for infiltration and patient sedated Left, radial was placed Catheter size: 20 G Hand hygiene performed , maximum sterile barriers used  and Seldinger technique used  Attempts: 1 Procedure performed without using ultrasound guided technique. Following insertion, dressing applied and Biopatch. Post procedure assessment: normal

## 2017-05-28 NOTE — Anesthesia Procedure Notes (Signed)
Procedure Name: Intubation Date/Time: 05/28/2017 10:01 AM Performed by: Imagene Riches, CRNA Pre-anesthesia Checklist: Patient identified, Emergency Drugs available, Suction available and Patient being monitored Patient Re-evaluated:Patient Re-evaluated prior to induction Oxygen Delivery Method: Circle System Utilized Preoxygenation: Pre-oxygenation with 100% oxygen Induction Type: IV induction Ventilation: Mask ventilation without difficulty Laryngoscope Size: Mac and 3 Grade View: Grade I Tube type: Oral Endobronchial tube: Left, Double lumen EBT, EBT position confirmed by fiberoptic bronchoscope and EBT position confirmed by auscultation and 37 Fr Number of attempts: 1 Airway Equipment and Method: Stylet and Oral airway Placement Confirmation: ETT inserted through vocal cords under direct vision,  positive ETCO2 and breath sounds checked- equal and bilateral Secured at: 29 cm Tube secured with: Tape Dental Injury: Teeth and Oropharynx as per pre-operative assessment

## 2017-05-29 ENCOUNTER — Encounter (HOSPITAL_COMMUNITY): Payer: Self-pay | Admitting: Cardiothoracic Surgery

## 2017-05-29 ENCOUNTER — Other Ambulatory Visit: Payer: Self-pay

## 2017-05-29 ENCOUNTER — Inpatient Hospital Stay (HOSPITAL_COMMUNITY): Payer: 59

## 2017-05-29 LAB — CBC
HCT: 36.5 % (ref 36.0–46.0)
Hemoglobin: 11.4 g/dL — ABNORMAL LOW (ref 12.0–15.0)
MCH: 27.1 pg (ref 26.0–34.0)
MCHC: 31.2 g/dL (ref 30.0–36.0)
MCV: 86.7 fL (ref 78.0–100.0)
Platelets: 176 10*3/uL (ref 150–400)
RBC: 4.21 MIL/uL (ref 3.87–5.11)
RDW: 13.2 % (ref 11.5–15.5)
WBC: 10.8 10*3/uL — ABNORMAL HIGH (ref 4.0–10.5)

## 2017-05-29 LAB — BLOOD GAS, ARTERIAL
Acid-base deficit: 0.4 mmol/L (ref 0.0–2.0)
Bicarbonate: 23.9 mmol/L (ref 20.0–28.0)
Drawn by: 44166
O2 Content: 3 L/min
O2 Saturation: 99.1 %
Patient temperature: 97.7
pCO2 arterial: 39.2 mmHg (ref 32.0–48.0)
pH, Arterial: 7.399 (ref 7.350–7.450)
pO2, Arterial: 151 mmHg — ABNORMAL HIGH (ref 83.0–108.0)

## 2017-05-29 LAB — BASIC METABOLIC PANEL
Anion gap: 6 (ref 5–15)
BUN: 9 mg/dL (ref 6–20)
CO2: 26 mmol/L (ref 22–32)
Calcium: 8.8 mg/dL — ABNORMAL LOW (ref 8.9–10.3)
Chloride: 102 mmol/L (ref 101–111)
Creatinine, Ser: 0.69 mg/dL (ref 0.44–1.00)
GFR calc Af Amer: >60 mL/min (ref >60)
GFR calc non Af Amer: >60 mL/min (ref >60)
Glucose, Bld: 163 mg/dL — ABNORMAL HIGH (ref 65–99)
Potassium: 4.3 mmol/L (ref 3.5–5.1)
Sodium: 134 mmol/L — ABNORMAL LOW (ref 135–145)

## 2017-05-29 MED ORDER — DOXEPIN HCL 10 MG PO CAPS
10.0000 mg | ORAL_CAPSULE | Freq: Every day | ORAL | Status: DC
  Filled 2017-05-29: qty 1

## 2017-05-29 MED ORDER — LOSARTAN POTASSIUM 25 MG PO TABS
25.0000 mg | ORAL_TABLET | Freq: Every day | ORAL | Status: DC
  Administered 2017-05-29 – 2017-05-30 (×2): 25 mg via ORAL
  Filled 2017-05-29 (×2): qty 1

## 2017-05-29 NOTE — Progress Notes (Signed)
Patient ID: Jonna Munromie H Upshur, female   DOB: [birth year], 51 y.o.   MRN: [id number] TCTS DAILY ICU PROGRESS NOTE                   [address]            [place] [id number]          [phone]   1 Day Post-Op Procedure(s) (LRB): LEFT PERISTERNAL EXPLORATION, LEFT VIDEO ASSISTED THORACOSCOPY (VATS), MEDIASTINAL AND AP WINDOW LYMPH NODE DISSECTION (Left)  Total Length of Stay:  LOS: 1 day   Subjective: Alert and awake, up to chair without respiratory difficulty  Objective: Vital signs in last 24 hours: Temp:  [97.7 F (36.5 C)-98.3 F (36.8 C)] 97.8 F (36.6 C) (12/01 0300) Pulse Rate:  [50-95] 56 (12/01 0700) Cardiac Rhythm: Normal sinus rhythm;Sinus bradycardia (12/01 0400) Resp:  [8-22] 15 (12/01 0700) BP: (101-130)/(67-89) 112/71 (12/01 0700) SpO2:  [92 %-100 %] 93 % (12/01 0700) Arterial Line BP: (121-149)/(59-73) 143/73 (11/30 1420) Weight:  [158 lb 4.6 oz (71.8 kg)-158 lb 11.7 oz (72 kg)] 158 lb 11.7 oz (72 kg) (12/01 0500)  Filed Weights   05/28/17 0818 05/28/17 1745 05/29/17 0500  Weight: 155 lb 14.4 oz (70.7 kg) 158 lb 4.6 oz (71.8 kg) 158 lb 11.7 oz (72 kg)    Weight change:    Hemodynamic parameters for last 24 hours:    Intake/Output from previous day: 11/30 0701 - 12/01 0700 In: 2750 [P.O.:120; I.V.:2630] Out: 1935 [Urine:1825; Blood:20; Chest Tube:90]  Intake/Output this shift: No intake/output data recorded.  Current Meds: Scheduled Meds: . acetaminophen  1,000 mg Oral Q6H   Or  . acetaminophen (TYLENOL) oral liquid 160 mg/5 mL  1,000 mg Oral Q6H  . bisacodyl  10 mg Oral Daily  . enoxaparin (LOVENOX) injection  40 mg Subcutaneous Daily  . fentaNYL   Intravenous Q4H  . senna-docusate  1 tablet Oral QHS   Continuous Infusions: . dextrose 5 % and 0.45 % NaCl with KCl 20 mEq/L 75 mL/hr at 05/29/17 0700  . potassium chloride     PRN Meds:.diphenhydrAMINE **OR** diphenhydrAMINE, naloxone **AND** sodium chloride flush, ondansetron  (ZOFRAN) IV, oxyCODONE, potassium chloride, traMADol  General appearance: alert, cooperative and no distress Neurologic: intact Heart: regular rate and rhythm, S1, S2 normal, no murmur, click, rub or gallop Lungs: diminished breath sounds bibasilar Abdomen: soft, non-tender; bowel sounds normal; no masses,  no organomegaly Extremities: extremities normal, atraumatic, no cyanosis or edema and Homans sign is negative, no sign of DVT Wound: No air leak  Lab Results: CBC: Recent Labs    05/27/17 0913 05/29/17 0415  WBC 7.0 10.8*  HGB 13.9 11.4*  HCT 41.8 36.5  PLT 183 176   BMET:  Recent Labs    05/27/17 0913 05/29/17 0415  NA 137 134*  K 3.9 4.3  CL 106 102  CO2 22 26  GLUCOSE 115* 163*  BUN 16 9  CREATININE 0.72 0.69  CALCIUM 9.2 8.8*    CMET: Lab Results  Component Value Date   WBC 10.8 (H) 05/29/2017   HGB 11.4 (L) 05/29/2017   HCT 36.5 05/29/2017   PLT 176 05/29/2017   GLUCOSE 163 (H) 05/29/2017   ALT 25 05/27/2017   AST 22 05/27/2017   NA 134 (L) 05/29/2017   K 4.3 05/29/2017   CL 102 05/29/2017   CREATININE 0.69 05/29/2017   BUN 9 05/29/2017   CO2 26 05/29/2017   INR 1.04 05/27/2017  PT/INR:  Recent Labs    05/27/17 0913  LABPROT 13.5  INR 1.04   Radiology: Dg Chest Port 1 View  Result Date: 05/28/2017 CLINICAL DATA:  Postop VATS EXAM: PORTABLE CHEST 1 VIEW COMPARISON:  05/27/2017 FINDINGS: Right jugular central venous catheter with the tip projecting over the SVC. Left-sided chest tube directed towards the apex. Partial left upper lobectomy. Postsurgical changes in the left mid lung with atelectasis. Small left apical pneumothorax measuring less than 10%. No pleural effusion. Stable cardiomediastinal silhouette. No acute osseous abnormality. IMPRESSION: 1. Left-sided chest tube directed towards the apex. Small left apical pneumothorax measuring less than 10%. Partial left upper lobectomy with postsurgical changes. Electronically Signed   By:  Elige KoHetal  Patel   On: 05/28/2017 13:01     Assessment/Plan: S/P Procedure(s) (LRB): LEFT PERISTERNAL EXPLORATION, LEFT VIDEO ASSISTED THORACOSCOPY (VATS), MEDIASTINAL AND AP WINDOW LYMPH NODE DISSECTION (Left) Mobilize d/c tubes/lines Plan for transfer to step-down: see transfer orders Possible home tomorrow Will need follow-up with pulmonologist   Delight OvensEdward B Charidy Cappelletti 05/29/2017 8:51 AM

## 2017-05-29 NOTE — Discharge Instructions (Signed)
Thoracoscopy, Care After °Refer to this sheet in the next few weeks. These instructions provide you with information about caring for yourself after your procedure. Your health care provider may also give you more specific instructions. Your treatment has been planned according to current medical practices, but problems sometimes occur. Call your health care provider if you have any problems or questions after your procedure. °What can I expect after the procedure? °After your procedure, it is common to feel sore for up to two weeks. °Follow these instructions at home: °· There are many different ways to close and cover an incision, including stitches (sutures), skin glue, and adhesive strips. Follow your health care provider's instructions about: °? Incision care. °? Bandage (dressing) changes and removal. °? Incision closure removal. °· Check your incision area every day for signs of infection. Watch for: °? Redness, swelling, or pain. °? Fluid, blood, or pus. °· Take medicines only as directed by your health care provider. °· Try to cough often. Coughing helps to protect against lung infection (pneumonia). It may hurt to cough. If this happens, hold a pillow against your chest when you cough. °· Take deep breaths. This also helps to protect against pneumonia. °· If you were given an incentive spirometer, use it as directed by your health care provider. °· Do not take baths, swim, or use a hot tub until your health care provider approves. You may take showers. °· Avoid lifting until your health care provider approves. °· Avoid driving until your health care provider approves. °· Do not travel by airplane after the chest tube is removed until your health care provider approves. °Contact a health care provider if: °· You have a fever. °· Pain medicines do not ease your pain. °· You have redness, swelling, or increasing pain in your incision area. °· You develop a cough that does not go away, or you are coughing up  mucus that is yellow or green. °Get help right away if: °· You have fluid, blood, or pus coming from your incision. °· There is a bad smell coming from your incision or dressing. °· You develop a rash. °· You have difficulty breathing. °· You cough up blood. °· You develop light-headedness or you feel faint. °· You develop chest pain. °· Your heartbeat feels irregular or very fast. °This information is not intended to replace advice given to you by your health care provider. Make sure you discuss any questions you have with your health care provider. °Document Released: 01/02/2005 Document Revised: 02/16/2016 Document Reviewed: 02/28/2014 °Elsevier Interactive Patient Education © 2018 [employer] ° °

## 2017-05-29 NOTE — Progress Notes (Signed)
Fentanyl PCA 4ml flushed down sink. Witnessed by Loura Halthristine Hinshaw,RN

## 2017-05-29 NOTE — Progress Notes (Signed)
Pt received from 2H. Pt oriented to room and equipment. Telemetry applied, CCMD notified x2. VSS. Call bell within reach. Pt set up for lunch. Family at bedside. Pt denies needs. Will continue to monitor.   Leonidas Rombergaitlin S Bumbledare, RN

## 2017-05-29 NOTE — Discharge Summary (Signed)
Physician Discharge Summary       [address]       [place] [id number]             [phone]    Patient ID: Kristin [name] MRN: [id number] DOB/AGE: August 03, 1965 51 y.o.  Admit date: 05/28/2017 Discharge date: 05/30/2017  Admission Diagnoses: Mediastinal adenopathy  Active Diagnoses:  1. Sarcoid 2. Hypertension 3. GERD (gastroesophageal reflux disease) 4. Fibromyalgia 5. Anxiety 6. History of migraine headaches 7. IBS (irritable bowel syndrome) 8. Seasonal allergies 9. Vitamin D deficiency 10. Vertigo 11. Dizziness  Procedure (s):  LEFT PERISTERNAL EXPLORATION, LEFT VIDEO ASSISTED THORACOSCOPY (VATS), MEDIASTINAL AND AP WINDOW LYMPH NODE DISSECTION by Dr. Tyrone SageGerhardt on 05/28/2017.  Pathology: Final results pending  History of Presenting Illness: Kristin [name] 51 y.o. female is seen in the office   for 9-10 months of left face swelling.  She has a history of goiter with a thyroid biopsy done in 2013.  She has had ENT and dental evaluation for the face swelling.  She denies any history of fever chills night sweats, weight has been stable and 157 pounds.  The patient had [facility] 04/24/2017, type report notes marked lymphadenopathy tumor mass-effect circum-the left lateral aortic arch there is lymphadenopathy in the precarinal and paratracheal areas.  PET scan was performed, and then the patient agreed to bronchoscopy with ebus.    Patient returns today after having bronchoscopy with ebus and biopsy of #7, 10 R and 10 L lymph nodes: No malignant cells were identified, no additional diagnosis was made   Family history is significant for an identical twin sister who has asthma and hypertension.   Dr. Tyrone SageGerhardt recommended that we proceed with left parasternal exploration/left VATS to directly biopsy the AP window nodes, which are the most prominent on PET scan.  We discussed doing this previously but the patient  was reluctant to have any invasive procedure and finally agreed to ebus with biopsy.  Dr. Tyrone SageGerhardt reviewed with her the need for a tissue diagnosis to be definitive, about the process with the mediastinal adenopathy documented on PET scan.  The patient is willing to proceed with left parasternal exploration/left VATS . She was admitted on 05/28/2017 in order to undergo the aforementioned surgery.   Brief Hospital Course:  The patient remained afebrile and hemodynamically stable. A line and foley were removed early in the post operative course. Chest tube output gradually decreased and there was no air leak. Daily chest x rays were obtained and remained stable.Chest tube was removed on 05/29/2016. Patient is ambulating on room air. Patient is tolerating a diet and has had a bowel movement. Wounds are clean and dry. Final chest X ray showed stable,tiney left apical pneumothorax, bibasilar atelectasis, and small left pleural effusion. Patient is felt surgically stable for discharge today.   Latest Vital Signs: Blood pressure 123/73, pulse 87, temperature 97.9 F (36.6 C), temperature source Oral, resp. rate 20, height 5\' 9"  (1.753 m), weight 158 lb 8 oz (71.9 kg), SpO2 95 %.  Physical Exam: Cardiovascular: RRR Pulmonary: Clear to auscultation bilaterally Abdomen: Soft, non tender, bowel sounds present. Extremities:No lower extremity edema. Wounds: Clean and dry.  No erythema or signs of infection.  Discharge Condition: Stable and discharged to home.  Recent laboratory studies:  Lab Results  Component Value Date   WBC 12.0 (H) 05/30/2017   HGB 11.8 (L) 05/30/2017   HCT 37.2 05/30/2017  MCV 86.9 05/30/2017   PLT 196 05/30/2017   Lab Results  Component Value Date   NA 137 05/30/2017   K 3.5 05/30/2017   CL 104 05/30/2017   CO2 27 05/30/2017   CREATININE 0.72 05/30/2017   GLUCOSE 110 (H) 05/30/2017    Diagnostic Studies: Dg Chest 2 View  Result Date: 05/30/2017 CLINICAL DATA:   Status post VATS.  Chest tube removal. EXAM: CHEST  2 VIEW COMPARISON:  05/29/2017 FINDINGS: Interval removal of left chest tube. Tiny left apical pneumothorax. Bibasilar atelectasis. Heart is normal size. Small left effusion noted. IMPRESSION: Interval removal of left chest tube with tiny left apical pneumothorax. Bibasilar atelectasis, small left effusion. Electronically Signed   By: Charlett NoseKevin  Dover M.D.   On: 05/30/2017 08:35   Dg Chest 2 View  Result Date: 05/27/2017 CLINICAL DATA:  Lung nodule.  Preprocedural chest x-ray. EXAM: CHEST  2 VIEW COMPARISON:  05/14/2017.  PET-CT 05/05/2017. FINDINGS: Mediastinal and hilar fullness again noted. Reference is made to PET-CT of 05/05/2017 which demonstrates PET positive adenopathy. Previously identified left pulmonary nodule noted on PET-CT of 05/05/2017 is not definitely identified by chest x-ray. Mild left base subsegmental atelectasis. No acute infiltrate noted. No pleural effusion or pneumothorax. IMPRESSION: 1. Mediastinal hilar fullness again noted. Reference is made to PET-CT of 05/05/2017 which demonstrates PET positive adenopathy. 2. Previously identified left pulmonary nodule noted on PET-CT of 05/05/2017 is not definite identified by chest x-ray. Mild left base subsegmental atelectasis . Electronically Signed   By: Maisie Fushomas  Register   On: 05/27/2017 10:18   Dg Chest 2 View  Result Date: 05/14/2017 CLINICAL DATA:  Preoperative respiratory evaluation prior to bronchoscopy and possible mediastinoscopy, to be performed because of bilateral hilar and mediastinal lymphadenopathy, hypermetabolic on recent PET-CT. EXAM: CHEST  2 VIEW COMPARISON:  PET-CT 05/05/2017. Chest x-rays 01/20/2009 and earlier. FINDINGS: Cardiac silhouette normal in size. Thoracic aorta normal in appearance. AP window lymphadenopathy identified on the PET-CT causes a bulge in the mediastinal contour. Hilar and mediastinal contours otherwise unremarkable. Lungs clear. Bronchovascular  markings normal. Pulmonary vascularity normal. No visible pleural effusions. No pneumothorax. Visualized bony thorax intact. IMPRESSION: 1.  No acute cardiopulmonary disease. 2. Lymphadenopathy in the AP window region of the mediastinum. Electronically Signed   By: Hulan Saashomas  Lawrence M.D.   On: 05/14/2017 16:49   Nm Pet Image Initial (pi) Skull Base To Thigh  Result Date: 05/05/2017 CLINICAL DATA:  Initial Treatment strategy for mediastinal adenopathy. EXAM: NUCLEAR MEDICINE PET SKULL BASE TO THIGH TECHNIQUE: 12.3 mCi F-18 FDG was injected intravenously. Full-ring PET imaging was performed from the skull base to thigh after the radiotracer. CT data was obtained and used for attenuation correction and anatomic localization. FASTING BLOOD GLUCOSE:  Value: 95 mg/dl COMPARISON:  Left-sided facial CT dated 04/24/2017 FINDINGS: NECK No hypermetabolic lymph nodes in the neck. The nodular right lobe of the thyroid gland is not hypermetabolic, which typically indicates that the nodule is benign. CHEST Hypermetabolic prevascular, AP window, right paratracheal, right hilar, left hilar, and subcarinal adenopathy in the chest. Index AP window lymph node measures 1.8 cm in short axis on image 87/4 and has maximum standard uptake value 13.2. Right hilar node approximately 1.3 cm in short axis on image 92/4 with maximum SUV 11.7. A peribronchovascular nodule in the left upper lobe is only about 5 by 7 mm in size on image 79/4 but has a maximum standard uptake value of 4.6 Centrilobular emphysema. 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 ABDOMEN/PELVIS No abnormal hypermetabolic activity within the liver, pancreas, adrenal glands, or spleen. No hypermetabolic lymph nodes in the abdomen or pelvis. SKELETON No focal hypermetabolic activity to suggest skeletal metastasis. IMPRESSION: 1. Mediastinal and bilateral hilar adenopathy is considerably hypermetabolic and suspicious for malignancy. There  is a small (5 by 7 mm) hypermetabolic nodule in the left upper lobe in the peribronchovascular region. This is a very small size to represent a primary, and may simply be a peribronchovascular malignant lymph node. Lymphoma is a distinct possibility and tissue diagnosis is recommended. 2. No hypermetabolic activity associated with the nodularity in the right thyroid gland. This usually is fairly specific for benign thyroid nodule. 3. Centrilobular emphysema. 4. Aberrant right subclavian artery passes behind the esophagus. Electronically Signed   By: Gaylyn Rong M.D.   On: 05/05/2017 14:31   Dg Chest Port 1 View  Result Date: 05/29/2017 CLINICAL DATA:  Postop left parasternal expiration. EXAM: PORTABLE CHEST 1 VIEW COMPARISON:  05/28/2017 FINDINGS: 1048 hours. Left chest tube remains in place. Left apical pleural line seen previously is stable. Improved aeration left base. Right lung unremarkable. Right IJ central line tip overlies the mid SVC. The cardiopericardial silhouette is within normal limits for size. The visualized bony structures of the thorax are intact. Telemetry leads overlie the chest. IMPRESSION: 1. Left apical pleural line seen previously is stable compatible with tiny pneumothorax. 2. Improved aeration left lower lung. Electronically Signed   By: Kennith Center M.D.   On: 05/29/2017 11:05   Dg Chest Port 1 View  Result Date: 05/28/2017 CLINICAL DATA:  Postop VATS EXAM: PORTABLE CHEST 1 VIEW COMPARISON:  05/27/2017 FINDINGS: Right jugular central venous catheter with the tip projecting over the SVC. Left-sided chest tube directed towards the apex. Partial left upper lobectomy. Postsurgical changes in the left mid lung with atelectasis. Small left apical pneumothorax measuring less than 10%. No pleural effusion. Stable cardiomediastinal silhouette. No acute osseous abnormality. IMPRESSION: 1. Left-sided chest tube directed towards the apex. Small left apical pneumothorax measuring less  than 10%. Partial left upper lobectomy with postsurgical changes. Electronically Signed   By: Elige Ko   On: 05/28/2017 13:01     Discharge Medications: Allergies as of 05/30/2017      Reactions   Amoxicillin Hives   Has patient had a PCN reaction causing immediate rash, facial/tongue/throat swelling, SOB or lightheadedness with hypotension: UNKNOWN  Has patient had a PCN reaction causing severe rash involving mucus membranes or skin necrosis:  #  #  #  NO  #  #  #  Has patient had a PCN reaction that required hospitalization  #  #  #  NO  #  #  #  Has patient had a PCN reaction occurring within the last 10 years: #  #  #  YES  #  #  #  If all "NO's", may proceed with Cephalosporin use.   Doxycycline Hives   Indocin [indomethacin] Hives   Levaquin [levofloxacin] Hives   Probiotic [acidophilus] Hives   Azithromycin Nausea And Vomiting   UPSET STOMACH   Bentyl [dicyclomine Hcl] Diarrhea   Erythromycin Nausea Only   STOMACH CRAMPS   Keflex [cephalexin] Rash   Lansoprazole Other (See Comments)   HEADACHE   Nexium [esomeprazole] Hives, Other (See Comments)   INTOLERANCE >  HEADACHE   Sudafed [pseudoephedrine Hcl] Other (See Comments)   TACHYCARDIA   Sulfamethoxazole Rash   Tetanus Toxoids Other (See Comments)   LOCAL REACTION...REDNESS/KNOT  Medication List    TAKE these medications   acetaminophen 500 MG tablet Commonly known as:  TYLENOL Take 500-1,000 mg every 8 (eight) hours as needed by mouth for mild pain (depends on pain if takes 12 tablets).   aluminum-magnesium hydroxide-simethicone 200-200-20 MG/5ML Susp Commonly known as:  MAALOX Take 30 mLs by mouth 2 (two) times daily as needed.   Biotin 16109 MCG Tabs Take 1 tablet by mouth daily.   doxepin 10 MG capsule Commonly known as:  SINEQUAN Take 40-60 mg at bedtime by mouth. Depends on IBS symptoms and migraines if takes 4-6   FROVA 2.5 MG tablet Generic drug:  frovatriptan Take 2.5 mg as needed by mouth  for migraine (take 1 tablet at onset of migraine and repeat up to twice if needed). If recurs, may repeat after 2 hours. Max of 3 tabs in 24 hours.   IMODIUM A-D PO Take by mouth.   loperamide 2 MG capsule Commonly known as:  IMODIUM Take 2-4 mg as needed by mouth for diarrhea or loose stools.   loratadine 10 MG tablet Commonly known as:  CLARITIN Take 10 mg daily by mouth.   losartan 25 MG tablet Commonly known as:  COZAAR Take 25 mg every evening by mouth.   multivitamin tablet Take 1 tablet every other day by mouth.   RABEprazole 20 MG tablet Commonly known as:  ACIPHEX Take 20 mg by mouth daily.   simethicone 80 MG chewable tablet Commonly known as:  MYLICON Chew 80 mg every 6 (six) hours as needed by mouth for flatulence.   sodium chloride 0.65 % Soln nasal spray Commonly known as:  OCEAN Place 1 spray as needed into both nostrils for congestion.   SOOTHE XP OP Apply 2 drops at bedtime to eye.   traMADol 50 MG tablet Commonly known as:  ULTRAM Take 1 tablet (50 mg total) by mouth every 6 (six) hours as needed for moderate pain (mild pain).   Vitamin D3 5000 units Caps Take 5,000 Units daily by mouth.       Follow Up Appointments: Follow-up Information    Delight Ovens, MD Follow up.   Specialty:  Cardiothoracic Surgery Why:  PA/LAT CXR to be taken (at [facility] which is in the same building as Dr. Dennie Maizes office) one hour prior to office appointment;Office will call or mail appointment date and time Contact information: [address] [place] [address] [phone]           Signed: Lelon Huh [facility] 05/30/2017, 10:33 AM

## 2017-05-29 NOTE — Progress Notes (Signed)
Patient transferred to 4E Room 06 via wheelchair. No changes. Report given to Fayrene FearingJames, [place]

## 2017-05-29 NOTE — Op Note (Signed)
Kristin [name], Kristin [name]                 ACCOUNT NO.:  [id number]  MEDICAL RECORD NO.:  [id number]  LOCATION:  PERIO                        FACILITY:  [facility]  PHYSICIAN:  Sheliah PlaneEdward Jefferey Lippmann, MD    DATE OF BIRTH:  [birth year]  DATE OF PROCEDURE:  05/28/2017 DATE OF DISCHARGE:                              OPERATIVE REPORT   PREOPERATIVE DIAGNOSIS:  Mediastinal adenopathy, especially AP window nodes.  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  PROCEDURES PERFORMED:  Left parasternal exploration with video-assisted thoracoscopy and lymph node biopsy and lymph node excision, AP window nodes.  SURGEON:  Sheliah PlaneEdward Patra Gherardi, MD.  FIRST ASSISTANT:  Minda Dittoerry Wagner, RNFA.  BRIEF HISTORY:  The patient is a 51 year old female who had noted left neck swelling.  Ultimately, a CT scan and PET scan confirmed hypermetabolic mediastinal nodes, primarily in the AP window. Initially, the patient had agreed to bronchoscopy and EBUS, but no definitive diagnosis of sarcoid or malignancy could be made on this, but it was again recommended to her that we pursue further biopsying to make a definitive diagnosis and she agreed to left parasternal exploration and biopsy of the AP window nodes.  DESCRIPTION OF PROCEDURE:  The patient underwent general endotracheal anesthesia with a double-lumen endotracheal tube.  The left chest and anterior chest were prepped with Betadine and draped in usual sterile manner.  Appropriate time-out was performed.  We then proceeded with a small left parasternal incision at approximately the second intercostal space.  This dissection was carried down through the intercostal muscles avoiding the internal mammary node.  We were able to enter the chest. Through this incision.  A 5-mm video scope was introduced.  There were no obvious pleural lesions.  Through the incision, we could palpate an area of firmness in the AP window  area.  The pleura was excised over this area and 2 lymph nodes each approximately 2 cm in size and firm in character were excised.  A portion was sent for culture.  A portion was sent for frozen section and the remainder of the tissue was reserved for permanent examination.  With operative field hemostatic, a single Blake drain was placed into the left chest.  The lung was reinflated.  The incision was closed with interrupted 0 Vicryl, running 2-0 Vicryl in subcutaneous tissue, and 3-0 subcuticular stitch in skin edges.  The lung reinflated nicely.  The patient was then extubated in the operating room, transferred to the recovery room for further postoperative care. She tolerated the procedure without obvious complication.  Sponge and needle count was reported as correct at the completion of procedure.  Blood loss was minimal.  Initial frozen section showed a lymph node replaced with noncaseating granulomas consistent with sarcoid.     Sheliah PlaneEdward Sebasthian Stailey, MD     EG/MEDQ  D:  05/29/2017  T:  05/29/2017  Job:  [id number]

## 2017-05-30 ENCOUNTER — Inpatient Hospital Stay (HOSPITAL_COMMUNITY): Payer: 59

## 2017-05-30 LAB — CBC
HCT: 37.2 % (ref 36.0–46.0)
Hemoglobin: 11.8 g/dL — ABNORMAL LOW (ref 12.0–15.0)
MCH: 27.6 pg (ref 26.0–34.0)
MCHC: 31.7 g/dL (ref 30.0–36.0)
MCV: 86.9 fL (ref 78.0–100.0)
Platelets: 196 10*3/uL (ref 150–400)
RBC: 4.28 MIL/uL (ref 3.87–5.11)
RDW: 13.6 % (ref 11.5–15.5)
WBC: 12 10*3/uL — ABNORMAL HIGH (ref 4.0–10.5)

## 2017-05-30 LAB — COMPREHENSIVE METABOLIC PANEL
ALT: 31 U/L (ref 14–54)
AST: 32 U/L (ref 15–41)
Albumin: 3.1 g/dL — ABNORMAL LOW (ref 3.5–5.0)
Alkaline Phosphatase: 97 U/L (ref 38–126)
Anion gap: 6 (ref 5–15)
BUN: 12 mg/dL (ref 6–20)
CO2: 27 mmol/L (ref 22–32)
Calcium: 8.3 mg/dL — ABNORMAL LOW (ref 8.9–10.3)
Chloride: 104 mmol/L (ref 101–111)
Creatinine, Ser: 0.72 mg/dL (ref 0.44–1.00)
GFR calc Af Amer: >60 mL/min (ref >60)
GFR calc non Af Amer: >60 mL/min (ref >60)
Glucose, Bld: 110 mg/dL — ABNORMAL HIGH (ref 65–99)
Potassium: 3.5 mmol/L (ref 3.5–5.1)
Sodium: 137 mmol/L (ref 135–145)
Total Bilirubin: 0.6 mg/dL (ref 0.3–1.2)
Total Protein: 5.9 g/dL — ABNORMAL LOW (ref 6.5–8.1)

## 2017-05-30 MED ORDER — POTASSIUM CHLORIDE CRYS ER 20 MEQ PO TBCR
40.0000 meq | EXTENDED_RELEASE_TABLET | Freq: Once | ORAL | Status: DC
  Filled 2017-05-30: qty 2

## 2017-05-30 MED ORDER — TRAMADOL HCL 50 MG PO TABS: 50.0000 mg | ORAL_TABLET | Freq: Four times a day (QID) | ORAL | 0 refills | Status: DC | PRN

## 2017-05-30 NOTE — Progress Notes (Addendum)
      [address]       [place] [id number]             [phone]       2 Days Post-Op Procedure(s) (LRB): LEFT PERISTERNAL EXPLORATION, LEFT VIDEO ASSISTED THORACOSCOPY (VATS), MEDIASTINAL AND AP WINDOW LYMPH NODE DISSECTION (Left)  Subjective: Kristin [name] states she was opening bottle of mouthwash and felt pain and pulling at her incision site. She is passing flatus.  Objective: Vital signs in last 24 hours: Temp:  [97.6 F (36.4 C)-99.1 F (37.3 C)] 97.9 F (36.6 C) (12/02 0535) Pulse Rate:  [70-93] 87 (12/01 1215) Cardiac Rhythm: Normal sinus rhythm (12/02 0700) Resp:  [12-21] 20 (12/02 0535) BP: (123-147)/(73-94) 123/73 (12/02 0535) SpO2:  [95 %-98 %] 95 % (12/02 0535) Weight:  [158 lb 8 oz (71.9 kg)] 158 lb 8 oz (71.9 kg) (12/02 0535)     Intake/Output from previous day: 12/01 0701 - 12/02 0700 In: 705 [P.O.:480; I.V.:225] Out: 2125 [Urine:2125]   Physical Exam:  Cardiovascular: RRR Pulmonary: Clear to auscultation bilaterally Abdomen: Soft, non tender, bowel sounds present. Extremities:No lower extremity edema. Wounds: Clean and dry.  No erythema or signs of infection.   Lab Results: CBC: Recent Labs    05/29/17 0415 05/30/17 0230  WBC 10.8* 12.0*  HGB 11.4* 11.8*  HCT 36.5 37.2  PLT 176 196   BMET:  Recent Labs    05/29/17 0415 05/30/17 0230  NA 134* 137  K 4.3 3.5  CL 102 104  CO2 26 27  GLUCOSE 163* 110*  BUN 9 12  CREATININE 0.69 0.72  CALCIUM 8.8* 8.3*    PT/INR:  Recent Labs    05/27/17 0913  LABPROT 13.5  INR 1.04   ABG:  INR: Will add last result for INR, ABG once components are confirmed Will add last 4 CBG results once components are confirmed  Assessment/Plan:  1. CV - SR in the 60-70's. 2.  Pulmonary - On room air. CXR this am ordered but not taken yet. 3. Mild anemia- H and H 11.8 and 37.2 4. Supplement potassium 5. Await CXR results and possible discharge.  Donielle M  ZimmermanPA-C 05/30/2017,7:18 AM   Dg Chest 2 View  Result Date: 05/30/2017 CLINICAL DATA:  Status post VATS.  Chest tube removal. EXAM: CHEST  2 VIEW COMPARISON:  05/29/2017 FINDINGS: Interval removal of left chest tube. Tiny left apical pneumothorax. Bibasilar atelectasis. Heart is normal size. Small left effusion noted. IMPRESSION: Interval removal of left chest tube with tiny left apical pneumothorax. Bibasilar atelectasis, small left effusion. Electronically Signed   By: Charlett NoseKevin  Dover M.D.   On: 05/30/2017 08:35   Kristin [name] notes she still feels weak , will increase ambulation today and plan on d/c tomorrow  I have seen and examined Kristin [name] and agree with the above assessment  and plan.  Delight OvensEdward B Aron Needles MD Bee[address] [phone] Office [phone] 05/30/2017 11:44 AM

## 2017-05-31 MED ORDER — TRAMADOL HCL 50 MG PO TABS: 50.0000 mg | ORAL_TABLET | Freq: Four times a day (QID) | ORAL | 0 refills | Status: DC | PRN

## 2017-05-31 NOTE — Progress Notes (Signed)
      [address]       [place] [id number]             [phone]      3 Days Post-Op Procedure(s) (LRB): LEFT PERISTERNAL EXPLORATION, LEFT VIDEO ASSISTED THORACOSCOPY (VATS), MEDIASTINAL AND AP WINDOW LYMPH NODE DISSECTION (Left)   Subjective:  No new complaints.  Feeling better this morning.  Multiple questions addressed and answered  Objective: Vital signs in last 24 hours: Temp:  [97.5 F (36.4 C)-98.9 F (37.2 C)] 97.5 F (36.4 C) (12/03 0612) Pulse Rate:  [75-86] 75 (12/03 0613) Cardiac Rhythm: Normal sinus rhythm (12/03 0700) Resp:  [16-22] 22 (12/03 0613) BP: (116-130)/(81-91) 121/83 (12/03 0612) SpO2:  [94 %-98 %] 97 % (12/03 0613)  Intake/Output from previous day: 12/02 0701 - 12/03 0700 In: 240 [P.O.:240] Out: -   General appearance: alert, cooperative and no distress Heart: regular rate and rhythm Lungs: clear to auscultation bilaterally Abdomen: soft, non-tender; bowel sounds normal; no masses,  no organomegaly Extremities: extremities normal, atraumatic, no cyanosis or edema Wound: clean and dry  Lab Results: Recent Labs    05/29/17 0415 05/30/17 0230  WBC 10.8* 12.0*  HGB 11.4* 11.8*  HCT 36.5 37.2  PLT 176 196   BMET:  Recent Labs    05/29/17 0415 05/30/17 0230  NA 134* 137  K 4.3 3.5  CL 102 104  CO2 26 27  GLUCOSE 163* 110*  BUN 9 12  CREATININE 0.69 0.72  CALCIUM 8.8* 8.3*    PT/INR: No results for input(s): LABPROT, INR in the last 72 hours. ABG    Component Value Date/Time   PHART 7.399 05/29/2017 0240   HCO3 23.9 05/29/2017 0240   TCO2 21 01/20/2009 1549   ACIDBASEDEF 0.4 05/29/2017 0240   O2SAT 99.1 05/29/2017 0240   CBG (last 3)  No results for input(s): GLUCAP in the last 72 hours.  Assessment/Plan: S/P Procedure(s) (LRB): LEFT PERISTERNAL EXPLORATION, LEFT VIDEO ASSISTED THORACOSCOPY (VATS), MEDIASTINAL AND AP WINDOW LYMPH NODE DISSECTION (Left)  1. CV- hemodynamically stable 2. Pulm- no  acute issues, continue IS 3. Dispo- patient stable, will d/c  Home today   LOS: 3 days    Kristin [name] 05/31/2017

## 2017-05-31 NOTE — Care Management Note (Signed)
Case Management Note Donn PieriniKristi Ivalene Platte RN, BSN Unit 4E-Case Manager [phone]  Patient Details  Name: Kristin [name] MRN: [id number] Date of Birth: 02/20/1966  Subjective/Objective:    Pt admitted s/p VATS on 11/30                Action/Plan: PTA pt lived at home with family- plan to return home- stable for d/c today- no CM needs noted for discharge  Expected Discharge Date:  05/31/17               Expected Discharge Plan:  Home/Self Care  In-House Referral:  NA  Discharge planning Services  CM Consult  Post Acute Care Choice:  NA Choice offered to:  NA  DME Arranged:  N/A DME Agency:  NA  HH Arranged:  NA HH Agency:  NA  Status of Service:  Completed, signed off  If discussed at Long Length of Stay Meetings, dates discussed:    Discharge Disposition: home/self care  Additional Comments:  Kristin [name], Kristin Haan Hall, RN 05/31/2017, 10:49 AM

## 2017-05-31 NOTE — Progress Notes (Signed)
Chest tube suture removed, per order. Site clean and dry. Benzoin and steri-strip applied.   Berdine DanceLauren Moffitt BSN, RN

## 2017-05-31 NOTE — Progress Notes (Signed)
Pt discharged home with family. IV and telemetry box removed. Pt received discharge instructions and all questions were answered. Pt received prescription for tramadol. Pt left with all of her belongings. Pt discharged via wheelchair and was [facility] and pt's family.   Berdine DanceLauren Moffitt BSN, RN

## 2017-06-02 ENCOUNTER — Telehealth: Payer: Self-pay

## 2017-06-02 LAB — AEROBIC/ANAEROBIC CULTURE W GRAM STAIN (SURGICAL/DEEP WOUND): Culture: NO GROWTH

## 2017-06-02 NOTE — Telephone Encounter (Signed)
Expressed to patient she is able to sleep on her side, whichever is most comfortable to her and do what her body is able to tolerate.  Patient told she could shower every other day instead of daily as long as the incision sites were kept clean.  Patient acknowledged education.

## 2017-06-07 ENCOUNTER — Telehealth: Payer: Self-pay

## 2017-06-07 NOTE — Telephone Encounter (Signed)
Patient called and left a message concerned about possible wound infection from procedure done 05/28/2017.  Patient spoke with Dr. Laneta SimmersBartle who was on call over the weekend.  Patient said Dr. Laneta SimmersBartle answered all her questions.  She asked for signs and symptoms for infection, and I reiterated what Dr. Laneta SimmersBartle said in that infection signs and symptoms would be redness, red streaks, puss coming from the incision site, fowl drainage coming from the site, temperature, and swelling accompanied with these s/s.  Patient stated she did not have a fever and the signs/ symptoms described did not seem to match hers.  Patient told to call if she had any further questions or concerns.  Patient acknowledged receipt.

## 2017-06-15 LAB — ACID FAST SMEAR (AFB, MYCOBACTERIA): Acid Fast Smear: NEGATIVE

## 2017-06-16 ENCOUNTER — Other Ambulatory Visit: Payer: Self-pay | Admitting: Cardiothoracic Surgery

## 2017-06-16 DIAGNOSIS — E042 Nontoxic multinodular goiter: Secondary | ICD-10-CM

## 2017-06-17 ENCOUNTER — Encounter: Payer: Self-pay | Admitting: Cardiothoracic Surgery

## 2017-06-17 ENCOUNTER — Other Ambulatory Visit: Payer: Self-pay

## 2017-06-17 ENCOUNTER — Ambulatory Visit: Payer: Self-pay

## 2017-06-17 DIAGNOSIS — Z5189 Encounter for other specified aftercare: Secondary | ICD-10-CM

## 2017-06-17 NOTE — Progress Notes (Signed)
Kristin [name] came in with concern about her wound s/p surgery 05/28/2017.  She was concerned about it possibly being infected.  Wound was cleaned and dried and looked to be healing nicely, no signs of symptoms of being infected.  She was told to make sure she kept the wound clean and dry.  She was also told she could wash it with soap and water.  She was told to call with any questions or concerns.

## 2017-06-25 ENCOUNTER — Encounter: Payer: Self-pay | Admitting: Cardiothoracic Surgery

## 2017-06-25 ENCOUNTER — Ambulatory Visit (INDEPENDENT_AMBULATORY_CARE_PROVIDER_SITE_OTHER): Payer: Self-pay | Admitting: Cardiothoracic Surgery

## 2017-06-25 ENCOUNTER — Ambulatory Visit
Admission: RE | Admit: 2017-06-25 | Discharge: 2017-06-25 | Disposition: A | Discharge status: Home / Self Care | Payer: 59 | Source: Ambulatory Visit | Attending: Cardiothoracic Surgery | Admitting: Cardiothoracic Surgery

## 2017-06-25 ENCOUNTER — Other Ambulatory Visit: Payer: Self-pay

## 2017-06-25 VITALS — BP 127/87 | HR 88 | Resp 18 | Wt 155.4 lb

## 2017-06-25 DIAGNOSIS — R590 Localized enlarged lymph nodes: Secondary | ICD-10-CM

## 2017-06-25 DIAGNOSIS — E042 Nontoxic multinodular goiter: Secondary | ICD-10-CM

## 2017-06-25 DIAGNOSIS — D869 Sarcoidosis, unspecified: Secondary | ICD-10-CM

## 2017-06-25 NOTE — Progress Notes (Signed)
[address]       [place] [id number]             [phone]      [facility] Medical Record #[id number] Date of Birth: [birth year]  Referring: Glenford PeersNeijstrom, Eric, MD Primary Care: Pearson ForsterKelly, [name] S, MD Primary Cardiologist: No primary care provider on file.   Chief Complaint:   POST OP FOLLOW UP 05/28/2017  OPERATIVE REPORT PREOPERATIVE DIAGNOSIS:  Mediastinal adenopathy, especially AP window nodes. 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 PROCEDURES PERFORMED:  Left parasternal exploration with video-assisted thoracoscopy and lymph node biopsy and lymph node excision, AP window nodes. SURGEON:  Sheliah PlaneEdward Viney Acocella, MD.    History of Present Illness:     Patient doing well postop, with some incisional discomfort .   Diagnosis 1. Lymph node, biopsy, Mediastinal - NON-CASEATING GRANLUOMAS. - NO MALIGNANCY IDENTIFIED. 2. Lymph node, biopsy, AP window - NON-CASEATING GRANLUOMAS. - NO MALIGNANCY IDENTIFIED. Microscopic Comment 1. and 2. There are numerous well formed and confluent granluomas without necrosis. PAS, GMS, and AFB are negative for organisms. The differential includes sarcoid. Valinda HoarJULIA MANNY MD Pathologist, Electronic Signature (Case signed 05/31/2017)    Past Medical History:  Diagnosis Date  . Anxiety   . Dizziness   . Dysrhythmia    palpitations  . Fibromyalgia 04/13/2017  . GERD (gastroesophageal reflux disease)   . Headache   . History of migraine headaches   . Hypertension    hx arrhythmia  . IBS (irritable bowel syndrome)   . Lymphadenopathy   . Seasonal allergies   . Thyroid nodule   . Vertigo   . Vitamin D deficiency      Social History   Tobacco Use  Smoking Status Never Smoker  Smokeless Tobacco Never Used    Social History   Substance and Sexual Activity  Alcohol Use No     Allergies  Allergen Reactions  . Amoxicillin  Hives    Has patient had a PCN reaction causing immediate rash, facial/tongue/throat swelling, SOB or lightheadedness with hypotension: UNKNOWN  Has patient had a PCN reaction causing severe rash involving mucus membranes or skin necrosis:  #  #  #  NO  #  #  #  Has patient had a PCN reaction that required hospitalization  #  #  #  NO  #  #  #  Has patient had a PCN reaction occurring within the last 10 years: #  #  #  YES  #  #  #  If all "NO's", may proceed with Cephalosporin use.  Marland Kitchen. Doxycycline Hives  . Indocin [Indomethacin] Hives  . Levaquin [Levofloxacin] Hives  . Probiotic [Acidophilus] Hives  . Azithromycin Nausea And Vomiting    UPSET STOMACH   . Bentyl [Dicyclomine Hcl] Diarrhea  . Erythromycin Nausea Only    STOMACH CRAMPS  . Keflex [Cephalexin] Rash  . Lansoprazole Other (See Comments)    HEADACHE  . Nexium [Esomeprazole] Hives and Other (See Comments)    INTOLERANCE >  HEADACHE   . Sudafed [Pseudoephedrine Hcl] Other (See Comments)    TACHYCARDIA  . Sulfamethoxazole Rash  . Tetanus Toxoids Other (See Comments)    LOCAL REACTION...REDNESS/KNOT    Current Outpatient Medications  Medication Sig Dispense Refill  . acetaminophen (TYLENOL) 500 MG tablet Take 500-1,000 mg every 8 (eight) hours as needed by mouth for mild pain (depends on pain if takes 12 tablets).     .Marland Kitchen  aluminum-magnesium hydroxide-simethicone (MAALOX) 200-200-20 MG/5ML SUSP Take 30 mLs by mouth 2 (two) times daily as needed.    . Artificial Tear Solution (SOOTHE XP OP) Apply 2 drops at bedtime to eye.    . Biotin 60454 MCG TABS Take 1 tablet by mouth daily.    . Cholecalciferol (VITAMIN D3) 5000 units CAPS Take 5,000 Units daily by mouth.     . doxepin (SINEQUAN) 10 MG capsule Take 40-60 mg at bedtime by mouth. Depends on IBS symptoms and migraines if takes 4-6    . frovatriptan (FROVA) 2.5 MG tablet Take 2.5 mg as needed by mouth for migraine (take 1 tablet at onset of migraine and repeat up to twice if  needed). If recurs, may repeat after 2 hours. Max of 3 tabs in 24 hours.     Marland Kitchen loperamide (IMODIUM) 2 MG capsule Take 2-4 mg as needed by mouth for diarrhea or loose stools.    . Loperamide HCl (IMODIUM A-D PO) Take by mouth.    . loratadine (CLARITIN) 10 MG tablet Take 10 mg daily by mouth.     . losartan (COZAAR) 25 MG tablet Take 25 mg every evening by mouth.     . Multiple Vitamin (MULTIVITAMIN) tablet Take 1 tablet every other day by mouth.     . RABEprazole (ACIPHEX) 20 MG tablet Take 20 mg by mouth daily.    . simethicone (MYLICON) 80 MG chewable tablet Chew 80 mg every 6 (six) hours as needed by mouth for flatulence.    . sodium chloride (OCEAN) 0.65 % SOLN nasal spray Place 1 spray as needed into both nostrils for congestion.    . traMADol (ULTRAM) 50 MG tablet Take 1 tablet (50 mg total) by mouth every 6 (six) hours as needed for moderate pain (mild pain). 40 tablet 0   No current facility-administered medications for this visit.        Physical Exam: BP 127/87 (BP Location: Left Arm, Patient Position: Sitting, Cuff Size: Normal)   Pulse 88   Resp 18   Wt 155 lb 6.4 oz (70.5 kg)   SpO2 98% Comment: on Ra  BMI 22.95 kg/m   General appearance: alert and cooperative Neurologic: intact Heart: regular rate and rhythm, S1, S2 normal, no murmur, click, rub or gallop Lungs: clear to auscultation bilaterally Abdomen: soft, non-tender; bowel sounds normal; no masses,  no organomegaly Extremities: extremities normal, atraumatic, no cyanosis or edema and Homans sign is negative, no sign of DVT Wound: left chest incision healing well    Diagnostic Studies & Laboratory data:     Recent Radiology Findings:  Dg Chest 2 View  Result Date: 06/25/2017 CLINICAL DATA:  Multinodular goiter, left VATS exploration with lymph node sampling, shortness of breath EXAM: CHEST  2 VIEW COMPARISON:  05/30/2017, 05/29/2017 FINDINGS: No acute pulmonary infiltrate or effusion. Normal  cardiomediastinal silhouette. Resolution of previously noted tiny left apical pneumothorax. IMPRESSION: No active cardiopulmonary disease. Electronically Signed   By: Jasmine Pang M.D.   On: 06/25/2017 13:51   I have independently reviewed the above radiology studies  and reviewed the findings with the patient.   Recent Lab Findings: Lab Results  Component Value Date   WBC 12.0 (H) 05/30/2017   HGB 11.8 (L) 05/30/2017   HCT 37.2 05/30/2017   PLT 196 05/30/2017   GLUCOSE 110 (H) 05/30/2017   ALT 31 05/30/2017   AST 32 05/30/2017   NA 137 05/30/2017   K 3.5 05/30/2017   CL 104 05/30/2017  CREATININE 0.72 05/30/2017   BUN 12 05/30/2017   CO2 27 05/30/2017   INR 1.04 05/27/2017      Assessment / Plan:    Patient doing well post biopsy. I again reviewed with her the pathological dx. I suggested to the patient that she needs to be  seen by Pulmonary. She was unsure if [place] wanted [place] in [place] or [place]  I have given her the [facility],number. She will let us know if she would like assistance with referral   Plan to see her back as needed.      Delight OvensEdward B Yonis Carreon MD      [address] [place] [id number] Office [phone]   Beeper [phone]  06/27/2017 7:56 PM

## 2017-06-28 LAB — FUNGUS CULTURE WITH STAIN

## 2017-06-28 LAB — FUNGUS CULTURE RESULT

## 2017-06-28 LAB — FUNGAL ORGANISM REFLEX

## 2017-07-09 ENCOUNTER — Encounter: Payer: Self-pay | Admitting: Cardiothoracic Surgery

## 2017-07-11 LAB — ACID FAST CULTURE WITH REFLEXED SENSITIVITIES (MYCOBACTERIA): Acid Fast Culture: NEGATIVE

## 2017-07-29 ENCOUNTER — Institutional Professional Consult (permissible substitution): Payer: 59 | Admitting: Pulmonary Disease

## 2017-07-29 ENCOUNTER — Encounter: Payer: Self-pay | Admitting: Pulmonary Disease

## 2017-07-29 ENCOUNTER — Other Ambulatory Visit: Payer: 59

## 2017-07-29 ENCOUNTER — Ambulatory Visit: Payer: 59 | Admitting: Pulmonary Disease

## 2017-07-29 VITALS — BP 132/84 | HR 104 | Ht 69.0 in | Wt 154.0 lb

## 2017-07-29 DIAGNOSIS — D869 Sarcoidosis, unspecified: Secondary | ICD-10-CM

## 2017-07-29 NOTE — Patient Instructions (Signed)
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  Fatigue/muscle weakness: As we stated today sarcoid could be at play, but I think the treatment would cause more harm than good I encourage you to exercise vigorously  We will see you back in May 2019

## 2017-07-29 NOTE — Progress Notes (Signed)
Subjective:   PATIENT ID: Kristin [name] GENDER: female DOB: [birth year], MRN: [id number]  Synopsis: Referred in January 2019 for sarcoidosis diagnosed on a lymph node biopsy in 2018. 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  HPI  Chief Complaint  Patient presents with  . New Consult    productive cough, refered by Dr. Clover Mealy for sarcoidosis    [name] is here to see me for evaluation of possible sarcoidosis.  She was found to have a lymphadenopathy on 2018 on a CXR.  In January 2018 she says that when she had the flu she noticed some left facial swelling and she kept an eye on this.  She said that this never really went away.  She kept an eye on this but otherwise recovered from the flu. In April 2018 she saw an ENT physician for TMJ type pain.  Several months later she had pain in the same area on the left jaw/face and was found to have an abnormal lymph node by exam from her dentist in October 2018.  She saw her PCP and ended up having a CT scan of the neck that showed the lymph nodes in her chest.  She had a PET scan later that was positive for hypermetabolic lymph nodes in her chest.    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 She wonders if the fatigue comes from the sarcoid, though she wonders if it is related to her fibromyalgia.  She says that sometimes it feel like she can't get a deep breath.  She doesn't have chest pain.  If she moves really quickly with exertion she may have a little shortness of breath.  She doesn't exercise.  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 She had a cough, laryngitis, sinus congestion. She felt like the mucus she would cough up came  from her throat.  She took an antibiotic last weekend and she is now better.    She never smoked.    She never smoked.  She has an identical twin and she has been told she has asthma.  She says that as a child her sister had asthma.    Past Medical History:  Diagnosis Date  . Anxiety   . Dizziness   . Dysrhythmia    palpitations  . Fibromyalgia 04/13/2017  . GERD (gastroesophageal reflux disease)   . Headache   . History of migraine headaches   . Hypertension    hx arrhythmia  . IBS (irritable bowel syndrome)   . Lymphadenopathy   . Seasonal allergies   . Thyroid nodule   . Vertigo   . Vitamin D deficiency      Family History  Problem Relation Age of Onset  . Hypertension Mother   . Diabetes Mother   . Hyperthyroidism Mother   . Hypertension Father   . Heart disease Father   . Hypothyroidism Father   . Cardiomyopathy Father   . Other Father 6       CAUSE OF DEATH..CEREBRAL HEMORR  . Cardiomyopathy Paternal Uncle   . Heart disease Paternal Uncle  CABG  . Diabetes Maternal Grandmother   . Hypertension Maternal Grandmother   . Heart disease Maternal Grandmother        MI  . Other Maternal Grandmother        GRAVES DISEASE  . Cardiomyopathy Maternal Grandmother   . Other Maternal Grandfather        AAA  . Cardiomyopathy Maternal Grandfather   . Heart disease Maternal Grandfather        MI  . Cancer Paternal Grandmother        BREAST, OTHER UNSPECIFIED SITE  . Hypertension Paternal Grandfather   . Stroke Paternal Grandfather        ISCHEMIC STROKE  . Other Maternal Aunt        THYROID DISEASE  . Heart disease Paternal Aunt        CABG  . Cardiomyopathy Paternal Aunt   . Cancer Paternal Aunt        STOMACH  . Other Cousin        THYROID      Social History   Socioeconomic History  . Marital status: Single    Spouse name: Not on file  . Number of children: Not on file  . Years of education: Not on file  . Highest education level: Not on file    Social Needs  . Financial resource strain: Not on file  . Food insecurity - worry: Not on file  . Food insecurity - inability: Not on file  . Transportation needs - medical: Not on file  . Transportation needs - non-medical: Not on file  Occupational History  . Not on file  Tobacco Use  . Smoking status: Never Smoker  . Smokeless tobacco: Never Used  Substance and Sexual Activity  . Alcohol use: No  . Drug use: No  . Sexual activity: No    Partners: Male  Other Topics Concern  . Not on file  Social History Narrative  . Not on file     Allergies  Allergen Reactions  . Amoxicillin Hives    Has patient had a PCN reaction causing immediate rash, facial/tongue/throat swelling, SOB or lightheadedness with hypotension: UNKNOWN  Has patient had a PCN reaction causing severe rash involving mucus membranes or skin necrosis:  #  #  #  NO  #  #  #  Has patient had a PCN reaction that required hospitalization  #  #  #  NO  #  #  #  Has patient had a PCN reaction occurring within the last 10 years: #  #  #  YES  #  #  #  If all "NO's", may proceed with Cephalosporin use.  Marland Kitchen Doxycycline Hives  . Indocin [Indomethacin] Hives  . Levaquin [Levofloxacin] Hives  . Probiotic [Acidophilus] Hives  . Azithromycin Nausea And Vomiting    UPSET STOMACH   . Bentyl [Dicyclomine Hcl] Diarrhea  . Erythromycin Nausea Only    STOMACH CRAMPS  . Keflex [Cephalexin] Rash  . Lansoprazole Other (See Comments)    HEADACHE  . Nexium [Esomeprazole] Hives and Other (See Comments)    INTOLERANCE >  HEADACHE   . Sudafed [Pseudoephedrine Hcl] Other (See Comments)    TACHYCARDIA  . Sulfamethoxazole Rash  . Tetanus Toxoids Other (See Comments)    LOCAL REACTION...REDNESS/KNOT     Outpatient Medications Prior to Visit  Medication Sig Dispense Refill  . acetaminophen (TYLENOL) 500 MG tablet Take 500-1,000 mg every 8 (eight) hours as needed by mouth for mild  pain (depends on pain if takes 12 tablets).     Marland Kitchen  aluminum-magnesium hydroxide-simethicone (MAALOX) 413-244-01 MG/5ML SUSP Take 30 mLs by mouth 2 (two) times daily as needed.    . Artificial Tear Solution (SOOTHE XP OP) Apply 2 drops at bedtime to eye.    . Biotin 10000 MCG TABS Take 1 tablet by mouth daily.    . Cholecalciferol (VITAMIN D3) 5000 units CAPS Take 5,000 Units daily by mouth.     . doxepin (SINEQUAN) 10 MG capsule Take 40-60 mg at bedtime by mouth. Depends on IBS symptoms and migraines if takes 4-6    . frovatriptan (FROVA) 2.5 MG tablet Take 2.5 mg as needed by mouth for migraine (take 1 tablet at onset of migraine and repeat up to twice if needed). If recurs, may repeat after 2 hours. Max of 3 tabs in 24 hours.     Marland Kitchen loperamide (IMODIUM) 2 MG capsule Take 2-4 mg as needed by mouth for diarrhea or loose stools.    . Loperamide HCl (IMODIUM A-D PO) Take by mouth.    . loratadine (CLARITIN) 10 MG tablet Take 10 mg daily by mouth.     . losartan (COZAAR) 25 MG tablet Take 25 mg every evening by mouth.     . Multiple Vitamin (MULTIVITAMIN) tablet Take 1 tablet every other day by mouth.     . RABEprazole (ACIPHEX) 20 MG tablet Take 20 mg by mouth daily.    . simethicone (MYLICON) 80 MG chewable tablet Chew 80 mg every 6 (six) hours as needed by mouth for flatulence.    . sodium chloride (OCEAN) 0.65 % SOLN nasal spray Place 1 spray as needed into both nostrils for congestion.    . traMADol (ULTRAM) 50 MG tablet Take 1 tablet (50 mg total) by mouth every 6 (six) hours as needed for moderate pain (mild pain). (Patient not taking: Reported on 07/29/2017) 40 tablet 0   No facility-administered medications prior to visit.     Review of Systems  Constitutional: Positive for malaise/fatigue. Negative for chills, fever and weight loss.  HENT: Positive for congestion. Negative for nosebleeds, sinus pain and sore throat.   Eyes: Negative for photophobia, pain and discharge.  Respiratory: Positive for cough. Negative for hemoptysis, sputum  production, shortness of breath and wheezing.   Cardiovascular: Negative for chest pain, palpitations, orthopnea and leg swelling.  Gastrointestinal: Negative for abdominal pain, constipation, diarrhea, nausea and vomiting.  Genitourinary: Negative for dysuria, frequency, hematuria and urgency.  Musculoskeletal: Positive for myalgias. Negative for back pain, joint pain and neck pain.  Skin: Negative for itching and rash.  Neurological: Negative for tingling, tremors, sensory change, speech change, focal weakness, seizures, weakness and headaches.  Psychiatric/Behavioral: Negative for memory loss, substance abuse and suicidal ideas. The patient is not nervous/anxious.       Objective:  Physical Exam   Vitals:   07/29/17 1519  BP: 132/84  Pulse: (!) 104  SpO2: 98%  Weight: 154 lb (69.9 kg)  Height: _0  (1.753 m)   Gen: well appearing, no acute distress HENT: NCAT, OP clear, neck supple without masses Eyes: PERRL, EOMi Lymph: no cervical lymphadenopathy PULM: CTA B CV: RRR, no mgr, no JVD GI: BS+, soft, nontender, no hsm Derm: no rash or skin breakdown MSK: normal bulk and tone Neuro: A&Ox4, CN II-XII intact, strength 5/5 in all 4 extremities Psyche: normal mood and affect    CBC    Component Value Date/Time   WBC 12.0 (H)  05/30/2017 0230   RBC 4.28 05/30/2017 0230   HGB 11.8 (L) 05/30/2017 0230   HCT 37.2 05/30/2017 0230   PLT 196 05/30/2017 0230   MCV 86.9 05/30/2017 0230   MCH 27.6 05/30/2017 0230   MCHC 31.7 05/30/2017 0230   RDW 13.6 05/30/2017 0230   LYMPHSABS 1.2 01/20/2009 1530   MONOABS 0.4 01/20/2009 1530   EOSABS 0.0 01/20/2009 1530   BASOSABS 0.0 01/20/2009 1530   Arterial blood gas December 2018 7.399/39/151  Chest imaging: 04/2017 CT chest images independently reviewed showing cystic appearing changes and a bronchovascular distribution in the upper lobes, some in the lower lobes, otherwise no fibrotic change, mediastinal lymphadenopathy  noted  PFT: November 2018: Ratio 90%, FVC 3.03 L 79% predicted, total lung capacity 5.75 L 99% predicted, DLCO 21.35 68% predicted  Labs:  Path: November 2018 mediastinal and hilar lymph node: No malignancy, noncaseating granulomas seen, special stains negative for organisms  Echo:  Heart Catheterization:       Assessment & Plan:   Sarcoid - Plan: Alpha-1 antitrypsin phenotype, 6 minute walk, Pulmonary function test  Discussion: [name] has sarcoidosis.  Mediastinal lymph node histopathology showed granulomatous inflammation and a BAL performed during her procedure shows no evidence of bacteria, fungus, or AFB growth.  I see no fibrotic change.  The cystic changes in her lungs are due to the fact that she was born prematurely.  She has stage I sarcoidosis radiographically.  I explained to her that 90% or greater of these patients go on to have no other signs or symptoms of progressive disease.  Her fatigue and muscle aches could be related to sarcoidosis but I also think that fibromyalgia is much more likely.  I have encouraged her to start a vigorous exercise routine.  Plan: Sarcoidosis: You have stage I disease, this is unlikely to progress We will get another lung function test in May 2019 to monitor for disease activity We will check a 6-minute walk test when you come back in May Let me know if you have any respiratory complaint  Cystic changes of the lungs: I believe this is due to you being born prematurely Because your sister has a similar problem we will go ahead and get a blood test today to make sure there is no evidence of a genetic disease but I doubt it.  Fatigue/muscle weakness: As we stated today sarcoid could be at play, but I think the treatment would cause more harm than good I encourage you to exercise vigorously  We will see you back in May 2019  Current Outpatient Medications:  .  acetaminophen (TYLENOL) 500 MG tablet, Take 500-1,000 mg every 8 (eight)  hours as needed by mouth for mild pain (depends on pain if takes 12 tablets). , Disp: , Rfl:  .  aluminum-magnesium hydroxide-simethicone (MAALOX) 924-268-34 MG/5ML SUSP, Take 30 mLs by mouth 2 (two) times daily as needed., Disp: , Rfl:  .  Artificial Tear Solution (SOOTHE XP OP), Apply 2 drops at bedtime to eye., Disp: , Rfl:  .  Biotin 10000 MCG TABS, Take 1 tablet by mouth daily., Disp: , Rfl:  .  Cholecalciferol (VITAMIN D3) 5000 units CAPS, Take 5,000 Units daily by mouth. , Disp: , Rfl:  .  doxepin (SINEQUAN) 10 MG capsule, Take 40-60 mg at bedtime by mouth. Depends on IBS symptoms and migraines if takes 4-6, Disp: , Rfl:  .  frovatriptan (FROVA) 2.5 MG tablet, Take 2.5 mg as needed by mouth for migraine (take 1  tablet at onset of migraine and repeat up to twice if needed). If recurs, may repeat after 2 hours. Max of 3 tabs in 24 hours. , Disp: , Rfl:  .  loperamide (IMODIUM) 2 MG capsule, Take 2-4 mg as needed by mouth for diarrhea or loose stools., Disp: , Rfl:  .  Loperamide HCl (IMODIUM A-D PO), Take by mouth., Disp: , Rfl:  .  loratadine (CLARITIN) 10 MG tablet, Take 10 mg daily by mouth. , Disp: , Rfl:  .  losartan (COZAAR) 25 MG tablet, Take 25 mg every evening by mouth. , Disp: , Rfl:  .  Multiple Vitamin (MULTIVITAMIN) tablet, Take 1 tablet every other day by mouth. , Disp: , Rfl:  .  RABEprazole (ACIPHEX) 20 MG tablet, Take 20 mg by mouth daily., Disp: , Rfl:  .  simethicone (MYLICON) 80 MG chewable tablet, Chew 80 mg every 6 (six) hours as needed by mouth for flatulence., Disp: , Rfl:  .  sodium chloride (OCEAN) 0.65 % SOLN nasal spray, Place 1 spray as needed into both nostrils for congestion., Disp: , Rfl:

## 2017-08-03 LAB — ALPHA-1 ANTITRYPSIN PHENOTYPE: A-1 Antitrypsin, Ser: 128 mg/dL (ref 83–199)

## 2017-08-04 ENCOUNTER — Telehealth: Payer: Self-pay | Admitting: Pulmonary Disease

## 2017-08-04 NOTE — Telephone Encounter (Signed)
LMTCB x 1 

## 2017-08-05 ENCOUNTER — Telehealth: Payer: Self-pay | Admitting: Pulmonary Disease

## 2017-08-05 NOTE — Telephone Encounter (Signed)
ATC pt, no answer. Left message for pt to call back.  

## 2017-08-05 NOTE — Telephone Encounter (Signed)
Pt is aware of results.  Nothing further needed. 

## 2017-08-06 NOTE — Telephone Encounter (Signed)
Per Corrine's documentation, the pt was made aware of her lab results on 08/04/2017. lmtcb x2 for pt.

## 2017-08-09 NOTE — Telephone Encounter (Signed)
lmtcb x3 for pt. 

## 2017-09-22 ENCOUNTER — Ambulatory Visit (INDEPENDENT_AMBULATORY_CARE_PROVIDER_SITE_OTHER): Payer: 59 | Admitting: Pulmonary Disease

## 2017-09-22 ENCOUNTER — Encounter: Payer: Self-pay | Admitting: Pulmonary Disease

## 2017-09-22 VITALS — BP 134/76 | HR 87 | Ht 69.0 in | Wt 154.0 lb

## 2017-09-22 DIAGNOSIS — D869 Sarcoidosis, unspecified: Secondary | ICD-10-CM

## 2017-09-22 DIAGNOSIS — J301 Allergic rhinitis due to pollen: Secondary | ICD-10-CM | POA: Diagnosis not present

## 2017-09-22 DIAGNOSIS — J984 Other disorders of lung: Secondary | ICD-10-CM

## 2017-09-22 NOTE — Patient Instructions (Signed)
Lung herniation: We will send a note to Dr. Tyrone SageGerhardt so that he can see you again to evaluate this  Allergic rhinitis causing cough: Take Claritin daily Take Flonase over-the-counter daily  Sarcoidosis: We will arrange for a lung function test and six min walk test sooner than originally arranged  We will plan on seeing you back in May

## 2017-09-22 NOTE — Progress Notes (Signed)
Subjective:   PATIENT ID: Kristin [name] GENDER: female DOB: November 28, 1965, MRN: [id number]  Synopsis: Referred in January 2019 for sarcoidosis diagnosed on a lymph node biopsy in 2018. 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  HPI  Chief Complaint  Patient presents with  . Follow-up    c/o unresolving pain since having bx in November.  difficulty taking deep breath, soreness in chest.    Kristin [name] says that eer since her mediastinal lymph node biopsy she has had "twinges of pain" in her chest which occurs "every now and then".  She says the pain isn't severe.  Yesterday she had severe pain in her chest while at school.  Last night she had a severe coughing spell which she attributed to her allergies.  However she says that yesterday she notice that when she coughs she noticed that her chest will protrude over the surgical site.  This started for the first time yesterday.  She said about 2 weeks ago she had an injury where she struck a door in the same area.  Past Medical History:  Diagnosis Date  . Anxiety   . Dizziness   . Dysrhythmia    palpitations  . Fibromyalgia 04/13/2017  . GERD (gastroesophageal reflux disease)   . Headache   . History of migraine headaches   . Hypertension    hx arrhythmia  . IBS (irritable bowel syndrome)   . Lymphadenopathy   . Seasonal allergies   . Thyroid nodule   . Vertigo   . Vitamin D deficiency      Review of Systems  Constitutional: Positive for malaise/fatigue. Negative for chills, fever and weight loss.  HENT: Positive for congestion. Negative for nosebleeds, sinus pain and sore throat.   Eyes: Negative for photophobia, pain and discharge.  Respiratory: Positive for cough. Negative for hemoptysis, sputum production, shortness of breath and wheezing.   Cardiovascular: Negative for chest pain, palpitations, orthopnea and leg swelling.    Gastrointestinal: Negative for abdominal pain, constipation, diarrhea, nausea and vomiting.  Genitourinary: Negative for dysuria, frequency, hematuria and urgency.  Musculoskeletal: Positive for myalgias. Negative for back pain, joint pain and neck pain.  Skin: Negative for itching and rash.  Neurological: Negative for tingling, tremors, sensory change, speech change, focal weakness, seizures, weakness and headaches.  Psychiatric/Behavioral: Negative for memory loss, substance abuse and suicidal ideas. The patient is not nervous/anxious.       Objective:  Physical Exam   Vitals:   09/22/17 1138  BP: 134/76  Pulse: 87  SpO2: 98%  Weight: 154 lb (69.9 kg)  Height: 5\' 9"  (1.753 m)   RA  Gen: well appearing HENT: OP clear, TM's clear, neck supple CHEST: CTA B, normal percussion, lung herniation noted over Chamberlain scar left chest. CV: RRR, no mgr, trace edema GI: BS+, soft, nontender Derm: no cyanosis or rash Psyche: normal mood and affect   CBC    Component Value Date/Time   WBC 12.0 (H) 05/30/2017 0230   RBC 4.28 05/30/2017 0230   HGB 11.8 (L) 05/30/2017 0230   HCT 37.2 05/30/2017 0230   PLT 196 05/30/2017 0230   MCV 86.9 05/30/2017 0230   MCH 27.6 05/30/2017 0230   MCHC 31.7 05/30/2017 0230   RDW 13.6 05/30/2017 0230   LYMPHSABS 1.2 01/20/2009 1530   MONOABS 0.4 01/20/2009 1530   EOSABS 0.0 01/20/2009 1530   BASOSABS  0.0 01/20/2009 1530   Arterial blood gas December 2018 7.399/39/151  Chest imaging: 04/2017 CT chest images independently reviewed showing cystic appearing changes and a bronchovascular distribution in the upper lobes, some in the lower lobes, otherwise no fibrotic change, mediastinal lymphadenopathy noted  PFT: November 2018: Ratio 90%, FVC 3.03 L 79% predicted, total lung capacity 5.75 L 99% predicted, DLCO 21.35 68% predicted  Labs:  Path: November 2018 mediastinal and hilar lymph node: No malignancy, noncaseating granulomas seen,  special stains negative for organisms  Echo:  Heart Catheterization:       Assessment & Plan:   Sarcoid  Herniation of left lung  Allergic rhinitis due to pollen, unspecified seasonality  Discussion: Allergic rhinitis has made [name] cough more recently.  Yesterday she noted the development of a protrusion from her chest just to the left of her sternum.  On physical exam it appears that she does have some degree of herniation there.  I think the allergic rhinitis is contributing to the cough.  Plan: Lung herniation: We will send a note to Dr. Tyrone Sage so that he can see you again to evaluate this  Allergic rhinitis causing cough: Take Claritin daily Take Flonase over-the-counter daily  Sarcoidosis: We will arrange for a lung function test and six min walk test sooner than originally arranged  We will plan on seeing you back in May  > 50% of this 26 minute visit spent face to face   Current Outpatient Medications:  .  acetaminophen (TYLENOL) 500 MG tablet, Take 500-1,000 mg every 8 (eight) hours as needed by mouth for mild pain (depends on pain if takes 12 tablets). , Disp: , Rfl:  .  aluminum-magnesium hydroxide-simethicone (MAALOX) 200-200-20 MG/5ML SUSP, Take 30 mLs by mouth 2 (two) times daily as needed., Disp: , Rfl:  .  Artificial Tear Solution (SOOTHE XP OP), Apply 2 drops at bedtime to eye., Disp: , Rfl:  .  Biotin 16109 MCG TABS, Take 1 tablet by mouth daily., Disp: , Rfl:  .  Cholecalciferol (VITAMIN D3) 5000 units CAPS, Take 5,000 Units daily by mouth. , Disp: , Rfl:  .  doxepin (SINEQUAN) 10 MG capsule, Take 40-60 mg at bedtime by mouth. Depends on IBS symptoms and migraines if takes 4-6, Disp: , Rfl:  .  frovatriptan (FROVA) 2.5 MG tablet, Take 2.5 mg as needed by mouth for migraine (take 1 tablet at onset of migraine and repeat up to twice if needed). If recurs, may repeat after 2 hours. Max of 3 tabs in 24 hours. , Disp: , Rfl:  .  loperamide (IMODIUM) 2 MG  capsule, Take 2-4 mg as needed by mouth for diarrhea or loose stools., Disp: , Rfl:  .  loratadine (CLARITIN) 10 MG tablet, Take 10 mg daily by mouth. , Disp: , Rfl:  .  losartan (COZAAR) 25 MG tablet, Take 25 mg every evening by mouth. , Disp: , Rfl:  .  Multiple Vitamin (MULTIVITAMIN) tablet, Take 1 tablet every other day by mouth. , Disp: , Rfl:  .  RABEprazole (ACIPHEX) 20 MG tablet, Take 20 mg by mouth daily., Disp: , Rfl:  .  simethicone (MYLICON) 80 MG chewable tablet, Chew 80 mg every 6 (six) hours as needed by mouth for flatulence., Disp: , Rfl:  .  sodium chloride (OCEAN) 0.65 % SOLN nasal spray, Place 1 spray as needed into both nostrils for congestion., Disp: , Rfl:

## 2017-09-24 ENCOUNTER — Telehealth: Payer: Self-pay

## 2017-09-24 ENCOUNTER — Other Ambulatory Visit: Payer: Self-pay

## 2017-09-24 DIAGNOSIS — J984 Other disorders of lung: Secondary | ICD-10-CM

## 2017-09-24 NOTE — Telephone Encounter (Signed)
Patient contacted the office and stated that she was seen by her Pulmonologist, Dr. Kendrick FriesMcQuaid.  She stated that she has been coughing and near her incision s/p VATS, mediastinal and AP window lymph node dissection 05/28/2018 she has felt what looks like a balloon inflating in her chest.  She stated that she has been coughing a lot lately due to allergies and noticed it then.  She states this is new.  I advised her that if she experiences any shortness of breath or chest pain to call 911, otherwise we would schedule a chest xray and then evaluate.  She acknowledged receipt.

## 2017-09-27 ENCOUNTER — Telehealth: Payer: Self-pay

## 2017-09-27 NOTE — Telephone Encounter (Signed)
-----   Message from Delight OvensEdward B Gerhardt, MD sent at 09/27/2017  4:29 PM EDT ----- OK  ----- Message ----- From: Steve RattlerBurgess, Ashley F, RN Sent: 09/27/2017   9:40 AM To: Delight OvensEdward B Gerhardt, MD  I squeezed her in on April 11th at 0900.  She is getting a cxray before.  Please let me know if this is ok.  Thanks,  Morrie SheldonAshley ----- Message ----- From: Delight OvensGerhardt, Edward B, MD Sent: 09/24/2017   4:55 PM To: Steve RattlerAshley F Burgess, RN  Ok, see her next week some time with xray  ----- Message ----- From: Steve RattlerBurgess, Ashley F, RN Sent: 09/24/2017   4:51 PM To: Delight OvensEdward B Gerhardt, MD  I spoke with her, she stated that she would still like to see you even if the xray does not show anything.  Just FYI.  She is concerned bc Dr. Kendrick FriesMcQuaid told her to not lift anything above 5 lbs until seen by you.  ----- Message ----- From: Steve RattlerBurgess, Ashley F, RN Sent: 09/24/2017   2:31 PM To: Delight OvensEdward B Gerhardt, MD  She called today stated that she went to see her Pulmonologist, Dr. Kendrick FriesMcQuaid.  He stated that she has a possible herniation of her left lung.  Can you please review his last note and let me know if she needs anything prior to me making her an appointment to come in.  Thanks,  Morrie SheldonAshley

## 2017-10-07 ENCOUNTER — Other Ambulatory Visit: Payer: Self-pay

## 2017-10-07 ENCOUNTER — Ambulatory Visit (INDEPENDENT_AMBULATORY_CARE_PROVIDER_SITE_OTHER): Payer: 59 | Admitting: Cardiothoracic Surgery

## 2017-10-07 ENCOUNTER — Encounter: Payer: Self-pay | Admitting: Cardiothoracic Surgery

## 2017-10-07 ENCOUNTER — Ambulatory Visit
Admission: RE | Admit: 2017-10-07 | Discharge: 2017-10-07 | Disposition: A | Discharge status: Home / Self Care | Payer: 59 | Source: Ambulatory Visit | Attending: Cardiothoracic Surgery | Admitting: Cardiothoracic Surgery

## 2017-10-07 ENCOUNTER — Telehealth: Payer: Self-pay

## 2017-10-07 VITALS — BP 130/80 | HR 78 | Resp 18 | Ht 69.0 in | Wt 158.0 lb

## 2017-10-07 DIAGNOSIS — R590 Localized enlarged lymph nodes: Secondary | ICD-10-CM | POA: Diagnosis not present

## 2017-10-07 DIAGNOSIS — J984 Other disorders of lung: Secondary | ICD-10-CM | POA: Diagnosis not present

## 2017-10-07 NOTE — Progress Notes (Signed)
[address]       [place] [address]             [phone]      [name] Medical Record #[id number] Date of Birth: 1966/06/29  Referring: Lupita Leash, MD Primary Care: Pearson Forster, MD Primary Cardiologist: No primary care provider on file.   Chief Complaint:   POST OP FOLLOW UP 05/28/2017  OPERATIVE REPORT PREOPERATIVE DIAGNOSIS:  Mediastinal adenopathy, especially AP window nodes. 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 PROCEDURES PERFORMED:  Left parasternal exploration with video-assisted thoracoscopy and lymph node biopsy and lymph node excision, AP window nodes. SURGEON:  Sheliah Plane, MD.    History of Present Illness:     Patient comes in concerned about swelling over her left parasternal incision.  She was concerned about a pulmonary herniation.  She first noted this after coughing in January Otherwise she has been doing well return to full-time work, she is to have follow-up pulmonary function studies and pulmonary visit in May.  Diagnosis 1. Lymph node, biopsy, Mediastinal - NON-CASEATING GRANLUOMAS. - NO MALIGNANCY IDENTIFIED. 2. Lymph node, biopsy, AP window - NON-CASEATING GRANLUOMAS. - NO MALIGNANCY IDENTIFIED. Microscopic Comment 1. and 2. There are numerous well formed and confluent granluomas without necrosis. PAS, GMS, and AFB are negative for organisms. The differential includes sarcoid. Kristin Hoar MD Pathologist, Electronic Signature (Case signed 05/31/2017)    Past Medical History:  Diagnosis Date  . Anxiety   . Dizziness   . Dysrhythmia    palpitations  . Fibromyalgia 04/13/2017  . GERD (gastroesophageal reflux disease)   . Headache   . History of migraine headaches   . Hypertension    hx arrhythmia  . IBS (irritable bowel syndrome)   . Lymphadenopathy   . Seasonal allergies   . Thyroid nodule   . Vertigo    . Vitamin D deficiency      Social History   Tobacco Use  Smoking Status Never Smoker  Smokeless Tobacco Never Used    Social History   Substance and Sexual Activity  Alcohol Use No     Allergies  Allergen Reactions  . Amoxicillin Hives    Has patient had a PCN reaction causing immediate rash, facial/tongue/throat swelling, SOB or lightheadedness with hypotension: UNKNOWN  Has patient had a PCN reaction causing severe rash involving mucus membranes or skin necrosis:  #  #  #  NO  #  #  #  Has patient had a PCN reaction that required hospitalization  #  #  #  NO  #  #  #  Has patient had a PCN reaction occurring within the last 10 years: #  #  #  YES  #  #  #  If all "NO's", may proceed with Cephalosporin use.  Marland Kitchen Doxycycline Hives  . Indocin [Indomethacin] Hives  . Levaquin [Levofloxacin] Hives  . Probiotic [Acidophilus] Hives  . Azithromycin Nausea And Vomiting    UPSET STOMACH   . Bentyl [Dicyclomine Hcl] Diarrhea  . Erythromycin Nausea Only    STOMACH CRAMPS  . Keflex [Cephalexin] Rash  . Lansoprazole Other (See Comments)    HEADACHE  . Nexium [Esomeprazole] Hives and Other (See Comments)    INTOLERANCE >  HEADACHE   . Sudafed [Pseudoephedrine Hcl] Other (See Comments)    TACHYCARDIA  . Sulfamethoxazole Rash  . Tetanus Toxoids Other (See Comments)  LOCAL REACTION...REDNESS/KNOT    Current Outpatient Medications  Medication Sig Dispense Refill  . acetaminophen (TYLENOL) 500 MG tablet Take 500-1,000 mg every 8 (eight) hours as needed by mouth for mild pain (depends on pain if takes 12 tablets).     Marland Kitchen aluminum-magnesium hydroxide-simethicone (MAALOX) 200-200-20 MG/5ML SUSP Take 30 mLs by mouth 2 (two) times daily as needed.    . Artificial Tear Solution (SOOTHE XP OP) Apply 2 drops at bedtime to eye.    . Biotin 85462 MCG TABS Take 1 tablet by mouth daily.    . Cholecalciferol (VITAMIN D3) 5000 units CAPS Take 5,000 Units daily by mouth.     . doxepin  (SINEQUAN) 10 MG capsule Take 40-60 mg at bedtime by mouth. Depends on IBS symptoms and migraines if takes 4-6    . frovatriptan (FROVA) 2.5 MG tablet Take 2.5 mg as needed by mouth for migraine (take 1 tablet at onset of migraine and repeat up to twice if needed). If recurs, may repeat after 2 hours. Max of 3 tabs in 24 hours.     Marland Kitchen loperamide (IMODIUM) 2 MG capsule Take 2-4 mg as needed by mouth for diarrhea or loose stools.    Marland Kitchen loratadine (CLARITIN) 10 MG tablet Take 10 mg daily by mouth.     . losartan (COZAAR) 25 MG tablet Take 25 mg every evening by mouth.     . Multiple Vitamin (MULTIVITAMIN) tablet Take 1 tablet every other day by mouth.     . RABEprazole (ACIPHEX) 20 MG tablet Take 20 mg by mouth daily.    . simethicone (MYLICON) 80 MG chewable tablet Chew 80 mg every 6 (six) hours as needed by mouth for flatulence.    . sodium chloride (OCEAN) 0.65 % SOLN nasal spray Place 1 spray as needed into both nostrils for congestion.     No current facility-administered medications for this visit.        Physical Exam: BP 130/80 (BP Location: Left Arm, Patient Position: Sitting, Cuff Size: Normal)   Pulse 78   Resp 18   Ht 5\' 9"  (1.753 m)   Wt 158 lb (71.7 kg)   SpO2 98% Comment: RA  BMI 23.33 kg/m   General appearance: alert and cooperative Resp: clear to auscultation bilaterally Cardio: regular rate and rhythm, S1, S2 normal, no murmur, click, rub or gallop GI: soft, non-tender; bowel sounds normal; no masses,  no organomegaly Extremities: extremities normal, atraumatic, no cyanosis or edema and Homans sign is negative, no sign of DVT Neurologic: Grossly normal Wound: Patient's wound is well-healed with cough she does have slight bulge just above the incision at the intercostal space there we entered the chest, . Diagnostic Studies & Laboratory data:     Recent Radiology Findings:   No results found.  I have independently reviewed the above radiology studies  and reviewed  the findings with the patient.   Recent Lab Findings: Lab Results  Component Value Date   WBC 12.0 (H) 05/30/2017   HGB 11.8 (L) 05/30/2017   HCT 37.2 05/30/2017   PLT 196 05/30/2017   GLUCOSE 110 (H) 05/30/2017   ALT 31 05/30/2017   AST 32 05/30/2017   NA 137 05/30/2017   K 3.5 05/30/2017   CL 104 05/30/2017   CREATININE 0.72 05/30/2017   BUN 12 05/30/2017   CO2 27 05/30/2017   INR 1.04 05/27/2017      Assessment / Plan:   Mild weakness at the site of left parasternal expiration  from biopsy of mediastinal node.  I reviewed this with the patient and would not recommend any specific treatment other than holding slight pressure over this area with heavy cough or sneezing.  There is no indication for any additional surgery. She will continue follow-up with pulmonary for her underlying sarcoid I will see her back as necessary    Kristin OvensEdward B Aashika Carta MD      [address] [place] [id number] Office [phone]   Beeper [phone]  10/07/2017 9:16 AM

## 2017-10-07 NOTE — Telephone Encounter (Signed)
Left a message on patient's home phone that she could lift for her job, ie. [employer]; however, she cannot do any strenuous lifting over her head.  Patient told to call back with any other questions.

## 2017-10-18 ENCOUNTER — Ambulatory Visit (INDEPENDENT_AMBULATORY_CARE_PROVIDER_SITE_OTHER): Payer: 59 | Admitting: *Deleted

## 2017-10-18 DIAGNOSIS — D869 Sarcoidosis, unspecified: Secondary | ICD-10-CM | POA: Diagnosis not present

## 2017-10-18 NOTE — Progress Notes (Signed)
SIX MIN WALK 10/18/2017 10/18/2017  Medications Tylenol 500mg  and Imodium 2mg  were taken around 7am this morning.  -  Supplimental Oxygen during Test? (L/min) No No  Laps 8 -  Partial Lap (in Meters) 24 -  Baseline BP (sitting) 110/70 -  Baseline Heartrate 77 -  Baseline Dyspnea (Borg Scale) (No Data) -  Baseline Fatigue (Borg Scale) (No Data) -  Baseline SPO2 97 -  BP (sitting) 126/80 -  Heartrate 102 -  Dyspnea (Borg Scale) 2 -  Fatigue (Borg Scale) (No Data) -  SPO2 99 -  BP (sitting) 124/74 -  Heartrate 81 -  SPO2 97 -  Stopped or Paused before Six Minutes No -  Interpretation Hip pain -  Distance Completed 408 -  Tech Comments: Patient was able to complete 6mw without stopping. She walked at moderate pace. O2 was not needed during or after walk. Patient did report that she felt slightly SOB after the walk but this is normal for her after walking.  -

## 2017-10-22 ENCOUNTER — Ambulatory Visit (INDEPENDENT_AMBULATORY_CARE_PROVIDER_SITE_OTHER): Payer: 59 | Admitting: Pulmonary Disease

## 2017-10-22 DIAGNOSIS — D869 Sarcoidosis, unspecified: Secondary | ICD-10-CM | POA: Diagnosis not present

## 2017-10-22 LAB — PULMONARY FUNCTION TEST
DL/VA % pred: 82 %
DL/VA: 4.4 ml/min/mmHg/L
DLCO unc % pred: 71 %
DLCO unc: 22.3 ml/min/mmHg
FEF 25-75 Pre: 1.47 L/sec
FEF2575-%Pred-Pre: 48 %
FEV1-%Pred-Pre: 67 %
FEV1-PRE: 2.2 L
FEV1FVC-%Pred-Pre: 87 %
FEV6-%PRED-PRE: 77 %
FEV6-PRE: 3.13 L
FEV6FVC-%PRED-PRE: 102 %
FVC-%PRED-PRE: 75 %
FVC-PRE: 3.14 L
Pre FEV1/FVC ratio: 70 %
Pre FEV6/FVC Ratio: 100 %

## 2017-10-22 NOTE — Progress Notes (Signed)
PFT completed today.  

## 2017-10-27 ENCOUNTER — Ambulatory Visit: Payer: 59

## 2017-11-03 ENCOUNTER — Encounter: Payer: Self-pay | Admitting: Pulmonary Disease

## 2017-11-03 ENCOUNTER — Ambulatory Visit (INDEPENDENT_AMBULATORY_CARE_PROVIDER_SITE_OTHER): Payer: 59 | Admitting: Pulmonary Disease

## 2017-11-03 VITALS — BP 130/88 | HR 99 | Ht 69.0 in | Wt 160.0 lb

## 2017-11-03 DIAGNOSIS — J984 Other disorders of lung: Secondary | ICD-10-CM

## 2017-11-03 DIAGNOSIS — J449 Chronic obstructive pulmonary disease, unspecified: Secondary | ICD-10-CM

## 2017-11-03 DIAGNOSIS — D869 Sarcoidosis, unspecified: Secondary | ICD-10-CM | POA: Diagnosis not present

## 2017-11-03 MED ORDER — ALBUTEROL SULFATE HFA 108 (90 BASE) MCG/ACT IN AERS: 2.0000 | INHALATION_SPRAY | RESPIRATORY_TRACT | 3 refills | Status: DC | PRN

## 2017-11-03 NOTE — Patient Instructions (Signed)
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 We will continue to watch you closely with a lung function test in 1 year  We will see you back in 1 year or sooner if needed

## 2017-11-03 NOTE — Progress Notes (Signed)
Subjective:   PATIENT ID: Kristin [name] GENDER: female DOB: 08-25-1965, MRN: [id number]  Synopsis: Referred in January 2019 for sarcoidosis diagnosed on a lymph node biopsy in 2018. 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  HPI  Chief Complaint  Patient presents with  . Follow-up    ROV, April had PFT and - here to review results    Kristin [name] saw Dr. Tyrone Sage for the protrusion from her chest and was told to follow up in 3-4 months if it was still there or bigger.  She was told to resume regular activities.  She says taht she can feel that the area is "different" but not necessarily painful.  She hasn't needed to take any medicine for it at all.   She feels that her breathing is about the same.  If she moves too quickly she will get out of breath.  She attributes this to not exercising.    Past Medical History:  Diagnosis Date  . Anxiety   . Dizziness   . Dysrhythmia    palpitations  . Fibromyalgia 04/13/2017  . GERD (gastroesophageal reflux disease)   . Headache   . History of migraine headaches   . Hypertension    hx arrhythmia  . IBS (irritable bowel syndrome)   . Lymphadenopathy   . Seasonal allergies   . Thyroid nodule   . Vertigo   . Vitamin D deficiency      Review of Systems  Constitutional: Positive for malaise/fatigue. Negative for chills, fever and weight loss.  HENT: Positive for congestion. Negative for nosebleeds, sinus pain and sore throat.   Eyes: Negative for photophobia, pain and discharge.  Respiratory: Positive for cough. Negative for hemoptysis, sputum production, shortness of breath and wheezing.   Cardiovascular: Negative for chest pain, palpitations, orthopnea and leg swelling.  Gastrointestinal: Negative for abdominal pain, constipation, diarrhea, nausea and vomiting.  Genitourinary: Negative for hematuria.  Musculoskeletal: Positive for  myalgias. Negative for back pain, joint pain and neck pain.  Skin: Negative for itching and rash.  Neurological: Negative for tingling, tremors, sensory change, speech change, focal weakness, seizures, weakness and headaches.  Psychiatric/Behavioral: Negative for memory loss, substance abuse and suicidal ideas. The patient is not nervous/anxious.       Objective:  Physical Exam   Vitals:   11/03/17 1608  BP: 130/88  Pulse: 99  SpO2: 93%  Weight: 160 lb (72.6 kg)  Height:  (1.753 m)   RA  Gen: well appearing HENT: OP clear, TM's clear, neck supple PULM: CTA B, normal percussion CV: RRR, no mgr, trace edema GI: BS+, soft, nontender Derm: no cyanosis or rash Psyche: normal mood and affect    CBC    Component Value Date/Time   WBC 12.0 (H) 05/30/2017 0230   RBC 4.28 05/30/2017 0230   HGB 11.8 (L) 05/30/2017 0230   HCT 37.2 05/30/2017 0230   PLT 196 05/30/2017 0230   MCV 86.9 05/30/2017 0230   MCH 27.6 05/30/2017 0230   MCHC 31.7 05/30/2017 0230   RDW 13.6 05/30/2017 0230   LYMPHSABS 1.2 01/20/2009 1530   MONOABS 0.4 01/20/2009 1530   EOSABS 0.0 01/20/2009 1530   BASOSABS 0.0 01/20/2009 1530   Arterial blood gas December 2018 7.399/39/151  Chest imaging: 04/2017 CT chest images independently reviewed showing cystic appearing changes and a bronchovascular distribution in the upper lobes, some in  the lower lobes, otherwise no fibrotic change, mediastinal lymphadenopathy noted  PFT: November 2018: Ratio 90%, FVC 3.03 L 79% predicted, total lung capacity 5.75 L 99% predicted, DLCO 21.35 68% predicted April 2019 forced vital capacity 3.14 L 75% predicted, DLCO 22.371% predicted  : 09/2017 408 m (97% pred)  Labs:  Path: November 2018 mediastinal and hilar lymph node: No malignancy, noncaseating granulomas seen, special stains negative for organisms  Echo:  Heart Catheterization:       Assessment & Plan:   Sarcoid  Mild obstruction of pulmonary  airflow (HCC)  Discussion: Kristin [name] has sarcoidosis based on the mediastinal lymph node which was biopsied but fortunately there is no evidence of lung tissue involvement.  Pulmonary parenchyma looks normal, and diffusion capacity was within normal limits.  I think she does have some degree of small airways obstruction which is more likely related to the fact that she was born prematurely than the sarcoid as there is no evidence of pulmonary parenchymal disease.  She may benefit from albuterol prior to exercise.  Most importantly though she is deconditioned and she really needs to start exercising more.  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 We will continue to watch you closely with a lung function test in 1 year  We will see you back in 1 year or sooner if needed   Current Outpatient Medications:  .  acetaminophen (TYLENOL) 500 MG tablet, Take 500-1,000 mg every 8 (eight) hours as needed by mouth for mild pain (depends on pain if takes 12 tablets). , Disp: , Rfl:  .  aluminum-magnesium hydroxide-simethicone (MAALOX) 200-200-20 MG/5ML SUSP, Take 30 mLs by mouth 2 (two) times daily as needed., Disp: , Rfl:  .  Artificial Tear Solution (SOOTHE XP OP), Apply 2 drops at bedtime to eye., Disp: , Rfl:  .  Biotin [address] MCG TABS, Take 1 tablet by mouth daily., Disp: , Rfl:  .  Cholecalciferol (VITAMIN D3) 5000 units CAPS, Take 5,000 Units daily by mouth. , Disp: , Rfl:  .  doxepin (SINEQUAN) 10 MG capsule, Take 40-60 mg at bedtime by mouth. Depends on IBS symptoms and  migraines if takes 4-6, Disp: , Rfl:  .  frovatriptan (FROVA) 2.5 MG tablet, Take 2.5 mg as needed by mouth for migraine (take 1 tablet at onset of migraine and repeat up to twice if needed). If recurs, may repeat after 2 hours. Max of 3 tabs in 24 hours. , Disp: , Rfl:  .  loperamide (IMODIUM) 2 MG capsule, Take 2-4 mg as needed by mouth for diarrhea or loose stools., Disp: , Rfl:  .  loratadine (CLARITIN) 10 MG tablet, Take 10 mg daily by mouth. , Disp: , Rfl:  .  losartan (COZAAR) 25 MG tablet, Take 25 mg every evening by mouth. , Disp: , Rfl:  .  Multiple Vitamin (MULTIVITAMIN) tablet, Take 1 tablet every other day by mouth. , Disp: , Rfl:  .  RABEprazole (ACIPHEX) 20 MG tablet, Take 20 mg by mouth daily as needed. , Disp: , Rfl:  .  simethicone (MYLICON) 80  MG chewable tablet, Chew 80 mg every 6 (six) hours as needed by mouth for flatulence., Disp: , Rfl:  .  sodium chloride (OCEAN) 0.65 % SOLN nasal spray, Place 1 spray as needed into both nostrils for congestion., Disp: , Rfl:

## 2017-11-05 ENCOUNTER — Ambulatory Visit (INDEPENDENT_AMBULATORY_CARE_PROVIDER_SITE_OTHER): Payer: 59 | Admitting: Ophthalmology

## 2017-11-05 DIAGNOSIS — H2513 Age-related nuclear cataract, bilateral: Secondary | ICD-10-CM

## 2017-11-05 DIAGNOSIS — H43813 Vitreous degeneration, bilateral: Secondary | ICD-10-CM

## 2017-11-05 DIAGNOSIS — H35033 Hypertensive retinopathy, bilateral: Secondary | ICD-10-CM

## 2017-11-05 DIAGNOSIS — H33303 Unspecified retinal break, bilateral: Secondary | ICD-10-CM

## 2017-11-05 DIAGNOSIS — I10 Essential (primary) hypertension: Secondary | ICD-10-CM | POA: Diagnosis not present

## 2017-12-01 ENCOUNTER — Telehealth: Payer: Self-pay

## 2017-12-01 NOTE — Telephone Encounter (Signed)
Ms. Dario GuardianCatron called regarding a "bulge" in the chest muscle that happens when he sneezes or coughs.  She stated that it has not had improvement and it has gotten larger.  She is s/p VATS in 04/2017.  After speaking with Dr. Tyrone SageGerhardt, she is welcome to come back to the office to speak with him.  Voicemail was left for a return call.

## 2017-12-22 ENCOUNTER — Other Ambulatory Visit: Payer: Self-pay | Admitting: Obstetrics and Gynecology

## 2017-12-22 ENCOUNTER — Telehealth: Payer: Self-pay | Admitting: Pulmonary Disease

## 2017-12-22 DIAGNOSIS — Z1231 Encounter for screening mammogram for malignant neoplasm of breast: Secondary | ICD-10-CM

## 2017-12-22 NOTE — Telephone Encounter (Signed)
Called and spoke to pt.  Pt stated during her visit today with PCP, it was mentioned to discuss PNA vaccine with BQ, due to pt's dx of sarcoid.  Pt is aware that BQ is out of office until 12/27/17- pt is okay with waiting.   BQ please advise. Thanks

## 2017-12-27 NOTE — Telephone Encounter (Signed)
Left message for patient to call back for BQ's recommendations.

## 2017-12-27 NOTE — Telephone Encounter (Signed)
Good idea to be vaccinated. Would start with prevnar then pneumovax after that

## 2017-12-28 NOTE — Telephone Encounter (Signed)
Attempted to call pt but unable to reach pt. Left message for pt to return call x2. 

## 2017-12-29 ENCOUNTER — Telehealth: Payer: Self-pay | Admitting: Pulmonary Disease

## 2017-12-29 NOTE — Telephone Encounter (Signed)
lmtcb for pt.  

## 2017-12-29 NOTE — Telephone Encounter (Signed)
error 

## 2017-12-29 NOTE — Telephone Encounter (Signed)
LMOMTCB x 1 

## 2017-12-29 NOTE — Telephone Encounter (Signed)
Patient returned phone call. °

## 2017-12-31 NOTE — Telephone Encounter (Signed)
Spoke with patient. She is aware of BQ's recommendations. She will contact her PCP to get these injections since she is not due for a f/u until May 2020.   Nothing else needed at time of call.

## 2018-01-20 ENCOUNTER — Ambulatory Visit: Payer: 59 | Admitting: Cardiothoracic Surgery

## 2018-01-21 ENCOUNTER — Ambulatory Visit (INDEPENDENT_AMBULATORY_CARE_PROVIDER_SITE_OTHER): Payer: 59 | Admitting: Cardiothoracic Surgery

## 2018-01-21 VITALS — BP 140/90 | HR 82 | Resp 20 | Ht 69.0 in | Wt 160.0 lb

## 2018-01-21 DIAGNOSIS — D869 Sarcoidosis, unspecified: Secondary | ICD-10-CM | POA: Diagnosis not present

## 2018-01-21 DIAGNOSIS — J984 Other disorders of lung: Secondary | ICD-10-CM | POA: Diagnosis not present

## 2018-01-21 NOTE — Progress Notes (Signed)
[address]       [place] [id number]             [phone]      Jonna Munromie H Pancake [facility] Medical Record #[id number] Date of Birth: 05/19/1966  Referring: Lupita LeashMcQuaid, Douglas B, MD Primary Care: Pearson ForsterKelly, [name] S, MD Primary Cardiologist: No primary care provider on file.   Chief Complaint:   POST OP FOLLOW UP 05/28/2017  OPERATIVE REPORT PREOPERATIVE DIAGNOSIS:  Mediastinal adenopathy, especially AP window nodes. 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 PROCEDURES PERFORMED:  Left parasternal exploration with video-assisted thoracoscopy and lymph node biopsy and lymph node excision, AP window nodes. SURGEON:  Sheliah PlaneEdward Tysheka Fanguy, MD.    History of Present Illness:      November 2018 the patient had left parasternal exploration with biopsy of lymph nodes initially thought to be lymphoma but final pathologic diagnosis was that of sarcoid.  The patient comes in today concerned about puffiness over the incision when she coughs.  She is sure that the lung is going to herniate through the incision. Diagnosis 1. Lymph node, biopsy, Mediastinal - NON-CASEATING GRANLUOMAS. - NO MALIGNANCY IDENTIFIED. 2. Lymph node, biopsy, AP window - NON-CASEATING GRANLUOMAS. - NO MALIGNANCY IDENTIFIED. Microscopic Comment 1. and 2. There are numerous well formed and confluent granluomas without necrosis. PAS, GMS, and AFB are negative for organisms. The differential includes sarcoid. Valinda HoarJULIA MANNY MD Pathologist, Electronic Signature (Case signed 05/31/2017)    Past Medical History:  Diagnosis Date  . Anxiety   . Dizziness   . Dysrhythmia    palpitations  . Fibromyalgia 04/13/2017  . GERD (gastroesophageal reflux disease)   . Headache   . History of migraine headaches   . Hypertension    hx arrhythmia  . IBS (irritable bowel syndrome)   . Lymphadenopathy   . Seasonal allergies   . Thyroid nodule    . Vertigo   . Vitamin D deficiency      Social History   Tobacco Use  Smoking Status Never Smoker  Smokeless Tobacco Never Used    Social History   Substance and Sexual Activity  Alcohol Use No     Allergies  Allergen Reactions  . Amoxicillin Hives    Has patient had a PCN reaction causing immediate rash, facial/tongue/throat swelling, SOB or lightheadedness with hypotension: UNKNOWN  Has patient had a PCN reaction causing severe rash involving mucus membranes or skin necrosis:  #  #  #  NO  #  #  #  Has patient had a PCN reaction that required hospitalization  #  #  #  NO  #  #  #  Has patient had a PCN reaction occurring within the last 10 years: #  #  #  YES  #  #  #  If all "NO's", may proceed with Cephalosporin use.  Marland Kitchen. Doxycycline Hives  . Indocin [Indomethacin] Hives  . Levaquin [Levofloxacin] Hives  . Probiotic [Acidophilus] Hives  . Azithromycin Nausea And Vomiting    UPSET STOMACH   . Bentyl [Dicyclomine Hcl] Diarrhea  . Erythromycin Nausea Only    STOMACH CRAMPS  . Keflex [Cephalexin] Rash  . Lansoprazole Other (See Comments)    HEADACHE  . Nexium [Esomeprazole] Hives and Other (See Comments)    INTOLERANCE >  HEADACHE   . Sudafed [Pseudoephedrine Hcl] Other (See Comments)    TACHYCARDIA  . Sulfamethoxazole Rash  . Tetanus Toxoids Other (  See Comments)    LOCAL REACTION...REDNESS/KNOT    Current Outpatient Medications  Medication Sig Dispense Refill  . acetaminophen (TYLENOL) 500 MG tablet Take 500-1,000 mg every 8 (eight) hours as needed by mouth for mild pain (depends on pain if takes 12 tablets).     Marland Kitchen albuterol (PROAIR HFA) 108 (90 Base) MCG/ACT inhaler Inhale 2 puffs into the lungs every 4 (four) hours as needed for wheezing or shortness of breath. 1 Inhaler 3  . aluminum-magnesium hydroxide-simethicone (MAALOX) 200-200-20 MG/5ML SUSP Take 30 mLs by mouth 2 (two) times daily as needed.    . Artificial Tear Solution (SOOTHE XP OP) Apply 2 drops at  bedtime to eye.    . Biotin [address] MCG TABS Take 1 tablet by mouth daily.    . Cholecalciferol (VITAMIN D3) 5000 units CAPS Take 5,000 Units daily by mouth.     . doxepin (SINEQUAN) 10 MG capsule Take 40-60 mg at bedtime by mouth. Depends on IBS symptoms and migraines if takes 4-6    . frovatriptan (FROVA) 2.5 MG tablet Take 2.5 mg as needed by mouth for migraine (take 1 tablet at onset of migraine and repeat up to twice if needed). If recurs, may repeat after 2 hours. Max of 3 tabs in 24 hours.     Marland Kitchen loperamide (IMODIUM) 2 MG capsule Take 2-4 mg as needed by mouth for diarrhea or loose stools.    Marland Kitchen loratadine (CLARITIN) 10 MG tablet Take 10 mg daily by mouth.     . losartan (COZAAR) 25 MG tablet Take 25 mg every evening by mouth.     . Multiple Vitamin (MULTIVITAMIN) tablet Take 1 tablet every other day by mouth.     . RABEprazole (ACIPHEX) 20 MG tablet Take 20 mg by mouth daily as needed.     . simethicone (MYLICON) 80 MG chewable tablet Chew 80 mg every 6 (six) hours as needed by mouth for flatulence.    . sodium chloride (OCEAN) 0.65 % SOLN nasal spray Place 1 spray as needed into both nostrils for congestion.     No current facility-administered medications for this visit.        Physical Exam: BP 140/90   Pulse 82   Resp 20   Ht 5\' 9"  (1.753 m)   Wt 160 lb (72.6 kg)   SpO2 97% Comment: RA  BMI 23.63 kg/m  General appearance: alert and cooperative Head: Normocephalic, without obvious abnormality, atraumatic Neck: no adenopathy, no carotid bruit, no JVD, supple, symmetrical, trachea midline and thyroid not enlarged, symmetric, no tenderness/mass/nodules Lymph nodes: Cervical, supraclavicular, and axillary nodes normal. Resp: clear to auscultation bilaterally Cardio: regular rate and rhythm, S1, S2 normal, no murmur, click, rub or gallop GI: soft, non-tender; bowel sounds normal; no masses,  no organomegaly Extremities: extremities normal, atraumatic, no cyanosis or edema and  Homans sign is negative, no sign of DVT Neurologic: Grossly normal Wound: Patient's wound is well-healed with cough she does have slight bulge just above the incision this does not appear any more pronounced than on previous exams   diagnostic Studies & Laboratory data:     Recent Radiology Findings:        Recent Lab Findings: Lab Results  Component Value Date   WBC 12.0 (H) 05/30/2017   HGB 11.8 (L) 05/30/2017   HCT 37.2 05/30/2017   PLT 196 05/30/2017   GLUCOSE 110 (H) 05/30/2017   ALT 31 05/30/2017   AST 32 05/30/2017   NA 137 05/30/2017  K 3.5 05/30/2017   CL 104 05/30/2017   CREATININE 0.72 05/30/2017   BUN 12 05/30/2017   CO2 27 05/30/2017   INR 1.04 05/27/2017      Assessment / Plan:   Mild weakness at the site of left parasternal expiration from biopsy of mediastinal node.  I reviewed this with the patient and would not recommend any specific treatment  There is no indication for any additional surgery. She will continue follow-up with pulmonary for her underlying sarcoid I will see her back as necessary    Delight Ovens MD      [address] [place] [place] [address] Office [phone]   Beeper [phone]  01/21/2018 11:31 AM

## 2018-01-24 ENCOUNTER — Ambulatory Visit
Admission: RE | Admit: 2018-01-24 | Discharge: 2018-01-24 | Disposition: A | Discharge status: Home / Self Care | Payer: 59 | Source: Ambulatory Visit | Attending: Obstetrics and Gynecology | Admitting: Obstetrics and Gynecology

## 2018-01-24 DIAGNOSIS — Z1231 Encounter for screening mammogram for malignant neoplasm of breast: Secondary | ICD-10-CM

## 2018-03-10 ENCOUNTER — Telehealth: Payer: Self-pay | Admitting: Pulmonary Disease

## 2018-03-10 NOTE — Telephone Encounter (Signed)
LMTCB

## 2018-03-10 NOTE — Telephone Encounter (Signed)
Pt is calling back [phone]

## 2018-03-10 NOTE — Telephone Encounter (Signed)
Spoke with patient. She was calling back in regards to getting the Prevnar vaccine. She had a discussion with her PCP and her PCP advised her to ask again since the guidelines for the PNA vaccines are changing.   BQ, please advise if you are aware of any guidelines changes.

## 2018-03-11 NOTE — Telephone Encounter (Signed)
Spoke with the pt and notified of recs per BQ  She states that she has actually never had any type of PNA vaccine done  Do you want her to come in and have this done? She is not due to see you until May 2020  Thanks

## 2018-03-11 NOTE — Telephone Encounter (Signed)
If she can show me the records of when she had both vaccines I can help her figure this out.  Please have her bring in those records.

## 2018-03-16 NOTE — Telephone Encounter (Signed)
LMTCB. Needs to be put on injection schedule for Prevnar Injection.

## 2018-03-16 NOTE — Telephone Encounter (Signed)
She can have it here, or with her PCP or with a pharmacist.  I recommend prevnar first.

## 2018-03-16 NOTE — Telephone Encounter (Signed)
Nothing further needed at this time. 

## 2018-03-16 NOTE — Telephone Encounter (Signed)
Patient returned call.  I scheduled her on injection sch for Prevnar 03/25/2018.

## 2018-03-25 ENCOUNTER — Ambulatory Visit (INDEPENDENT_AMBULATORY_CARE_PROVIDER_SITE_OTHER): Payer: 59

## 2018-03-25 DIAGNOSIS — Z23 Encounter for immunization: Secondary | ICD-10-CM | POA: Diagnosis not present

## 2018-04-14 ENCOUNTER — Encounter: Payer: Self-pay | Admitting: Pulmonary Disease

## 2018-04-14 ENCOUNTER — Ambulatory Visit (INDEPENDENT_AMBULATORY_CARE_PROVIDER_SITE_OTHER)
Admission: RE | Admit: 2018-04-14 | Discharge: 2018-04-14 | Disposition: A | Discharge status: Home / Self Care | Payer: 59 | Source: Ambulatory Visit | Attending: Pulmonary Disease | Admitting: Pulmonary Disease

## 2018-04-14 ENCOUNTER — Ambulatory Visit: Payer: 59 | Admitting: Pulmonary Disease

## 2018-04-14 VITALS — BP 118/70 | HR 99 | Ht 69.0 in | Wt 157.4 lb

## 2018-04-14 DIAGNOSIS — J449 Chronic obstructive pulmonary disease, unspecified: Secondary | ICD-10-CM | POA: Diagnosis not present

## 2018-04-14 DIAGNOSIS — J0100 Acute maxillary sinusitis, unspecified: Secondary | ICD-10-CM

## 2018-04-14 DIAGNOSIS — J219 Acute bronchiolitis, unspecified: Secondary | ICD-10-CM

## 2018-04-14 DIAGNOSIS — J984 Other disorders of lung: Secondary | ICD-10-CM | POA: Diagnosis not present

## 2018-04-14 DIAGNOSIS — R05 Cough: Secondary | ICD-10-CM

## 2018-04-14 DIAGNOSIS — R059 Cough, unspecified: Secondary | ICD-10-CM

## 2018-04-14 MED ORDER — BENZONATATE 100 MG PO CAPS: 100.0000 mg | ORAL_CAPSULE | Freq: Three times a day (TID) | ORAL | 1 refills | Status: DC | PRN

## 2018-04-14 MED ORDER — AZITHROMYCIN 500 MG PO TABS: 500.0000 mg | ORAL_TABLET | Freq: Every day | ORAL | 0 refills | Status: DC

## 2018-04-14 NOTE — Patient Instructions (Signed)
Chest wall laxity/herniation with cough: Follow-up with Dr. Tyrone Sage  Acute maxillary bacterial sinusitis and acute bronchitis: Chest x-ray today to make sure there is no evidence of pneumonia Tessalon as needed for cough Take azithromycin as prescribed If your symptoms worsen let us know  Sarcoidosis: Follow-up with me as previously arranged

## 2018-04-14 NOTE — Progress Notes (Signed)
Subjective:   PATIENT ID: Kristin [name] GENDER: female DOB: [birth year], MRN: [id number]  Synopsis: Referred in January 2019 for sarcoidosis diagnosed on a lymph node biopsy in 2018. 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  HPI  Chief Complaint  Patient presents with  . Acute Visit    For 3 weeks now she has felt like she has laryngitis, fever of 99.0. She has been coughing up yellow to green mucus   [name] comes to clinic today because she says she has been sick for 3 weeks.  She says that she started with a case of laryngitis with a sore throat, cough with mucus production.  She got a flu shot about 2 weeks into this illness and she developed body aches and chills, no fever.  She says that she has had some shortness of breath.  She coughs all day.  She now has facial pressure behind her sinuses and she says that she is coughing up green mucus.  She also is concerned about the ongoing feeling of pressure and protrusion of skin in her chest when she coughs.  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  Past Medical History:  Diagnosis Date  . Anxiety   . Dizziness   . Dysrhythmia    palpitations  . Fibromyalgia 04/13/2017  . GERD (gastroesophageal reflux disease)   . Headache   . History of migraine headaches   . Hypertension    hx arrhythmia  . IBS (irritable bowel syndrome)   . Lymphadenopathy   . Seasonal allergies   . Thyroid nodule   . Vertigo   . Vitamin D deficiency      Review of Systems  Constitutional: Positive for malaise/fatigue. Negative for chills, fever and weight loss.  HENT: Positive for congestion. Negative for nosebleeds, sinus pain and sore throat.   Eyes: Negative for photophobia, pain and discharge.  Respiratory: Positive for cough. Negative for  hemoptysis, sputum production, shortness of breath and wheezing.   Cardiovascular: Negative for chest pain, palpitations, orthopnea and leg swelling.  Gastrointestinal: Negative for abdominal pain, constipation, diarrhea, nausea and vomiting.  Genitourinary: Negative for hematuria.  Musculoskeletal: Positive for myalgias. Negative for back pain, joint pain and neck pain.  Skin: Negative for itching and rash.  Neurological: Negative for tingling, tremors, sensory change, speech change, focal weakness, seizures, weakness and headaches.  Psychiatric/Behavioral: Negative for memory loss, substance abuse and suicidal ideas. The patient is not nervous/anxious.       Objective:  Physical Exam   Vitals:   04/14/18 0928  BP: 118/70  Pulse: 99  SpO2: 97%  Weight: 157 lb 6.4 oz (71.4 kg)  Height: 5\' 9"  (1.753 m)   RA  Gen: well appearing HENT: OP clear, TM's clear, neck supple PULM: CTA B, normal percussion CV: RRR, no mgr, trace edema GI: BS+, soft, nontender Derm: no cyanosis or rash Psyche: normal mood and affect     CBC    Component Value Date/Time   WBC 12.0 (H) 05/30/2017 0230   RBC 4.28 05/30/2017 0230   HGB 11.8 (L) 05/30/2017 0230   HCT 37.2 05/30/2017 0230   PLT 196 05/30/2017 0230   MCV 86.9 05/30/2017 0230   MCH 27.6 05/30/2017 0230   MCHC 31.7 05/30/2017  0230   RDW 13.6 05/30/2017 0230   LYMPHSABS 1.2 01/20/2009 1530   MONOABS 0.4 01/20/2009 1530   EOSABS 0.0 01/20/2009 1530   BASOSABS 0.0 01/20/2009 1530   Arterial blood gas December 2018 7.399/39/151  Chest imaging: 04/2017 CT chest images independently reviewed showing cystic appearing changes and a bronchovascular distribution in the upper lobes, some in the lower lobes, otherwise no fibrotic change, mediastinal lymphadenopathy noted  PFT: November 2018: Ratio 90%, FVC 3.03 L 79% predicted, total lung capacity 5.75 L 99% predicted, DLCO 21.35 68% predicted April 2019 forced vital capacity 3.14 L 75%  predicted, DLCO 22.371% predicted  : 09/2017 408 m (97% pred)  Labs:  Path: November 2018 mediastinal and hilar lymph node: No malignancy, noncaseating granulomas seen, special stains negative for organisms  Echo:  Heart Catheterization:       Assessment & Plan:   Cough - Plan: DG Chest 2 View  Mild obstruction of pulmonary airflow (HCC)  Herniation of left lung  Acute bronchiolitis due to unspecified organism  Acute non-recurrent maxillary sinusitis  Discussion: [name] had an upper respiratory infection which is now led to acute bacterial maxillary sinusitis and acute bronchitis.  She needs an antibiotic to treat this.  We will get a chest x-ray to make sure there is not something more serious going on.  Her lungs are clear to auscultation today and she has normal vital signs so I do not strongly suspect worsening sarcoidosis or pneumonia.  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  She remains concerned about this chest wall herniation.  I advised her today that she needs to stop lifting heavy children as advised by Dr. Jaynie Collins.  Plan: Chest wall laxity/herniation with cough: Follow-up with Dr. Tyrone Sage  Acute maxillary bacterial sinusitis and acute bronchitis: Chest x-ray today to make sure there is no evidence of pneumonia Tessalon as needed for cough Take azithromycin as prescribed If your symptoms worsen let us know  Sarcoidosis: Follow-up with me as previously arranged  Greater than 50% of this 28-minute visit spent face-to-face   Current Outpatient Medications:  .  acetaminophen (TYLENOL) 500 MG tablet, Take 500-1,000 mg every 8 (eight) hours as needed by mouth for mild pain (depends on pain if takes 12 tablets). , Disp: , Rfl:  .  albuterol (PROAIR HFA) 108 (90 Base) MCG/ACT inhaler, Inhale 2 puffs into the lungs every 4 (four) hours as needed for wheezing or shortness of breath.,  Disp: 1 Inhaler, Rfl: 3 .  aluminum-magnesium hydroxide-simethicone (MAALOX) 200-200-20 MG/5ML SUSP, Take 30 mLs by mouth 2 (two) times daily as needed., Disp: , Rfl:  .  Artificial Tear Solution (SOOTHE XP OP), Apply 2 drops at bedtime to eye., Disp: , Rfl:  .  Biotin [address]9811 MCG TABS, Take 1 tablet by mouth daily., Disp: , Rfl:  .  Cholecalciferol (VITAMIN D3) 5000 units CAPS, Take 5,000 Units daily by mouth. , Disp: , Rfl:  .  doxepin (SINEQUAN) 10 MG capsule, Take 40-60 mg at bedtime by mouth. Depends on IBS symptoms and migraines if takes 4-6, Disp: , Rfl:  .  frovatriptan (FROVA) 2.5 MG tablet, Take 2.5 mg as needed by mouth for migraine (take 1 tablet at onset of migraine and repeat up to twice if needed). If recurs, may repeat after 2 hours. Max of 3 tabs in 24 hours. , Disp: , Rfl:  .  loperamide (IMODIUM) 2 MG capsule, Take 2-4 mg  as needed by mouth for diarrhea or loose stools., Disp: , Rfl:  .  loratadine (CLARITIN) 10 MG tablet, Take 10 mg daily by mouth. , Disp: , Rfl:  .  losartan (COZAAR) 25 MG tablet, Take 25 mg every evening by mouth. , Disp: , Rfl:  .  Multiple Vitamin (MULTIVITAMIN) tablet, Take 1 tablet every other day by mouth. , Disp: , Rfl:  .  RABEprazole (ACIPHEX) 20 MG tablet, Take 20 mg by mouth daily as needed. , Disp: , Rfl:  .  simethicone (MYLICON) 80 MG chewable tablet, Chew 80 mg every 6 (six) hours as needed by mouth for flatulence., Disp: , Rfl:  .  sodium chloride (OCEAN) 0.65 % SOLN nasal spray, Place 1 spray as needed into both nostrils for congestion., Disp: , Rfl:  .  azithromycin (ZITHROMAX) 500 MG tablet, Take 1 tablet (500 mg total) by mouth daily., Disp: 3 tablet, Rfl: 0 .  benzonatate (TESSALON) 100 MG capsule, Take 1 capsule (100 mg total) by mouth every 8 (eight) hours as needed for cough., Disp: 45 capsule, Rfl: 1

## 2018-04-18 ENCOUNTER — Other Ambulatory Visit: Payer: Self-pay | Admitting: Cardiothoracic Surgery

## 2018-04-18 ENCOUNTER — Other Ambulatory Visit: Payer: Self-pay | Admitting: *Deleted

## 2018-04-18 DIAGNOSIS — J984 Other disorders of lung: Secondary | ICD-10-CM

## 2018-04-22 ENCOUNTER — Telehealth: Payer: Self-pay | Admitting: Pulmonary Disease

## 2018-04-22 NOTE — Telephone Encounter (Signed)
Per Tammy P,NP   CXR on 10/17 was ok  Would hold on additonal abx at this time  Recommend Mucinex DM Twice daily  As needed  Cough/congestion  claritin daily As needed  Drainage .  Tessalon Three times a day  As needed  Cough  Saline nasal rinses As needed    If not improving will need ov for further eval.  Please contact office for sooner follow up if symptoms do not improve or worsen or seek emergency care      Called and spoke with Patient. Recommendations given.  Patient read back recommendations and stated understanding.  Nothing further at this time.

## 2018-04-22 NOTE — Telephone Encounter (Signed)
Called and spoke with Patient.  She stated that she had been seen by Dr. Kendrick Fries 04/14/18 and was given zithromycin for 3 days.  She finished the zithromycin and felt better, went back to work on Wednesday.  She stated that yesterday her throat was feeling sore,scratchy, and tight again.  She started having a productive cough with yellow/green mucus today.    Will route to Dr. Kendrick Fries to advise

## 2018-04-22 NOTE — Telephone Encounter (Signed)
CXR on 10/17 was ok  Would hold on additonal abx at this time  Recommend Mucinex DM Twice daily  As needed  Cough/congestion  claritin daily As needed  Drainage .  Tessalon Three times a day  As needed  Cough  Saline nasal rinses As needed    If not improving will need ov for further eval.  Please contact office for sooner follow up if symptoms do not improve or worsen or seek emergency care

## 2018-05-05 ENCOUNTER — Encounter: Payer: Self-pay | Admitting: Cardiothoracic Surgery

## 2018-05-05 ENCOUNTER — Ambulatory Visit
Admission: RE | Admit: 2018-05-05 | Discharge: 2018-05-05 | Disposition: A | Discharge status: Home / Self Care | Payer: 59 | Source: Ambulatory Visit | Attending: Cardiothoracic Surgery | Admitting: Cardiothoracic Surgery

## 2018-05-05 ENCOUNTER — Ambulatory Visit: Payer: 59 | Admitting: Cardiothoracic Surgery

## 2018-05-05 ENCOUNTER — Other Ambulatory Visit: Payer: Self-pay

## 2018-05-05 ENCOUNTER — Other Ambulatory Visit: Payer: 59

## 2018-05-05 VITALS — BP 138/84 | HR 100 | Resp 18 | Ht 69.0 in | Wt 159.2 lb

## 2018-05-05 DIAGNOSIS — R590 Localized enlarged lymph nodes: Secondary | ICD-10-CM | POA: Diagnosis not present

## 2018-05-05 DIAGNOSIS — Z5189 Encounter for other specified aftercare: Secondary | ICD-10-CM

## 2018-05-05 DIAGNOSIS — R220 Localized swelling, mass and lump, head: Secondary | ICD-10-CM | POA: Diagnosis not present

## 2018-05-05 DIAGNOSIS — D869 Sarcoidosis, unspecified: Secondary | ICD-10-CM | POA: Diagnosis not present

## 2018-05-05 DIAGNOSIS — J984 Other disorders of lung: Secondary | ICD-10-CM

## 2018-05-05 NOTE — Progress Notes (Signed)
[address]       [place] [address]             [id number]      SHATAYA WINKLES [facility] Medical Record #[id number] Date of Birth: [birth year]  Referring: Lupita Leash, MD Primary Care: Pearson Forster, MD Primary Cardiologist: No primary care provider on file.   Chief Complaint:   POST OP FOLLOW UP 05/28/2017  OPERATIVE REPORT PREOPERATIVE DIAGNOSIS:  Mediastinal adenopathy, especially AP window nodes. 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 PROCEDURES PERFORMED:  Left parasternal exploration with video-assisted thoracoscopy and lymph node biopsy and lymph node excision, AP window nodes. SURGEON:  Sheliah Plane, MD.    History of Present Illness:      November 2018 the patient had left parasternal exploration with biopsy of lymph nodes initially thought to be lymphoma but final pathologic diagnosis was that of sarcoid.  The patient returns today still concerned aboutpuffiness over the incision when she coughs.  She feels like this area is slightly enlarged with the lifting she does not work.  CT scan of the chest was done to evaluate for lung herniation  Diagnosis 1. Lymph node, biopsy, Mediastinal - NON-CASEATING GRANLUOMAS. - NO MALIGNANCY IDENTIFIED. 2. Lymph node, biopsy, AP window - NON-CASEATING GRANLUOMAS. - NO MALIGNANCY IDENTIFIED. Microscopic Comment 1. and 2. There are numerous well formed and confluent granluomas without necrosis. PAS, GMS, and AFB are negative for organisms. The differential includes sarcoid. Valinda Hoar MD Pathologist, Electronic Signature (Case signed 05/31/2017)    Past Medical History:  Diagnosis Date  . Anxiety   . Dizziness   . Dysrhythmia    palpitations  . Fibromyalgia 04/13/2017  . GERD (gastroesophageal reflux disease)   . Headache   . History of migraine headaches   . Hypertension    hx arrhythmia  . IBS  (irritable bowel syndrome)   . Lymphadenopathy   . Seasonal allergies   . Thyroid nodule   . Vertigo   . Vitamin D deficiency      Social History   Tobacco Use  Smoking Status Never Smoker  Smokeless Tobacco Never Used    Social History   Substance and Sexual Activity  Alcohol Use No     Allergies  Allergen Reactions  . Amoxicillin Hives    Has patient had a PCN reaction causing immediate rash, facial/tongue/throat swelling, SOB or lightheadedness with hypotension: UNKNOWN  Has patient had a PCN reaction causing severe rash involving mucus membranes or skin necrosis:  #  #  #  NO  #  #  #  Has patient had a PCN reaction that required hospitalization  #  #  #  NO  #  #  #  Has patient had a PCN reaction occurring within the last 10 years: #  #  #  YES  #  #  #  If all "NO's", may proceed with Cephalosporin use.  Marland Kitchen Doxycycline Hives  . Indocin [Indomethacin] Hives  . Levaquin [Levofloxacin] Hives  . Probiotic [Acidophilus] Hives  . Bentyl [Dicyclomine Hcl] Diarrhea  . Erythromycin Nausea Only    STOMACH CRAMPS  . Keflex [Cephalexin] Rash  . Lansoprazole Other (See Comments)    HEADACHE  . Nexium [Esomeprazole] Hives and Other (See Comments)    INTOLERANCE >  HEADACHE   . Sudafed [Pseudoephedrine Hcl] Other (See Comments)    TACHYCARDIA  . Sulfamethoxazole Rash  .  Tetanus Toxoids Other (See Comments)    LOCAL REACTION...REDNESS/KNOT    Current Outpatient Medications  Medication Sig Dispense Refill  . acetaminophen (TYLENOL) 500 MG tablet Take 500-1,000 mg every 8 (eight) hours as needed by mouth for mild pain (depends on pain if takes 12 tablets).     Marland Kitchen albuterol (PROAIR HFA) 108 (90 Base) MCG/ACT inhaler Inhale 2 puffs into the lungs every 4 (four) hours as needed for wheezing or shortness of breath. 1 Inhaler 3  . aluminum-magnesium hydroxide-simethicone (MAALOX) 200-200-20 MG/5ML SUSP Take 30 mLs by mouth 2 (two) times daily as needed.    . Artificial Tear  Solution (SOOTHE XP OP) Apply 2 drops at bedtime to eye.    Marland Kitchen azithromycin (ZITHROMAX) 500 MG tablet Take 1 tablet (500 mg total) by mouth daily. 3 tablet 0  . benzonatate (TESSALON) 100 MG capsule Take 1 capsule (100 mg total) by mouth every 8 (eight) hours as needed for cough. 45 capsule 1  . Biotin 57846 MCG TABS Take 1 tablet by mouth daily.    . Cholecalciferol (VITAMIN D3) 5000 units CAPS Take 5,000 Units daily by mouth.     . doxepin (SINEQUAN) 10 MG capsule Take 40-60 mg at bedtime by mouth. Depends on IBS symptoms and migraines if takes 4-6    . frovatriptan (FROVA) 2.5 MG tablet Take 2.5 mg as needed by mouth for migraine (take 1 tablet at onset of migraine and repeat up to twice if needed). If recurs, may repeat after 2 hours. Max of 3 tabs in 24 hours.     Marland Kitchen loperamide (IMODIUM) 2 MG capsule Take 2-4 mg as needed by mouth for diarrhea or loose stools.    Marland Kitchen loratadine (CLARITIN) 10 MG tablet Take 10 mg daily by mouth.     . losartan (COZAAR) 25 MG tablet Take 25 mg every evening by mouth.     . Multiple Vitamin (MULTIVITAMIN) tablet Take 1 tablet every other day by mouth.     . RABEprazole (ACIPHEX) 20 MG tablet Take 20 mg by mouth daily as needed.     . simethicone (MYLICON) 80 MG chewable tablet Chew 80 mg every 6 (six) hours as needed by mouth for flatulence.    . sodium chloride (OCEAN) 0.65 % SOLN nasal spray Place 1 spray as needed into both nostrils for congestion.     No current facility-administered medications for this visit.        Physical Exam: BP 138/84 (BP Location: Left Arm, Patient Position: Sitting, Cuff Size: Normal)   Pulse 100   Resp 18   Ht 5\' 9"  (1.753 m)   Wt 159 lb 3.2 oz (72.2 kg)   SpO2 98% Comment: RA  BMI 23.51 kg/m  General appearance: alert, cooperative and no distress Head: Normocephalic, without obvious abnormality, atraumatic Neck: no adenopathy, no carotid bruit, no JVD, supple, symmetrical, trachea midline and thyroid not enlarged,  symmetric, no tenderness/mass/nodules Lymph nodes: Cervical, supraclavicular, and axillary nodes normal. Resp: clear to auscultation bilaterally Cardio: regular rate and rhythm, S1, S2 normal, no murmur, click, rub or gallop GI: soft, non-tender; bowel sounds normal; no masses,  no organomegaly Extremities: extremities normal, atraumatic, no cyanosis or edema and Homans sign is negative, no sign of DVT Neurologic: Grossly normal On exam the patient's left parasternal incision is well-healed there is a slight area of softness above this which is what the patient complains of, though there is no definite herniation of lung tissue this is confirmed on CT  diagnostic Studies & Laboratory data:     Recent Radiology Findings:    Dg Chest 2 View  Result Date: 04/14/2018 CLINICAL DATA:  Cough and congestion with shortness of breath and hoarseness for 3 weeks. History of sarcoidosis. EXAM: CHEST - 2 VIEW COMPARISON:  10/07/2017 FINDINGS: Normal heart size and mediastinal contours. No acute infiltrate or edema. No effusion or pneumothorax. No acute osseous findings. IMPRESSION: No evidence of active disease. Electronically Signed   By: Marnee Spring M.D.   On: 04/14/2018 10:13   Ct Chest Wo Contrast  Result Date: 05/05/2018 CLINICAL DATA:  Sarcoidosis. Patient concern for left lung herniation after left parasternal incision for exploratory surgery. EXAM: CT CHEST WITHOUT CONTRAST TECHNIQUE: Multidetector CT imaging of the chest was performed following the standard protocol without IV contrast. COMPARISON:  PET-CT 05/05/2017 FINDINGS: Cardiovascular: The heart size is normal. No substantial pericardial effusion. No thoracic aortic aneurysm. Mediastinum/Nodes: No mediastinal lymphadenopathy. No evidence for gross hilar lymphadenopathy although assessment is limited by the lack of intravenous contrast on today's study. The esophagus has normal imaging features. 2.1 cm right thyroid nodule is stable since  PET-CT of 1 year ago. There is no axillary lymphadenopathy. Lungs/Pleura: Scattered areas of lucent lung/thin-walled air cysts are noted bilaterally. No focal airspace consolidation. No substantial bronchiectasis. No pulmonary nodule or mass. Minimal scarring noted in the lingula. No substantial perilymphatic nodularity in either lung. Upper Abdomen: Unremarkable. Musculoskeletal: No worrisome lytic or sclerotic osseous abnormality. No evidence for lung herniation into the chest wall. IMPRESSION: 1. No evidence for lung herniation into the chest wall. 2. Scattered tiny thin-walled air cysts in both lungs. Imaging features suggest lymphangioleiomyomatosis. Scattered foci of air trapping possible. Lack of smoking history and pulmonary nodularity makes emphysema and pulmonary Langerhans cell histiocytosis less likely. 3. 2.1 cm right thyroid nodule, unchanged in the 1 year interval since prior PET-CT. Electronically Signed   By: Kennith Center M.D.   On: 05/05/2018 13:18      Recent Lab Findings: Lab Results  Component Value Date   WBC 12.0 (H) 05/30/2017   HGB 11.8 (L) 05/30/2017   HCT 37.2 05/30/2017   PLT 196 05/30/2017   GLUCOSE 110 (H) 05/30/2017   ALT 31 05/30/2017   AST 32 05/30/2017   NA 137 05/30/2017   K 3.5 05/30/2017   CL 104 05/30/2017   CREATININE 0.72 05/30/2017   BUN 12 05/30/2017   CO2 27 05/30/2017   INR 1.04 05/27/2017      Assessment / Plan:   Patient is concerned about the puffiness in left incision now a year after biopsy she likely has some muscle weakness in this area I have written her note to do no more than 20 pounds of lifting at a time.  I have reviewed the CT scan with her in detail that she has no herniation of long between the ribs.  Again of discussed with her that she does not need any surgical intervention at this time   Delight Ovens MD      [address] [place] [place] [address] Office [phone]   Beeper [phone]  05/05/2018  2:37 PM

## 2018-07-02 IMAGING — DX DG CHEST 1V PORT
1 series · 1 of 1 positions shown · non-contrast
Comparison: 05/27/2017

CLINICAL DATA: Postop VATS

EXAM:
PORTABLE CHEST 1 VIEW

[chest ap]
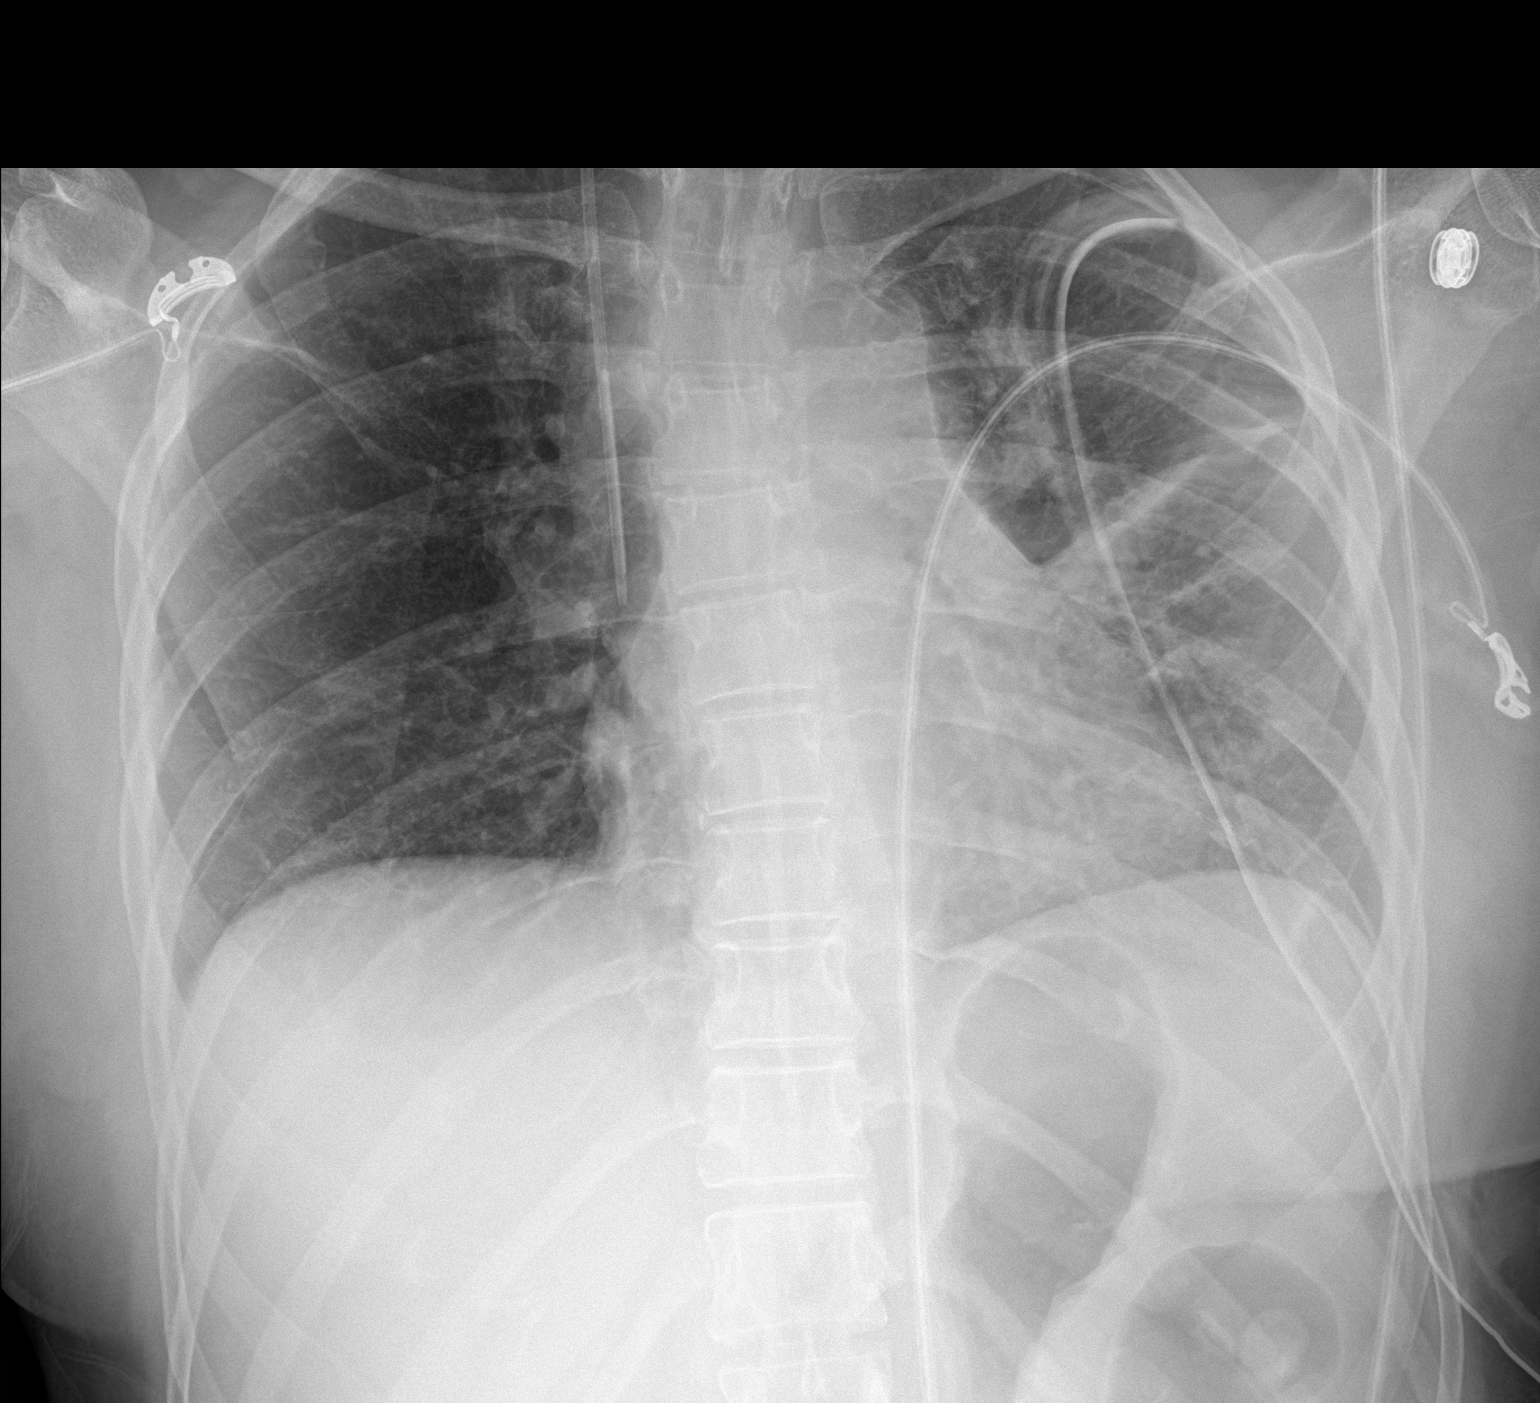

[1 of 1 positions shown; findings below may reference images not displayed]

FINDINGS: Right jugular central venous catheter with the tip projecting over
the SVC.

Left-sided chest tube directed towards the apex. Partial left upper
lobectomy. Postsurgical changes in the left mid lung with
atelectasis. Small left apical pneumothorax measuring less than 10%.
No pleural effusion. Stable cardiomediastinal silhouette. No acute
osseous abnormality.
IMPRESSION: 1. Left-sided chest tube directed towards the apex. Small left
apical pneumothorax measuring less than 10%. Partial left upper
lobectomy with postsurgical changes.

## 2018-07-04 IMAGING — DX DG CHEST 2V
2 series · 2 of 2 positions shown · non-contrast
Comparison: 05/29/2017

CLINICAL DATA: Status post VATS.  Chest tube removal.

EXAM:
CHEST  2 VIEW

[w chest lat]
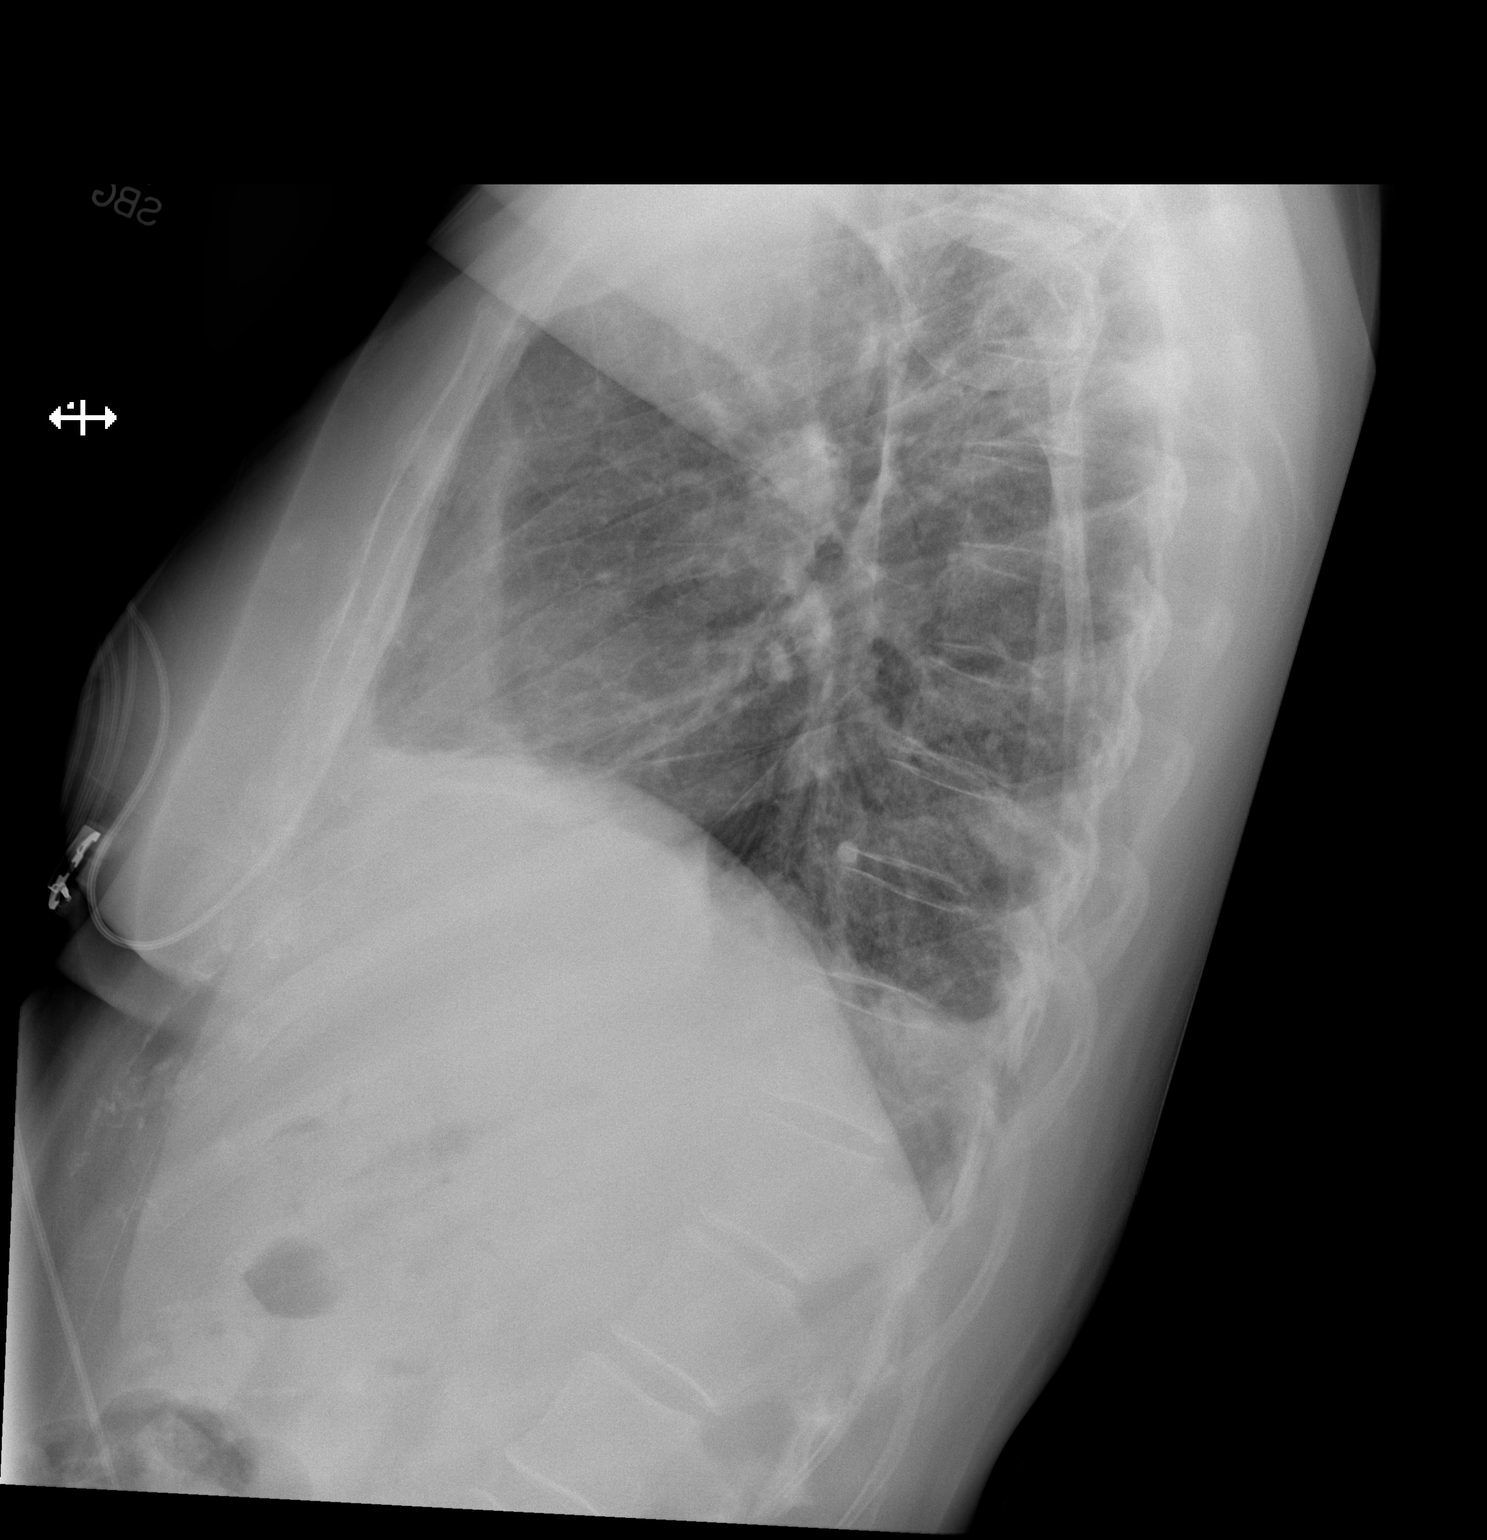

[x chest ap]
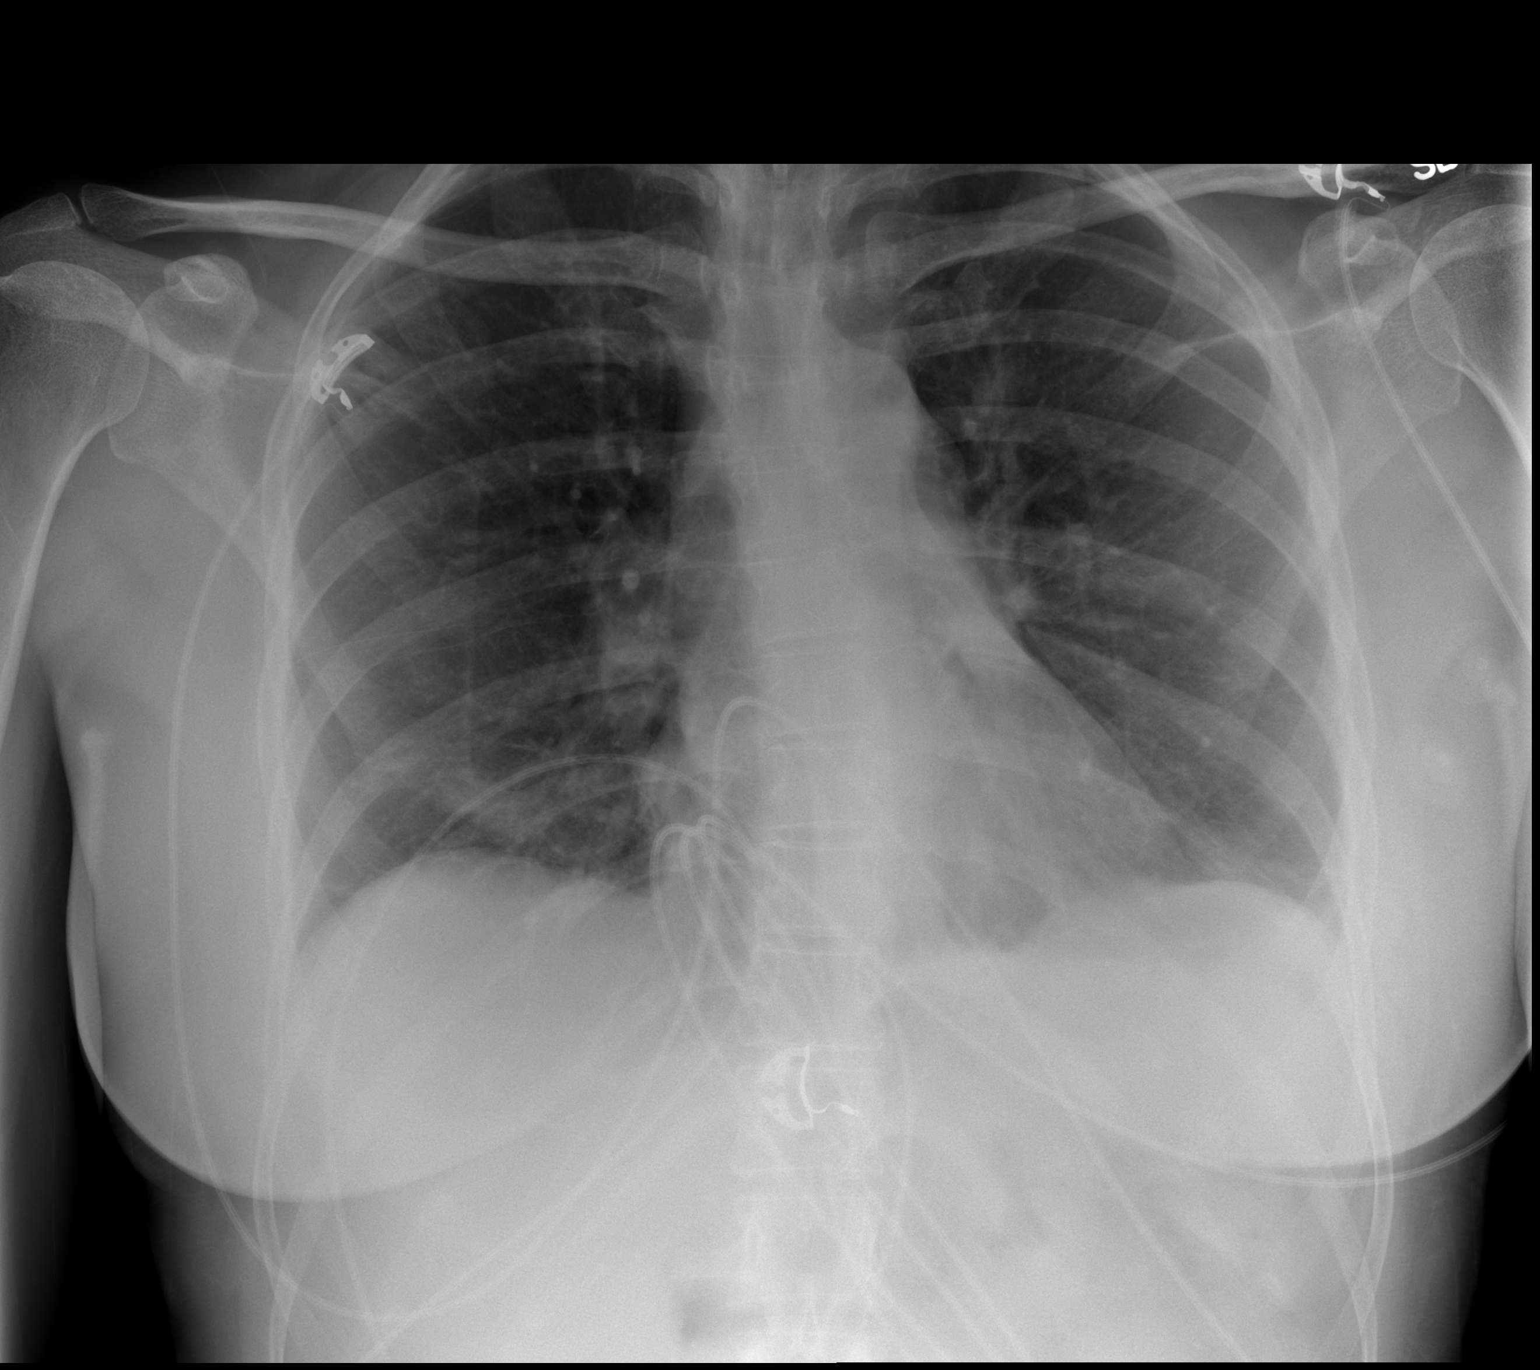

[2 of 2 positions shown; findings below may reference images not displayed]

FINDINGS: Interval removal of left chest tube. Tiny left apical pneumothorax.
Bibasilar atelectasis. Heart is normal size. Small left effusion
noted.
IMPRESSION: Interval removal of left chest tube with tiny left apical
pneumothorax.

Bibasilar atelectasis, small left effusion.

## 2018-09-12 ENCOUNTER — Telehealth: Payer: Self-pay | Admitting: Pulmonary Disease

## 2018-09-12 NOTE — Telephone Encounter (Signed)
Spoke with patient. She was concerned about going to work. She sees BQ for sarcoid. She is not currently sick but wanted to know if she should continue going to work. She is currently not having any symptoms. She has not travelled anywhere internationally or domestically. 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   Because of her history of sarcoid, I explained to her the basics of hand hygiene and trying to limit her exposure to the sick children. When I asked if she had any masks at work, she stated that she did not. I have left 2 masks up front for her to come by and pick up. Explained to her that was a one time deal, she expressed understanding.   Nothing further needed at time of call.

## 2018-09-30 ENCOUNTER — Telehealth: Payer: Self-pay | Admitting: Pulmonary Disease

## 2018-09-30 NOTE — Telephone Encounter (Signed)
Spoke with pt, I went over the Covid 19 precautions with her and wearing mask when she goes to get essential things. I advised her to make her visits only as needed and wash her hands as much as possible. Pt understood. Nothing further is needed.

## 2018-10-13 ENCOUNTER — Telehealth: Payer: Self-pay

## 2018-10-13 NOTE — Telephone Encounter (Signed)
I contacted the pt in regards to moving her May new pt up to VV.  Pt was advised Due to current COVID 19 pandemic, our office is severely reducing in office visits, in order to minimize the risk to our patients and healthcare providers.  Pt declined vv and stated she would like to wait for a face to face.  Pt was scheduled for July and is on the wait list.

## 2018-10-31 ENCOUNTER — Telehealth: Payer: Self-pay | Admitting: Pulmonary Disease

## 2018-10-31 NOTE — Telephone Encounter (Signed)
Follow state recommendations

## 2018-10-31 NOTE — Telephone Encounter (Signed)
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 Pt states school could potentially reopen when state reopens.  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 Pt stated they are going to be doing all the proper guidelines and the teachers will be provided masks to wear at the facility.  I stated to pt as long as she follows all the proper precautions, wears a mask and follows proper hand hygiene, as long as nobody at the facility have been sick and have not done any recent travelling nor if they have been around anyone that has been exposed to COVID, all should be okay for her to return back to work.  Even after stating this to pt, pt wanted me to check with BQ to see if he felt like it would be okay for pt to return back to work when the facility reopens. Pt is aware that BQ is working at hospital due to COVID and stated to her that we would get back with her as soon as we could in regards to response from him.  Dr. Kendrick Fries please advise on this for pt if you feel like it would be safe for pt to return back to work at the child care facility as long as they are following all the proper precaution guidelines that are in place?

## 2018-11-01 NOTE — Telephone Encounter (Signed)
Advised patient per BQ suggestions. Nothing further needed.

## 2018-11-01 NOTE — Telephone Encounter (Signed)
Left message for patient to call back  

## 2018-11-02 ENCOUNTER — Ambulatory Visit: Payer: 59 | Admitting: Pulmonary Disease

## 2018-11-03 ENCOUNTER — Ambulatory Visit: Payer: 59 | Admitting: Neurology

## 2018-11-11 ENCOUNTER — Other Ambulatory Visit: Payer: Self-pay

## 2018-11-11 ENCOUNTER — Encounter (INDEPENDENT_AMBULATORY_CARE_PROVIDER_SITE_OTHER): Payer: 59 | Admitting: Ophthalmology

## 2018-11-11 DIAGNOSIS — H33303 Unspecified retinal break, bilateral: Secondary | ICD-10-CM | POA: Diagnosis not present

## 2018-11-11 DIAGNOSIS — H43813 Vitreous degeneration, bilateral: Secondary | ICD-10-CM

## 2018-11-11 DIAGNOSIS — H2513 Age-related nuclear cataract, bilateral: Secondary | ICD-10-CM

## 2018-11-11 DIAGNOSIS — H35033 Hypertensive retinopathy, bilateral: Secondary | ICD-10-CM

## 2018-11-11 DIAGNOSIS — I10 Essential (primary) hypertension: Secondary | ICD-10-CM

## 2018-11-11 IMAGING — DX DG CHEST 2V
2 series · 2 of 2 positions shown · non-contrast
Comparison: 06/25/2017

CLINICAL DATA: Shortness of breath for 2 weeks, history of
left-sided VATS

EXAM:
CHEST - 2 VIEW

[dg chest 2 view (1 of 2)]
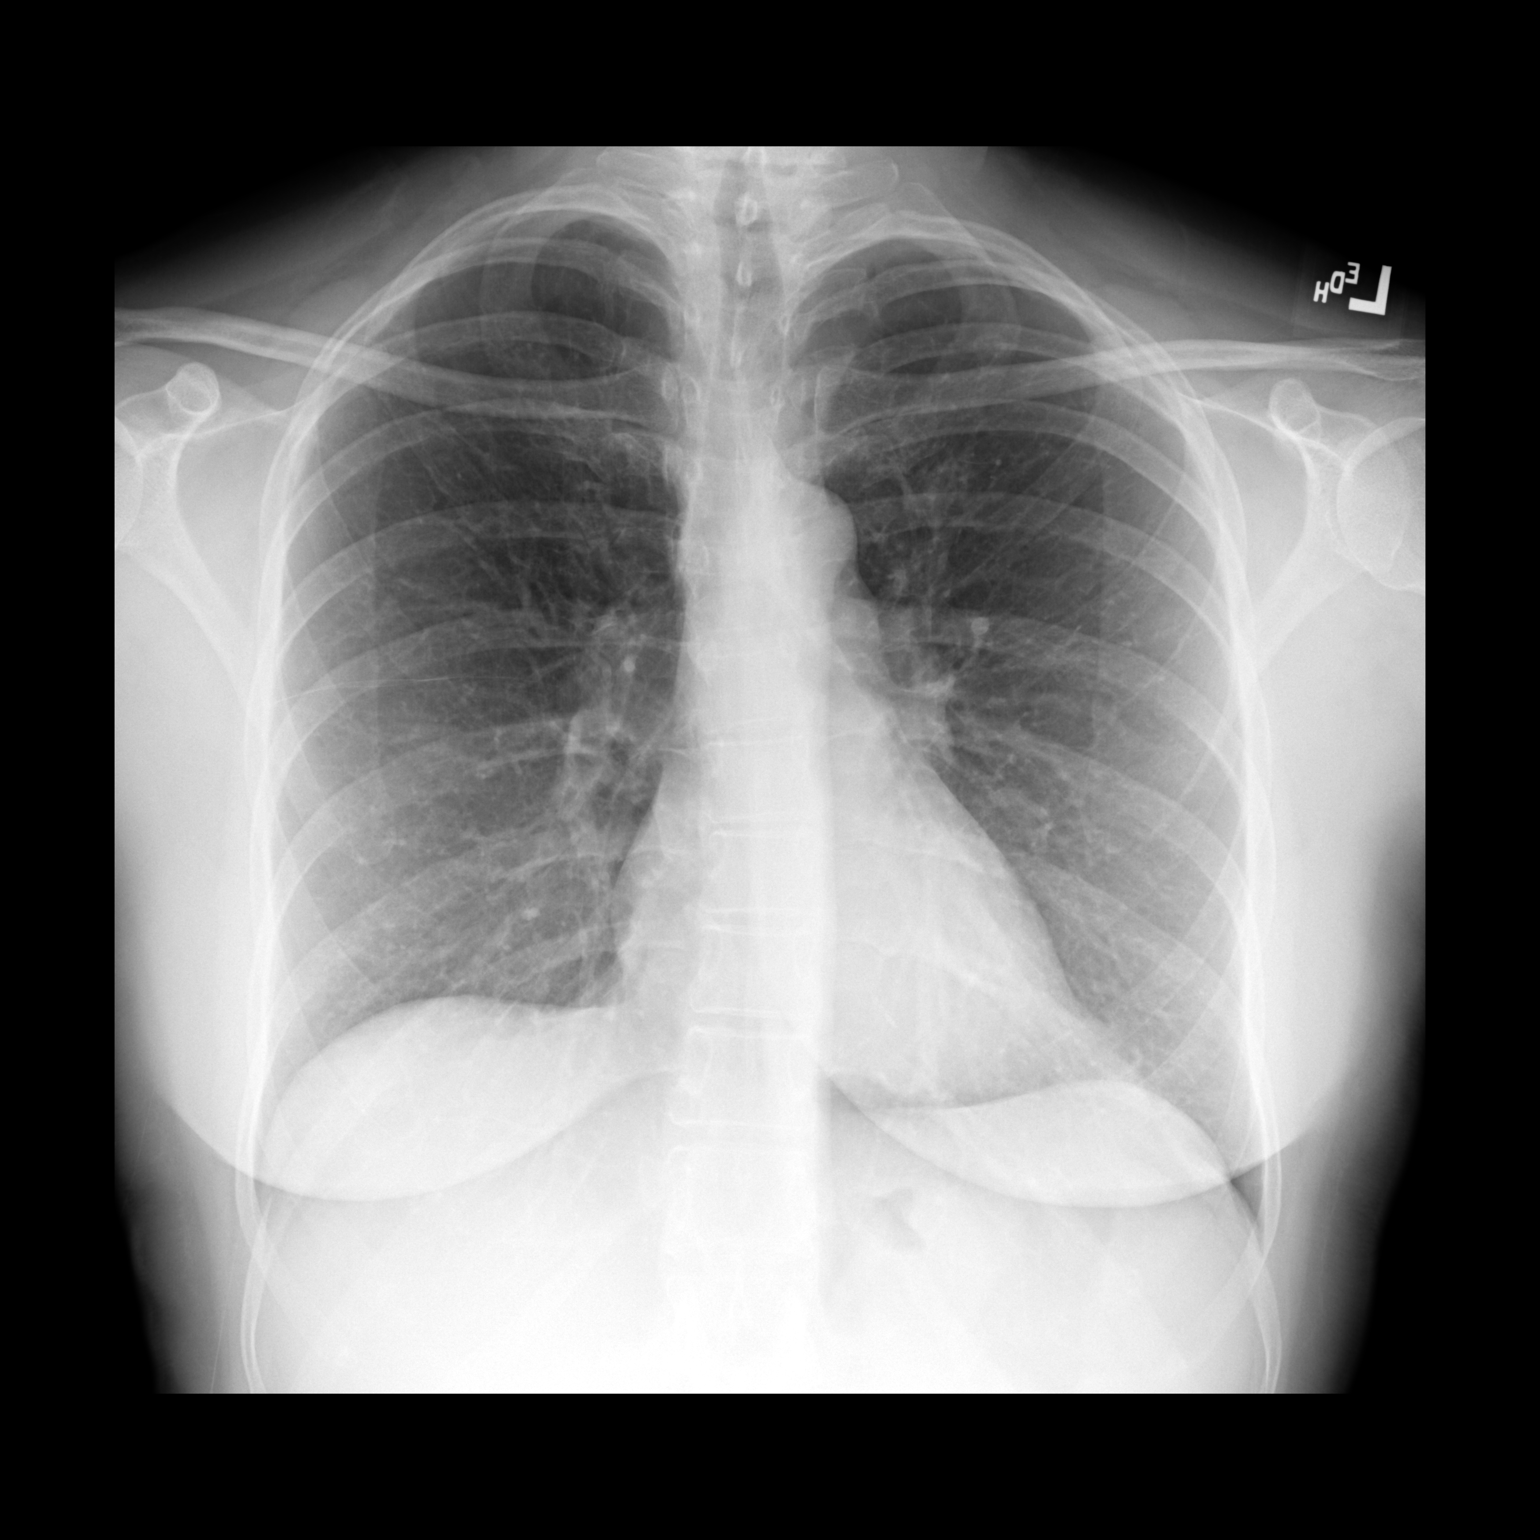

[dg chest 2 view (2 of 2)]
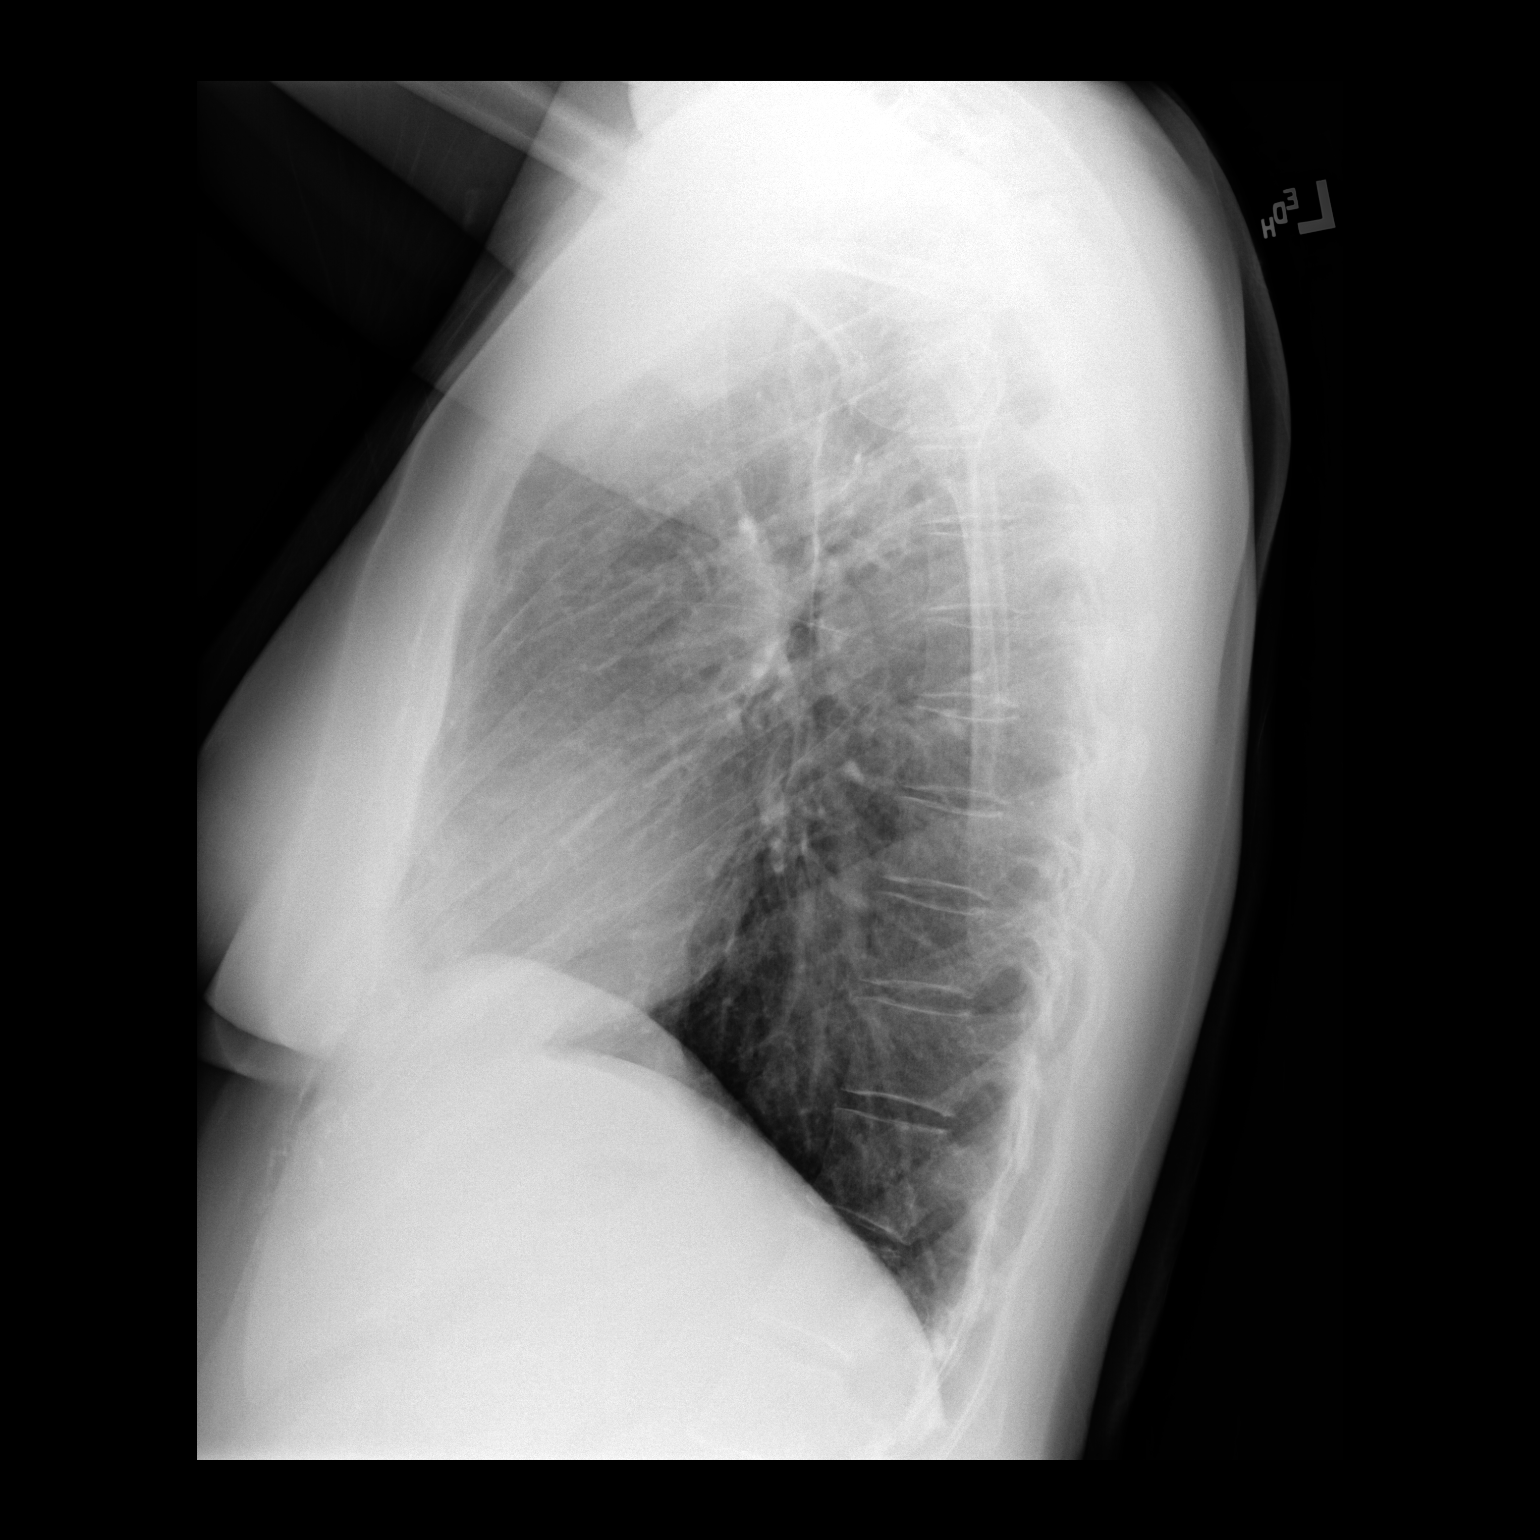

[2 of 2 positions shown; findings below may reference images not displayed]

FINDINGS: The heart size and mediastinal contours are within normal limits.
Both lungs are clear. The visualized skeletal structures are
unremarkable.
IMPRESSION: No active cardiopulmonary disease.

## 2019-01-07 ENCOUNTER — Telehealth: Payer: Self-pay | Admitting: Internal Medicine

## 2019-01-07 MED ORDER — AZITHROMYCIN 250 MG PO TABS: 250.0000 mg | ORAL_TABLET | Freq: Once | ORAL | 0 refills | Status: AC

## 2019-01-07 NOTE — Telephone Encounter (Signed)
S: Started Thurs night with ear fullness progressing to throat irritation.  Now losing voice.  Similar to prior presentation that responded to azithromycin.  No cough, fever, or SOB.  A/P: sounds like usual viral URI, however given history of nonresolving symptoms in past I stated she could get azithromycin filled again if progresses to cough and dyspnea.  She was instructed to call if not feeling better.  She is due to f/u appt at some point for her stage 1 sarcoid, will message her primary pulmonologist regarding.  Erskine Emery MD

## 2019-01-07 NOTE — Telephone Encounter (Signed)
thanks

## 2019-01-09 NOTE — Telephone Encounter (Signed)
LMTCB. Please inquire as to whether patient would like to be seen. She was instructed to call back if she was not feeling well.

## 2019-01-11 ENCOUNTER — Telehealth: Payer: Self-pay | Admitting: Pulmonary Disease

## 2019-01-11 NOTE — Telephone Encounter (Signed)
LMTCB

## 2019-01-12 NOTE — Telephone Encounter (Signed)
Noted. Nothing further needed. 

## 2019-01-19 ENCOUNTER — Ambulatory Visit: Payer: 59 | Admitting: Neurology

## 2019-01-19 ENCOUNTER — Other Ambulatory Visit: Payer: Self-pay

## 2019-01-19 ENCOUNTER — Encounter: Payer: Self-pay | Admitting: Neurology

## 2019-01-19 VITALS — BP 132/68 | HR 94 | Temp 98.0°F | Ht 69.0 in | Wt 162.0 lb

## 2019-01-19 DIAGNOSIS — R202 Paresthesia of skin: Secondary | ICD-10-CM | POA: Diagnosis not present

## 2019-01-19 NOTE — Progress Notes (Signed)
Reason for visit: Paresthesias  Referring physician: Dr. [name] is a 53 y.o. female  History of present illness:  Kristin [name] is a 53 year old right-handed white female with a history of migraine headaches, irritable bowel syndrome, possible fibromyalgia, sarcoidosis affecting the intrathoracic lymph nodes, and a prior history of intermittent sensory alterations.  The patient apparently was seen through this practice and 1996 by Dr. Rosiland Oz, an MRI work-up at that time was unremarkable.  The patient was told that the sensory symptoms were related to stress.  She claims that the problems went away for a number of years only to resurface in February 2020.  The patient has been getting physical therapy for a problem with her left shoulder, immediately after a therapy session, she began to have some tingling sensations on the face bilaterally lasting about 20 minutes and then disappearing.  This recurred a week later with another physical therapy session and continue to occur with or without physical therapy until the present time, the episodes may now affect the hands and arms, occasionally the foot or leg may be affected.  The episodes may last anywhere from 30 minutes to all day long but the patient will have time periods between events that are associated with feeling normal.  The patient denies any weakness or any significant balance issues, she denies issues controlling the bowels or the bladder.  She does report some fatigue problems.  The sensory symptoms when they do come on are oftentimes bilateral in nature and symmetric.  She does not clearly relate the sensory changes to her migraine headaches although sometimes she can have tingling during the headache.  She is sent to this office for further evaluation.  Past Medical History:  Diagnosis Date  . Anxiety   . Dizziness   . Dysrhythmia    palpitations  . Fibromyalgia 04/13/2017  . GERD (gastroesophageal reflux disease)   .  Headache   . History of migraine headaches   . Hypertension    hx arrhythmia  . IBS (irritable bowel syndrome)   . Lymphadenopathy   . Seasonal allergies   . Thyroid nodule   . Vertigo   . Vitamin D deficiency     Past Surgical History:  Procedure Laterality Date  . DILATION AND CURETTAGE OF UTERUS  2006, 2009  . EYE SURGERY Bilateral 1997   LASER  . THYOID NODULE  05/2009   FNA...DR. Davonna Belling .Marland KitchenMarland KitchenMarland KitchenNEG  . VAGINAL HYSTERECTOMY  12/2017   LAPAROSCOPICALLY ASSISTED/DR. ELIZABETH SKINNER  . VIDEO ASSISTED THORACOSCOPY (VATS)/ LYMPH NODE SAMPLING Left 05/28/2017   Procedure: LEFT PERISTERNAL EXPLORATION, LEFT VIDEO ASSISTED THORACOSCOPY (VATS), MEDIASTINAL AND AP WINDOW LYMPH NODE DISSECTION;  Surgeon: Grace Isaac, MD;  Location: [facility];  Service: Thoracic;  Laterality: Left;  Marland Kitchen VIDEO BRONCHOSCOPY WITH ENDOBRONCHIAL ULTRASOUND N/A 05/17/2017   Procedure: VIDEO BRONCHOSCOPY WITH ENDOBRONCHIAL ULTRASOUND WITH TRANSBRONCHIAL BX OF LYMPH NODES 7, 10 R AND 10 L.;  Surgeon: Grace Isaac, MD;  Location: [facility];  Service: Thoracic;  Laterality: N/A;    Family History  Problem Relation Age of Onset  . Hypertension Mother   . Diabetes Mother   . Hyperthyroidism Mother   . Hypertension Father   . Heart disease Father   . Hypothyroidism Father   . Cardiomyopathy Father   . Other Father 51       CAUSE OF DEATH..CEREBRAL HEMORR  . Cardiomyopathy Paternal Uncle   . Heart disease Paternal Uncle  CABG  . Diabetes Maternal Grandmother   . Hypertension Maternal Grandmother   . Heart disease Maternal Grandmother        MI  . Other Maternal Grandmother        GRAVES DISEASE  . Cardiomyopathy Maternal Grandmother   . Other Maternal Grandfather        AAA  . Cardiomyopathy Maternal Grandfather   . Heart disease Maternal Grandfather        MI  . Cancer Paternal Grandmother        BREAST, OTHER UNSPECIFIED SITE  . Hypertension Paternal Grandfather   . Stroke Paternal  Grandfather        ISCHEMIC STROKE  . Other Maternal Aunt        THYROID DISEASE  . Heart disease Paternal Aunt        CABG  . Cardiomyopathy Paternal Aunt   . Cancer Paternal Aunt        STOMACH  . Other Cousin        THYROID   . Breast cancer Neg Hx     Social history:  reports that she has never smoked. She has never used smokeless tobacco. She reports that she does not drink alcohol or use drugs.  Medications:  Prior to Admission medications   Medication Sig Start Date End Date Taking? Authorizing Provider  acetaminophen (TYLENOL) 500 MG tablet Take 500-1,000 mg every 8 (eight) hours as needed by mouth for mild pain (depends on pain if takes 12 tablets).    Yes [provider]  albuterol (PROAIR HFA) 108 (90 Base) MCG/ACT inhaler Inhale 2 puffs into the lungs every 4 (four) hours as needed for wheezing or shortness of breath. 11/03/17  Yes Lupita LeashMcQuaid, Douglas B, MD  aluminum-magnesium hydroxide-simethicone (MAALOX) 200-200-20 MG/5ML SUSP Take 30 mLs by mouth 2 (two) times daily as needed.   Yes [provider]  Artificial Tear Solution (SOOTHE XP OP) Apply 2 drops at bedtime to eye.   Yes [provider]  Biotin 1610910000 MCG TABS Take 1 tablet by mouth daily.   Yes [provider]  Cholecalciferol (VITAMIN D3) 5000 units CAPS Take 5,000 Units daily by mouth.    Yes [provider]  doxepin (SINEQUAN) 10 MG capsule Take 40-60 mg at bedtime by mouth. Depends on IBS symptoms and migraines if takes 4-6   Yes [provider]  frovatriptan (FROVA) 2.5 MG tablet Take 2.5 mg as needed by mouth for migraine (take 1 tablet at onset of migraine and repeat up to twice if needed). If recurs, may repeat after 2 hours. Max of 3 tabs in 24 hours.    Yes [provider]  loperamide (IMODIUM) 2 MG capsule Take 2-4 mg as needed by mouth for diarrhea or loose stools.   Yes [provider]  loratadine (CLARITIN) 10 MG tablet Take 10 mg daily  by mouth.    Yes [provider]  losartan (COZAAR) 25 MG tablet Take 25 mg every evening by mouth.    Yes [provider]  Multiple Vitamin (MULTIVITAMIN) tablet Take 1 tablet every other day by mouth.    Yes [provider]  pyridOXINE (VITAMIN B-6) 100 MG tablet Take by mouth.   Yes [provider]  RABEprazole (ACIPHEX) 20 MG tablet Take 20 mg by mouth daily as needed.    Yes [provider]  simethicone (MYLICON) 80 MG chewable tablet Chew 80 mg every 6 (six) hours as needed by mouth for flatulence.  Yes [provider]  sodium chloride (OCEAN) 0.65 % SOLN nasal spray Place 1 spray as needed into both nostrils for congestion.   Yes [provider]      Allergies  Allergen Reactions  . Amoxicillin Hives    Has patient had a PCN reaction causing immediate rash, facial/tongue/throat swelling, SOB or lightheadedness with hypotension: UNKNOWN  Has patient had a PCN reaction causing severe rash involving mucus membranes or skin necrosis:  #  #  #  NO  #  #  #  Has patient had a PCN reaction that required hospitalization  #  #  #  NO  #  #  #  Has patient had a PCN reaction occurring within the last 10 years: #  #  #  YES  #  #  #  If all "NO's", may proceed with Cephalosporin use.  Marland Kitchen. Doxycycline Hives  . Indocin [Indomethacin] Hives  . Levaquin [Levofloxacin] Hives  . Probiotic [Acidophilus] Hives  . Bentyl [Dicyclomine Hcl] Diarrhea  . Erythromycin Nausea Only    STOMACH CRAMPS  . Keflex [Cephalexin] Rash  . Lansoprazole Other (See Comments)    HEADACHE  . Nexium [Esomeprazole] Hives and Other (See Comments)    INTOLERANCE >  HEADACHE   . Sudafed [Pseudoephedrine Hcl] Other (See Comments)    TACHYCARDIA  . Sulfamethoxazole Rash  . Tetanus Toxoids Other (See Comments)    LOCAL REACTION...REDNESS/KNOT    ROS:  Out of a complete 14 system review of symptoms, the patient complains only of the following symptoms, and  all other reviewed systems are negative.  Fatigue Ringing in the ears Moles Diarrhea, constipation Joint pain, achy muscles Allergies Tremor  Blood pressure 132/68, pulse 94, temperature 98 F (36.7 C), height 5\' 9"  (1.753 m), weight 162 lb (73.5 kg).  Physical Exam  General: The patient is alert and cooperative at the time of the examination.  Eyes: Pupils are equal, round, and reactive to light. Discs are flat bilaterally.  Neck: The neck is supple, no carotid bruits are noted.  Respiratory: The respiratory examination is clear.  Cardiovascular: The cardiovascular examination reveals a regular rate and rhythm, no obvious murmurs or rubs are noted.  Skin: Extremities are without significant edema.  Neurologic Exam  Mental status: The patient is alert and oriented x 3 at the time of the examination. The patient has apparent normal recent and remote memory, with an apparently normal attention span and concentration ability.  Cranial nerves: Facial symmetry is present. There is good sensation of the face to pinprick and soft touch bilaterally. The strength of the facial muscles and the muscles to head turning and shoulder shrug are normal bilaterally. Speech is well enunciated, no aphasia or dysarthria is noted. Extraocular movements are full. Visual fields are full. The tongue is midline, and the patient has symmetric elevation of the soft palate. No obvious hearing deficits are noted.  Motor: The motor testing reveals 5 over 5 strength of all 4 extremities. Good symmetric motor tone is noted throughout.  Sensory: Sensory testing is intact to pinprick, soft touch, vibration sensation, and position sense on all 4 extremities. No evidence of extinction is noted.  Coordination: Cerebellar testing reveals good finger-nose-finger and heel-to-shin bilaterally.  Gait and station: Gait is normal. Tandem gait is normal. Romberg is negative. No drift is seen.  Reflexes: Deep tendon  reflexes are symmetric and normal bilaterally. Toes are downgoing bilaterally.   Assessment/Plan:  1.  Intermittent paresthesias  The patient  has a normal neurologic examination.  The patient gives a history of pure sensory events that are intermittent in nature with periods of normality between episodes of symptoms.  The history is most suggestive of benign sensory etiology, possibly associated with a diagnosis of fibromyalgia.  The patient does have a history of sarcoidosis however.  The patient will be set up for blood work today, she will have MRI of the brain and cervical spine with and without gadolinium enhancement.  If the above studies are unremarkable, no further evaluation will be needed.  I will contact the patient when the results are available to me.  Kristin Palau. Keith Willis MD 01/19/2019 4:06 PM  [facility] [address] [place], [place] [id number]  Phone [phone] Fax [phone]

## 2019-01-20 ENCOUNTER — Other Ambulatory Visit: Payer: Self-pay | Admitting: Obstetrics and Gynecology

## 2019-01-20 DIAGNOSIS — Z1231 Encounter for screening mammogram for malignant neoplasm of breast: Secondary | ICD-10-CM

## 2019-01-23 ENCOUNTER — Telehealth: Payer: Self-pay | Admitting: Neurology

## 2019-01-23 NOTE — Telephone Encounter (Signed)
[employer] pending faxed notes 

## 2019-01-24 NOTE — Telephone Encounter (Signed)
I check the status on the MRI's the Cervical is still pending the MRI Brain was approved.  Auth: [id number] (exp. 01/23/19 to 03/09/19)

## 2019-01-25 ENCOUNTER — Telehealth: Payer: Self-pay

## 2019-01-25 LAB — COMPREHENSIVE METABOLIC PANEL
ALT: 18 IU/L (ref 0–32)
AST: 16 IU/L (ref 0–40)
Albumin/Globulin Ratio: 1.8 (ref 1.2–2.2)
Albumin: 4.8 g/dL (ref 3.8–4.9)
Alkaline Phosphatase: 97 IU/L (ref 39–117)
BUN/Creatinine Ratio: 21 (ref 9–23)
BUN: 17 mg/dL (ref 6–24)
Bilirubin Total: 0.3 mg/dL (ref 0.0–1.2)
CO2: 22 mmol/L (ref 20–29)
Calcium: 9.6 mg/dL (ref 8.7–10.2)
Chloride: 103 mmol/L (ref 96–106)
Creatinine, Ser: 0.82 mg/dL (ref 0.57–1.00)
GFR calc Af Amer: 95 mL/min/{1.73_m2} (ref >59)
GFR calc non Af Amer: 83 mL/min/{1.73_m2} (ref >59)
Globulin, Total: 2.7 g/dL (ref 1.5–4.5)
Glucose: 72 mg/dL (ref 65–99)
Potassium: 4.2 mmol/L (ref 3.5–5.2)
Sodium: 142 mmol/L (ref 134–144)
Total Protein: 7.5 g/dL (ref 6.0–8.5)

## 2019-01-25 LAB — ANA W/REFLEX: Anti Nuclear Antibody (ANA): NEGATIVE

## 2019-01-25 LAB — SEDIMENTATION RATE: Sed Rate: 18 mm/hr (ref 0–40)

## 2019-01-25 LAB — COPPER, SERUM: Copper: 112 ug/dL (ref 72–166)

## 2019-01-25 LAB — B. BURGDORFI ANTIBODIES: Lyme IgG/IgM Ab: <0.91 {ISR} (ref 0.00–0.90)

## 2019-01-25 LAB — VITAMIN B12: Vitamin B-12: 785 pg/mL (ref 232–1245)

## 2019-01-25 NOTE — Telephone Encounter (Signed)
I called on the status for the MRI Cervical..  They informed me that [facility] has denied the exam. There is an option to do a peer to peer. The phone number is [phone] option 3. The case number is [id number]. The peer to peer would have to be done in 14 days.

## 2019-01-25 NOTE — Telephone Encounter (Signed)
Lvm asking for a call back to further discuss.

## 2019-01-25 NOTE — Telephone Encounter (Signed)
-----   Message from Kathrynn Ducking, MD sent at 01/25/2019  7:43 AM EDT -----  The blood work results are unremarkable. Please call the patient. ----- Message ----- From: Lavone Neri Lab Results In Sent: 01/20/2019   7:37 AM EDT To: Kathrynn Ducking, MD

## 2019-01-26 NOTE — Telephone Encounter (Signed)
[employer] denial reason Fibromyalgia, we cannot approve this request. Member is a middle aged female. The reason this request cannot be approved is because: Guidelines do not support imaging in the evaluation or treatment of fibromyalgia unless there are specific clinical symptoms or findings unrelated to fibromyalgia in the spinal region for which this request is made.  There is an option to do a peer to peer. The phone number is [phone] is an option to do a peer to peer. The phone number is [phone] option 3. The case number is [id number]. The peer to peer would have to be done in 14 days. [id number] option 3. The case number is [id number]. The peer to peer would have to be done in 13 days.

## 2019-01-26 NOTE — Telephone Encounter (Addendum)
Pt returned my call and I was able to advise of the results. She verbalized understanding.

## 2019-01-26 NOTE — Telephone Encounter (Signed)
Left second vm for pt to call back.

## 2019-01-26 NOTE — Telephone Encounter (Signed)
MRI of the cervical spine was approved following peer-to-peer conference.  Approval number [id number]

## 2019-01-30 NOTE — Telephone Encounter (Signed)
[place]: [id number] (exp. 03/09/19) & [phone] (exp. 03/12/19)  LVM for patient to call back about scheduling her MRI's.

## 2019-01-30 NOTE — Telephone Encounter (Signed)
Noted, thank you,

## 2019-02-01 NOTE — Telephone Encounter (Signed)
Patient returned my call she is scheduled for 02/08/19 at [facility]. No to the covid questions.

## 2019-02-08 ENCOUNTER — Ambulatory Visit: Payer: 59

## 2019-02-08 ENCOUNTER — Other Ambulatory Visit: Payer: Self-pay

## 2019-02-08 ENCOUNTER — Other Ambulatory Visit: Payer: Self-pay | Admitting: Neurology

## 2019-02-08 DIAGNOSIS — R202 Paresthesia of skin: Secondary | ICD-10-CM

## 2019-02-11 ENCOUNTER — Telehealth: Payer: Self-pay | Admitting: Neurology

## 2019-02-11 NOTE — Telephone Encounter (Signed)
I called the patient.  MRI of the brain and cervical spine are completely normal.  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  No indication for further work-up.  The patient did not wish to have contrast with the studies, therefore the studies were done without contrast only.   MRI cervical 02/10/19:  IMPRESSION: Unremarkable MRI scan of the cervical spine without contrast.   MRI brain 02/10/19:  IMPRESSION: Unremarkable MRI scan of the brain without contrast.

## 2019-03-05 ENCOUNTER — Telehealth: Payer: Self-pay | Admitting: Pulmonary Disease

## 2019-03-05 NOTE — Telephone Encounter (Signed)
Pt called to report symptoms. C/o sore throat, ear pain, sinus congestion for past 7 days. Reports fevers to 100.2 in past 24 hrs. Cough with dark yellow, green sputum. No dyspnea Got tested for COVID on 9/3 which was negative.  Looks like viral URI that may be progressing to bronchitis.  She will try mucinex OTC, stay hydrated She already had a prescription for Z pack from July 2020 that she has not taken yet.  She will take the Z pack if no improvement in the next 24-48 hrs  Marshell Garfinkel MD [facility] 03/05/2019, 3:26 PM

## 2019-03-10 ENCOUNTER — Other Ambulatory Visit: Payer: Self-pay

## 2019-03-10 ENCOUNTER — Ambulatory Visit
Admission: RE | Admit: 2019-03-10 | Discharge: 2019-03-10 | Disposition: A | Discharge status: Home / Self Care | Payer: 59 | Source: Ambulatory Visit | Attending: Obstetrics and Gynecology | Admitting: Obstetrics and Gynecology

## 2019-03-10 DIAGNOSIS — Z1231 Encounter for screening mammogram for malignant neoplasm of breast: Secondary | ICD-10-CM

## 2019-03-14 ENCOUNTER — Telehealth: Payer: Self-pay

## 2019-03-14 NOTE — Telephone Encounter (Signed)
LVMTCB x 1 for patient to schedule vaccination and future appointment.

## 2019-03-14 NOTE — Telephone Encounter (Signed)
03/14/2019 1517  Unfortunately no patient will need to receive Pneumovax 23 after 03/27/2019.  Immunization History  Administered Date(s) Administered  . Influenza-Unspecified 04/13/2017  . Pneumococcal Conjugate-13 03/25/2018    Please have patient establish with Dr. Vaughan Browner for future follow-up as Dr. Lake Bells not be returning to clinic.  This needs to be a 30-minute office visit.  Needs to be scheduled within the next 4 to 6 weeks.   Wyn Quaker, FNP

## 2019-03-14 NOTE — Telephone Encounter (Signed)
ATC pt, no answer. Left message for pt to call back.   Patient received Prevnar 13 on 03/25/2018. Can she get the pneumococcal 23 now? Can someone address for BQ.

## 2019-03-15 NOTE — Telephone Encounter (Signed)
ATC patient unable to reach, left message to call to make an appointment. Dr. Vaughan Browner is here first full week in October can get vaccine and appt then

## 2019-03-16 NOTE — Telephone Encounter (Signed)
LMTCB and will close per triage protocol 

## 2019-04-07 ENCOUNTER — Ambulatory Visit: Payer: 59 | Admitting: Internal Medicine

## 2019-05-05 ENCOUNTER — Encounter: Payer: Self-pay | Admitting: Internal Medicine

## 2019-05-05 ENCOUNTER — Ambulatory Visit: Payer: 59 | Admitting: Internal Medicine

## 2019-05-05 ENCOUNTER — Other Ambulatory Visit: Payer: Self-pay

## 2019-05-05 VITALS — BP 110/74 | HR 79 | Temp 98.4°F | Ht 68.0 in | Wt 160.0 lb

## 2019-05-05 DIAGNOSIS — Q330 Congenital cystic lung: Secondary | ICD-10-CM

## 2019-05-05 DIAGNOSIS — Z23 Encounter for immunization: Secondary | ICD-10-CM

## 2019-05-05 DIAGNOSIS — J849 Interstitial pulmonary disease, unspecified: Secondary | ICD-10-CM

## 2019-05-05 NOTE — Progress Notes (Signed)
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 Follow-up    Sarcoidosis patient stated breathing has been ok no change.    HPI 53 year old woman presenting for followup of sarcoidosis.  Former patient of [facility]. -Sarcoidosis diagnosed after abnormal CXR leading to CT leading to PET leading to mediastinoscopy in 2018 with noncaseating granulomas on biopsy. Issues with recurrent laryngitis. 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 -R thyroid nodule stable in size and PET neg  Still has left anterior chest wall extrusion with coughing and some weakness there as well. Otherwise doing well, no ER visits, MMRC 0.  ROS + symptoms in bold Fevers, chills, weight loss Nausea, vomiting, diarrhea Shortness of breath, wheezing, cough Chest pain, palpitations, lower ext edema   Past Medical History:  Diagnosis Date  . Anxiety   . Dizziness   . Dysrhythmia    palpitations  . Fibromyalgia 04/13/2017  . GERD (gastroesophageal reflux disease)   . Headache   . History of migraine headaches   . Hypertension    hx arrhythmia  . IBS (irritable bowel syndrome)   . Lymphadenopathy   . Seasonal allergies   . Thyroid nodule   . Vertigo   . Vitamin D deficiency      Family History  Problem Relation Age of Onset  . Hypertension Mother   . Diabetes Mother   . Hyperthyroidism Mother   . Hypertension Father   . Heart disease Father   . Hypothyroidism Father   . Cardiomyopathy Father   . Other Father 54       CAUSE OF DEATH..CEREBRAL HEMORR  . Cardiomyopathy Paternal Uncle   . Heart disease Paternal Uncle        CABG  . Diabetes Maternal Grandmother   . Hypertension Maternal Grandmother   . Heart disease Maternal Grandmother        MI  . Other Maternal Grandmother        GRAVES DISEASE   . Cardiomyopathy Maternal Grandmother   . Other Maternal Grandfather        AAA  . Cardiomyopathy Maternal Grandfather   . Heart disease Maternal Grandfather        MI  . Cancer Paternal Grandmother        BREAST, OTHER UNSPECIFIED SITE  . Hypertension Paternal Grandfather   . Stroke Paternal Grandfather        ISCHEMIC STROKE  . Other Maternal Aunt        THYROID DISEASE  . Heart disease Paternal Aunt        CABG  . Cardiomyopathy Paternal Aunt   . Cancer Paternal Aunt        STOMACH  . Other Cousin        THYROID   . Breast cancer Neg Hx      Past Surgical History:  Procedure Laterality Date  . DILATION AND CURETTAGE OF UTERUS  2006, 2009  . EYE SURGERY Bilateral 1997   LASER  . THYOID NODULE  05/2009   FNA...DR. Davonna Belling .Marland KitchenMarland KitchenMarland KitchenNEG  . VAGINAL HYSTERECTOMY  12/2017   LAPAROSCOPICALLY ASSISTED/DR. ELIZABETH SKINNER  . VIDEO ASSISTED THORACOSCOPY (VATS)/ LYMPH NODE SAMPLING Left 05/28/2017   Procedure: LEFT PERISTERNAL EXPLORATION, LEFT VIDEO ASSISTED THORACOSCOPY (VATS), MEDIASTINAL AND AP WINDOW LYMPH NODE DISSECTION;  Surgeon: Grace Isaac, MD;  Location: [facility]  OR;  Service: Thoracic;  Laterality: Left;  Marland Kitchen VIDEO BRONCHOSCOPY WITH ENDOBRONCHIAL ULTRASOUND N/A 05/17/2017   Procedure: VIDEO BRONCHOSCOPY WITH ENDOBRONCHIAL ULTRASOUND WITH TRANSBRONCHIAL BX OF LYMPH NODES 7, 10 R AND 10 L.;  Surgeon: Delight Ovens, MD;  Location: [facility] OR;  Service: Thoracic;  Laterality: N/A;    Social History   Socioeconomic History  . Marital status: Single    Spouse name: Not on file  . Number of children: Not on file  . Years of education: Not on file  . Highest education level: Not on file  Occupational History  . Not on file  Social Needs  . Financial resource strain: Not on file  . Food insecurity    Worry: Not on file    Inability: Not on file  . Transportation needs    Medical: Not on file    Non-medical: Not on file  Tobacco Use  . Smoking status: Never Smoker   . Smokeless tobacco: Never Used  Substance and Sexual Activity  . Alcohol use: No  . Drug use: No  . Sexual activity: Never    Partners: Male  Lifestyle  . Physical activity    Days per week: Not on file    Minutes per session: Not on file  . Stress: Not on file  Relationships  . Social Musician on phone: Not on file    Gets together: Not on file    Attends religious service: Not on file    Active member of club or organization: Not on file    Attends meetings of clubs or organizations: Not on file    Relationship status: Not on file  . Intimate partner violence    Fear of current or ex partner: Not on file    Emotionally abused: Not on file    Physically abused: Not on file    Forced sexual activity: Not on file  Other Topics Concern  . Not on file  Social History Narrative  . Not on file     Allergies  Allergen Reactions  . Amoxicillin Hives    Has patient had a PCN reaction causing immediate rash, facial/tongue/throat swelling, SOB or lightheadedness with hypotension: UNKNOWN  Has patient had a PCN reaction causing severe rash involving mucus membranes or skin necrosis:  #  #  #  NO  #  #  #  Has patient had a PCN reaction that required hospitalization  #  #  #  NO  #  #  #  Has patient had a PCN reaction occurring within the last 10 years: #  #  #  YES  #  #  #  If all "NO's", may proceed with Cephalosporin use.  Marland Kitchen Doxycycline Hives  . Indocin [Indomethacin] Hives  . Levaquin [Levofloxacin] Hives  . Probiotic [Acidophilus] Hives  . Bentyl [Dicyclomine Hcl] Diarrhea  . Erythromycin Nausea Only    STOMACH CRAMPS  . Keflex [Cephalexin] Rash  . Lansoprazole Other (See Comments)    HEADACHE  . Nexium [Esomeprazole] Hives and Other (See Comments)    INTOLERANCE >  HEADACHE   . Sudafed [Pseudoephedrine Hcl] Other (See Comments)    TACHYCARDIA  . Sulfamethoxazole Rash  . Tetanus Toxoids Other (See Comments)    LOCAL REACTION...REDNESS/KNOT      Outpatient Medications Prior to Visit  Medication Sig Dispense Refill  . acetaminophen (TYLENOL) 500 MG tablet Take 500-1,000 mg every 8 (eight) hours as needed by mouth for mild pain (  depends on pain if takes 12 tablets).     Marland Kitchen albuterol (PROAIR HFA) 108 (90 Base) MCG/ACT inhaler Inhale 2 puffs into the lungs every 4 (four) hours as needed for wheezing or shortness of breath. 1 Inhaler 3  . aluminum-magnesium hydroxide-simethicone (MAALOX) 200-200-20 MG/5ML SUSP Take 30 mLs by mouth 2 (two) times daily as needed.    . Artificial Tear Solution (SOOTHE XP OP) Apply 2 drops at bedtime to eye.    . Biotin 69629 MCG TABS Take 1 tablet by mouth daily.    . Cholecalciferol (VITAMIN D3) 5000 units CAPS Take 5,000 Units daily by mouth.     . doxepin (SINEQUAN) 10 MG capsule Take 40-60 mg at bedtime by mouth. Depends on IBS symptoms and migraines if takes 4-6    . frovatriptan (FROVA) 2.5 MG tablet Take 2.5 mg as needed by mouth for migraine (take 1 tablet at onset of migraine and repeat up to twice if needed). If recurs, may repeat after 2 hours. Max of 3 tabs in 24 hours.     Marland Kitchen loperamide (IMODIUM) 2 MG capsule Take 2-4 mg as needed by mouth for diarrhea or loose stools.    Marland Kitchen loratadine (CLARITIN) 10 MG tablet Take 10 mg daily by mouth.     . losartan (COZAAR) 25 MG tablet Take 25 mg by mouth 2 (two) times daily.     . Multiple Vitamin (MULTIVITAMIN) tablet Take 1 tablet every other day by mouth.     . simethicone (MYLICON) 80 MG chewable tablet Chew 80 mg every 6 (six) hours as needed by mouth for flatulence.    . sodium chloride (OCEAN) 0.65 % SOLN nasal spray Place 1 spray as needed into both nostrils for congestion.    Marland Kitchen pyridOXINE (VITAMIN B-6) 100 MG tablet Take by mouth.    . RABEprazole (ACIPHEX) 20 MG tablet Take 20 mg by mouth daily as needed.      No facility-administered medications prior to visit.      Objective:  GEN: middle aged woman in NAD HEENT: MMM, no thrush CV: RRR, ext warm  PULM: Clear no accessory muscle use GI: Soft, +BS EXT: No edema NEURO: Moves all 4 ext to command PSYCH: AOx3, good insight SKIN: No rashes    Vitals:   05/05/19 1540  BP: 110/74  Pulse: 79  Temp: 98.4 F (36.9 C)  TempSrc: Temporal  SpO2: 100%  Weight: 160 lb (72.6 kg)  Height: 5\' 8"  (1.727 m)   100% on RA BMI Readings from Last 3 Encounters:  05/05/19 24.33 kg/m  01/19/19 23.92 kg/m  05/05/18 23.51 kg/m   Wt Readings from Last 3 Encounters:  05/05/19 160 lb (72.6 kg)  01/19/19 162 lb (73.5 kg)  05/05/18 159 lb 3.2 oz (72.2 kg)     CBC    Component Value Date/Time   WBC 12.0 (H) 05/30/2017 0230   RBC 4.28 05/30/2017 0230   HGB 11.8 (L) 05/30/2017 0230   HCT 37.2 05/30/2017 0230   PLT 196 05/30/2017 0230   MCV 86.9 05/30/2017 0230   MCH 27.6 05/30/2017 0230   MCHC 31.7 05/30/2017 0230   RDW 13.6 05/30/2017 0230   LYMPHSABS 1.2 01/20/2009 1530   MONOABS 0.4 01/20/2009 1530   EOSABS 0.0 01/20/2009 1530   BASOSABS 0.0 01/20/2009 1530   Chest imaging: 04/2017 CT chest images independently reviewed showing cystic appearing changes and a bronchovascular distribution in the upper lobes, some in the lower lobes, otherwise no fibrotic change, mediastinal lymphadenopathy  noted  PFT: November 2018: Ratio 90%, FVC 3.03 L 79% predicted, total lung capacity 5.75 L 99% predicted, DLCO 21.35 68% predicted April 2019 forced vital capacity 3.14 L 75% predicted, DLCO 22.371% predicted  6MW: 09/2017 408 m (97% pred)  Labs:  Path: November 2018 mediastinal and hilar lymph node excisional biopsy: No malignancy, noncaseating granulomas seen, special stains negative for organisms     A1AT Assessment & Plan:   # Cystic lung disease- distribution looks c/w LAM, suppose it could be from insults sustained with premature birth and mechanical ventilation; stable on limited folllowup that I can see.  She has some skin and ocular issues that make me wonder about tuberous  sclerosis # Presumed sarcoidosis asymptomatic  Discussion: -Monitor with yearly spirometry - Eye exams with opthalmology -Imaging only if change in symptoms or spirometry -Would check serum VEGF, I will call her with results - f/u in 12 months or sooner if doing unwell   Current Outpatient Medications:  .  acetaminophen (TYLENOL) 500 MG tablet, Take 500-1,000 mg every 8 (eight) hours as needed by mouth for mild pain (depends on pain if takes 12 tablets). , Disp: , Rfl:  .  albuterol (PROAIR HFA) 108 (90 Base) MCG/ACT inhaler, Inhale 2 puffs into the lungs every 4 (four) hours as needed for wheezing or shortness of breath., Disp: 1 Inhaler, Rfl: 3 .  aluminum-magnesium hydroxide-simethicone (MAALOX) 200-200-20 MG/5ML SUSP, Take 30 mLs by mouth 2 (two) times daily as needed., Disp: , Rfl:  .  Artificial Tear Solution (SOOTHE XP OP), Apply 2 drops at bedtime to eye., Disp: , Rfl:  .  Biotin 1610910000 MCG TABS, Take 1 tablet by mouth daily., Disp: , Rfl:  .  Cholecalciferol (VITAMIN D3) 5000 units CAPS, Take 5,000 Units daily by mouth. , Disp: , Rfl:  .  doxepin (SINEQUAN) 10 MG capsule, Take 40-60 mg at bedtime by mouth. Depends on IBS symptoms and migraines if takes 4-6, Disp: , Rfl:  .  frovatriptan (FROVA) 2.5 MG tablet, Take 2.5 mg as needed by mouth for migraine (take 1 tablet at onset of migraine and repeat up to twice if needed). If recurs, may repeat after 2 hours. Max of 3 tabs in 24 hours. , Disp: , Rfl:  .  loperamide (IMODIUM) 2 MG capsule, Take 2-4 mg as needed by mouth for diarrhea or loose stools., Disp: , Rfl:  .  loratadine (CLARITIN) 10 MG tablet, Take 10 mg daily by mouth. , Disp: , Rfl:  .  losartan (COZAAR) 25 MG tablet, Take 25 mg by mouth 2 (two) times daily. , Disp: , Rfl:  .  Multiple Vitamin (MULTIVITAMIN) tablet, Take 1 tablet every other day by mouth. , Disp: , Rfl:  .  simethicone (MYLICON) 80 MG chewable tablet, Chew 80 mg every 6 (six) hours as needed by mouth for  flatulence., Disp: , Rfl:  .  sodium chloride (OCEAN) 0.65 % SOLN nasal spray, Place 1 spray as needed into both nostrils for congestion., Disp: , Rfl:  .  pyridOXINE (VITAMIN B-6) 100 MG tablet, Take by mouth., Disp: , Rfl:  .  RABEprazole (ACIPHEX) 20 MG tablet, Take 20 mg by mouth daily as needed. , Disp: , Rfl:    Lorin Glassaniel C Catalyna Reilly, MD [facility] 05/06/2019 12:16 PM

## 2019-05-05 NOTE — Patient Instructions (Addendum)
We will schedule you for a spirometry. You will have to be tested for Covid prior to having this done.   Labs today.   We will get you your Pneumonia vaccine today.   We will call you with the results of your Labs and Spirometry.   We will see you back in 1 year.

## 2019-05-09 LAB — VEGF, SERUM: VEGF, Serum: 945 pg/mL — ABNORMAL HIGH (ref 62–707)

## 2019-05-09 NOTE — Progress Notes (Signed)
Can we get this patient a CT of the chest and appointment with me next time I have clinic.  Let her know it's nothing dangerous  I just need to discuss her lab test in more detail.  Thank you

## 2019-05-12 ENCOUNTER — Telehealth: Payer: Self-pay | Admitting: Internal Medicine

## 2019-05-15 ENCOUNTER — Telehealth: Payer: Self-pay | Admitting: Internal Medicine

## 2019-05-15 DIAGNOSIS — J849 Interstitial pulmonary disease, unspecified: Secondary | ICD-10-CM

## 2019-05-15 NOTE — Telephone Encounter (Signed)
Patient called back - unable to due spiro on 05/19/2019 - pt needs after Thanksgiving about 4pm -pr

## 2019-05-15 NOTE — Telephone Encounter (Signed)
Called patient no answer, can we do a tele visit with me first week December so we don't play phone tag.

## 2019-05-15 NOTE — Telephone Encounter (Signed)
Call returned to patient, confirmed DOB, made aware of results per DS:  Notes recorded by Candee Furbish, MD on 05/09/2019 at 7:38 AM EST  Can we get this patient a CT of the chest and appointment with me next time I have clinic. Let her know it's nothing dangerous I just need to discuss her lab test in more detail. Thank you.  She is requesting that she speak with Dr. Tamala Julian first to discuss in more detail.   She states she can be reached at either number. Mobile [phone] or Work [phone]. Prefers that he call her work number first.   Will route message to Dr. Tamala Julian.

## 2019-05-16 NOTE — Telephone Encounter (Signed)
Attempted to call Kristin [name] but unable to reach. Left message for Kristin [name] to return call.   When Kristin [name] calls back, please schedule her a televisit with Dr. Tamala Julian first week in December per his request. Thanks!

## 2019-05-17 NOTE — Telephone Encounter (Signed)
LMTCB x2 for pt 

## 2019-05-18 NOTE — Telephone Encounter (Signed)
LMTCB x3 for pt. We have attempted to contact pt several times with no success or call back from pt. Per triage protocol, message will be closed.   

## 2019-05-29 NOTE — Telephone Encounter (Signed)
Noted. Will leave message for [name] as we do not know what the Spiro/Feno schedule will be at this time due Covid.

## 2019-06-02 ENCOUNTER — Other Ambulatory Visit: Payer: Self-pay

## 2019-06-02 ENCOUNTER — Ambulatory Visit (INDEPENDENT_AMBULATORY_CARE_PROVIDER_SITE_OTHER): Payer: 59 | Admitting: Internal Medicine

## 2019-06-02 ENCOUNTER — Encounter: Payer: Self-pay | Admitting: Internal Medicine

## 2019-06-02 DIAGNOSIS — R899 Unspecified abnormal finding in specimens from other organs, systems and tissues: Secondary | ICD-10-CM | POA: Diagnosis not present

## 2019-06-02 MED ORDER — ALBUTEROL SULFATE HFA 108 (90 BASE) MCG/ACT IN AERS: 2.0000 | INHALATION_SPRAY | RESPIRATORY_TRACT | 5 refills | Status: AC | PRN

## 2019-06-02 NOTE — Progress Notes (Signed)
Done via virtual visit due to COVID pandemic.    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   HPI History as below.  Here to discuss results of VEGF test which were elevated.  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  She also has skin and ocular issues which raise question of tuberous sclerosis.  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  Former patient of [facility]. -Sarcoidosis diagnosed after abnormal CXR leading to CT leading to PET leading to mediastinoscopy in 2018 with noncaseating granulomas on biopsy. Issues with recurrent laryngitis. 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 -R thyroid nodule stable in size and PET neg  ROS + symptoms in bold Fevers, chills, weight loss Nausea, vomiting, diarrhea Shortness of breath, wheezing, cough Chest pain, palpitations, lower ext edema   Past Medical History:  Diagnosis Date   Anxiety    Dizziness    Dysrhythmia    palpitations   Fibromyalgia 04/13/2017   GERD (gastroesophageal reflux disease)    Headache    History of migraine headaches    Hypertension    hx arrhythmia   IBS (irritable bowel syndrome)    Lymphadenopathy    Seasonal allergies    Thyroid nodule    Vertigo    Vitamin D deficiency      Family History  Problem Relation Age of Onset   Hypertension Mother    Diabetes Mother    Hyperthyroidism Mother    Hypertension Father    Heart disease Father    Hypothyroidism Father    Cardiomyopathy Father    Other Father 85       CAUSE OF DEATH..CEREBRAL HEMORR   Cardiomyopathy Paternal Uncle    Heart disease Paternal Uncle        CABG   Diabetes Maternal Grandmother    Hypertension Maternal Grandmother    Heart disease Maternal Grandmother        MI    Other Maternal Grandmother        GRAVES DISEASE   Cardiomyopathy Maternal Grandmother    Other Maternal Grandfather        AAA   Cardiomyopathy Maternal Grandfather    Heart disease Maternal Grandfather        MI   Cancer Paternal Grandmother        BREAST, OTHER UNSPECIFIED SITE   Hypertension Paternal Grandfather    Stroke Paternal Grandfather        ISCHEMIC STROKE   Other Maternal Aunt        THYROID DISEASE   Heart disease Paternal Aunt        CABG   Cardiomyopathy Paternal Aunt    Cancer Paternal Aunt        STOMACH   Other Cousin        THYROID    Breast cancer Neg Hx      Past Surgical History:  Procedure Laterality Date   DILATION AND CURETTAGE OF UTERUS  2006, 2009   EYE SURGERY Bilateral 1997   LASER   THYOID NODULE  05/2009   [facility]NA...DR. Richardson Landry .Marland KitchenMarland KitchenMarland KitchenNEG   VAGINAL HYSTERECTOMY  12/2017   LAPAROSCOPICALLY ASSISTED/DR. ELIZABETH SKINNER   VIDEO ASSISTED THORACOSCOPY (VATS)/ LYMPH NODE SAMPLING Left 05/28/2017   Procedure: LEFT PERISTERNAL EXPLORATION, LEFT VIDEO ASSISTED THORACOSCOPY (VATS), MEDIASTINAL AND AP WINDOW LYMPH NODE DISSECTION;  Surgeon: Delight OvensGerhardt, Edward B, MD;  [facility];  Service: Thoracic;  Laterality: Left;   VIDEO BRONCHOSCOPY WITH ENDOBRONCHIAL ULTRASOUND N/A 05/17/2017   Procedure: VIDEO BRONCHOSCOPY WITH ENDOBRONCHIAL ULTRASOUND WITH TRANSBRONCHIAL BX OF LYMPH NODES 7, 10 R AND 10 L.;  Surgeon: Delight OvensGerhardt, Edward B, MD;  Location: [facility] OR;  Service: Thoracic;  Laterality: N/A;    Social History   Socioeconomic History   Marital status: Single    Spouse name: Not on file   Number of children: Not on file   Years of education: Not on file   Highest education level: Not on file  Occupational History   Not on file  Social Needs   Financial resource strain: Not on file   Food insecurity    Worry: Not on file    Inability: Not on file   Transportation needs    Medical: Not on file    Non-medical: Not on  file  Tobacco Use   Smoking status: Never Smoker   Smokeless tobacco: Never Used  Substance and Sexual Activity   Alcohol use: No   Drug use: No   Sexual activity: Never    Partners: Male  Lifestyle   Physical activity    Days per week: Not on file    Minutes per session: Not on file   Stress: Not on file  Relationships   Social connections    Talks on phone: Not on file    Gets together: Not on file    Attends religious service: Not on file    Active member of club or organization: Not on file    Attends meetings of clubs or organizations: Not on file    Relationship status: Not on file   Intimate partner violence    Fear of current or ex partner: Not on file    Emotionally abused: Not on file    Physically abused: Not on file    Forced sexual activity: Not on file  Other Topics Concern   Not on file  Social History Narrative   Not on file     Allergies  Allergen Reactions   Amoxicillin Hives    Has patient had a PCN reaction causing immediate rash, facial/tongue/throat swelling, SOB or lightheadedness with hypotension: UNKNOWN  Has patient had a PCN reaction causing severe rash involving mucus membranes or skin necrosis:  #  #  #  NO  #  #  #  Has patient had a PCN reaction that required hospitalization  #  #  #  NO  #  #  #  Has patient had a PCN reaction occurring within the last 10 years: #  #  #  YES  #  #  #  If all "NO's", may proceed with Cephalosporin use.   Doxycycline Hives   Indocin [Indomethacin] Hives   Levaquin [Levofloxacin] Hives   Probiotic [Acidophilus] Hives   Bentyl [Dicyclomine Hcl] Diarrhea   Erythromycin Nausea Only    STOMACH CRAMPS   Keflex [Cephalexin] Rash   Lansoprazole Other (See Comments)    HEADACHE   Nexium [Esomeprazole] Hives and Other (See Comments)    INTOLERANCE >  HEADACHE    Sudafed [Pseudoephedrine Hcl] Other (See Comments)    TACHYCARDIA   Sulfamethoxazole Rash   Tetanus Toxoids Other (See  Comments)    LOCAL REACTION...REDNESS/KNOT     Outpatient Medications Prior to Visit  Medication Sig Dispense Refill   acetaminophen (TYLENOL) 500 MG tablet Take 500-1,000 mg every 8 (eight)  hours as needed by mouth for mild pain (depends on pain if takes 12 tablets).      albuterol (PROAIR HFA) 108 (90 Base) MCG/ACT inhaler Inhale 2 puffs into the lungs every 4 (four) hours as needed for wheezing or shortness of breath. 1 Inhaler 3   aluminum-magnesium hydroxide-simethicone (MAALOX) 660-630-16 MG/5ML SUSP Take 30 mLs by mouth 2 (two) times daily as needed.     Artificial Tear Solution (SOOTHE XP OP) Apply 2 drops at bedtime to eye.     Biotin 10000 MCG TABS Take 1 tablet by mouth daily.     Cholecalciferol (VITAMIN D3) 5000 units CAPS Take 5,000 Units daily by mouth.      doxepin (SINEQUAN) 10 MG capsule Take 40-60 mg at bedtime by mouth. Depends on IBS symptoms and migraines if takes 4-6     frovatriptan (FROVA) 2.5 MG tablet Take 2.5 mg as needed by mouth for migraine (take 1 tablet at onset of migraine and repeat up to twice if needed). If recurs, may repeat after 2 hours. Max of 3 tabs in 24 hours.      loperamide (IMODIUM) 2 MG capsule Take 2-4 mg as needed by mouth for diarrhea or loose stools.     loratadine (CLARITIN) 10 MG tablet Take 10 mg daily by mouth.      losartan (COZAAR) 25 MG tablet Take 25 mg by mouth 2 (two) times daily.      Multiple Vitamin (MULTIVITAMIN) tablet Take 1 tablet every other day by mouth.      pyridOXINE (VITAMIN B-6) 100 MG tablet Take by mouth.     RABEprazole (ACIPHEX) 20 MG tablet Take 20 mg by mouth daily as needed.      simethicone (MYLICON) 80 MG chewable tablet Chew 80 mg every 6 (six) hours as needed by mouth for flatulence.     sodium chloride (OCEAN) 0.65 % SOLN nasal spray Place 1 spray as needed into both nostrils for congestion.     No facility-administered medications prior to visit.      Objective:  Anxious on  phone Speaking in full sentences  There were no vitals filed for this visit.   on RA BMI Readings from Last 3 Encounters:  05/05/19 24.33 kg/m  01/19/19 23.92 kg/m  05/05/18 23.51 kg/m   Wt Readings from Last 3 Encounters:  05/05/19 160 lb (72.6 kg)  01/19/19 162 lb (73.5 kg)  05/05/18 159 lb 3.2 oz (72.2 kg)     CBC    Component Value Date/Time   WBC 12.0 (H) 05/30/2017 0230   RBC 4.28 05/30/2017 0230   HGB 11.8 (L) 05/30/2017 0230   HCT 37.2 05/30/2017 0230   PLT 196 05/30/2017 0230   MCV 86.9 05/30/2017 0230   MCH 27.6 05/30/2017 0230   MCHC 31.7 05/30/2017 0230   RDW 13.6 05/30/2017 0230   LYMPHSABS 1.2 01/20/2009 1530   MONOABS 0.4 01/20/2009 1530   EOSABS 0.0 01/20/2009 1530   BASOSABS 0.0 01/20/2009 1530   Chest imaging: 04/2017 CT chest images independently reviewed showing cystic appearing changes and a bronchovascular distribution in the upper lobes, some in the lower lobes, otherwise no fibrotic change, mediastinal lymphadenopathy noted  PFT: November 2018: Ratio 90%, FVC 3.03 L 79% predicted, total lung capacity 5.75 L 99% predicted, DLCO 21.35 68% predicted April 2019 forced vital capacity 3.14 L 75% predicted, DLCO 22.371% predicted  6MW: 09/2017 408 m (97% pred)  Labs:  Path: November 2018 mediastinal and hilar lymph node excisional biopsy:  No malignancy, noncaseating granulomas seen, special stains negative for organisms     A1AT WNL VEGF 945 pg/mL  Assessment & Plan:   # 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  Studies involving sirolimus are reserved for patients who already have lung impairment, which I am not seeing in this patient.     Discussion: - Patient is reluctant for further testing including TSC genetics.  She wants me to discuss with Dr. Henrene Pastor which I will do and reach back out to her.   Current Outpatient Medications:    acetaminophen (TYLENOL) 500 MG tablet, Take 500-1,000 mg every 8 (eight)  hours as needed by mouth for mild pain (depends on pain if takes 12 tablets). , Disp: , Rfl:    albuterol (PROAIR HFA) 108 (90 Base) MCG/ACT inhaler, Inhale 2 puffs into the lungs every 4 (four) hours as needed for wheezing or shortness of breath., Disp: 1 Inhaler, Rfl: 3   aluminum-magnesium hydroxide-simethicone (MAALOX) 200-200-20 MG/5ML SUSP, Take 30 mLs by mouth 2 (two) times daily as needed., Disp: , Rfl:    Artificial Tear Solution (SOOTHE XP OP), Apply 2 drops at bedtime to eye., Disp: , Rfl:    Biotin 75643 MCG TABS, Take 1 tablet by mouth daily., Disp: , Rfl:    Cholecalciferol (VITAMIN D3) 5000 units CAPS, Take 5,000 Units daily by mouth. , Disp: , Rfl:    doxepin (SINEQUAN) 10 MG capsule, Take 40-60 mg at bedtime by mouth. Depends on IBS symptoms and migraines if takes 4-6, Disp: , Rfl:    frovatriptan (FROVA) 2.5 MG tablet, Take 2.5 mg as needed by mouth for migraine (take 1 tablet at onset of migraine and repeat up to twice if needed). If recurs, may repeat after 2 hours. Max of 3 tabs in 24 hours. , Disp: , Rfl:    loperamide (IMODIUM) 2 MG capsule, Take 2-4 mg as needed by mouth for diarrhea or loose stools., Disp: , Rfl:    loratadine (CLARITIN) 10 MG tablet, Take 10 mg daily by mouth. , Disp: , Rfl:    losartan (COZAAR) 25 MG tablet, Take 25 mg by mouth 2 (two) times daily. , Disp: , Rfl:    Multiple Vitamin (MULTIVITAMIN) tablet, Take 1 tablet every other day by mouth. , Disp: , Rfl:    pyridOXINE (VITAMIN B-6) 100 MG tablet, Take by mouth., Disp: , Rfl:    RABEprazole (ACIPHEX) 20 MG tablet, Take 20 mg by mouth daily as needed. , Disp: , Rfl:    simethicone (MYLICON) 80 MG chewable tablet, Chew 80 mg every 6 (six) hours as needed by mouth for flatulence., Disp: , Rfl:    sodium chloride (OCEAN) 0.65 % SOLN nasal spray, Place 1 spray as needed into both nostrils for congestion., Disp: , Rfl:    Lorin Glass, MD [facility] 06/02/2019 8:02 AM

## 2019-06-06 ENCOUNTER — Telehealth: Payer: Self-pay | Admitting: Internal Medicine

## 2019-06-06 DIAGNOSIS — J849 Interstitial pulmonary disease, unspecified: Secondary | ICD-10-CM

## 2019-06-06 NOTE — Telephone Encounter (Signed)
Forwarding to Dr. Tamala Julian as Juluis Rainier. Please advise when you've spoken to patient.  Thanks!

## 2019-06-06 NOTE — Telephone Encounter (Signed)
Spoke to patient at length. Can you reach out to patient to get full PFTs done.  Thank you!

## 2019-06-07 NOTE — Telephone Encounter (Signed)
LMTCB

## 2019-06-08 NOTE — Telephone Encounter (Signed)
According to phone note dated 06/06/19- pt needs full pft  We have left msg to call back for this  Will go ahead and close this encounter

## 2019-06-08 NOTE — Telephone Encounter (Signed)
LMTCB

## 2019-06-09 ENCOUNTER — Telehealth: Payer: Self-pay | Admitting: Pulmonary Disease

## 2019-06-09 NOTE — Telephone Encounter (Signed)
Left message for patient to call back  

## 2019-06-09 NOTE — Telephone Encounter (Signed)
Tried calling the patient per her request to discuss lab result on her mobile phone.  No answer, no voicemail option.

## 2019-06-09 NOTE — Telephone Encounter (Signed)
Pt scheduled for PFT on 2/5.  The order that is epic didn't give me the correct option when scheduling the PFT for visit type.  Does it need to be fixed?  See active order

## 2019-06-09 NOTE — Telephone Encounter (Signed)
Will place the correct order and attach it to the appt on 2/5.   Will close this encounter.

## 2019-08-01 ENCOUNTER — Other Ambulatory Visit (HOSPITAL_COMMUNITY)
Admission: RE | Admit: 2019-08-01 | Discharge: 2019-08-01 | Disposition: A | Discharge status: Home / Self Care | Payer: 59 | Source: Ambulatory Visit | Attending: Internal Medicine | Admitting: Internal Medicine

## 2019-08-01 DIAGNOSIS — Z01812 Encounter for preprocedural laboratory examination: Secondary | ICD-10-CM | POA: Insufficient documentation

## 2019-08-01 DIAGNOSIS — Z20822 Contact with and (suspected) exposure to covid-19: Secondary | ICD-10-CM | POA: Insufficient documentation

## 2019-08-01 LAB — SARS CORONAVIRUS 2 (TAT 6-24 HRS): SARS Coronavirus 2: NEGATIVE

## 2019-08-04 ENCOUNTER — Other Ambulatory Visit: Payer: Self-pay

## 2019-08-04 ENCOUNTER — Ambulatory Visit (INDEPENDENT_AMBULATORY_CARE_PROVIDER_SITE_OTHER): Payer: 59 | Admitting: Internal Medicine

## 2019-08-04 DIAGNOSIS — J849 Interstitial pulmonary disease, unspecified: Secondary | ICD-10-CM | POA: Diagnosis not present

## 2019-08-04 LAB — PULMONARY FUNCTION TEST
DL/VA % pred: 114 %
DL/VA: 4.73 ml/min/mmHg/L
DLCO unc % pred: 99 %
DLCO unc: 24.42 ml/min/mmHg
FEF 25-75 Post: 1.66 L/sec
FEF 25-75 Pre: 1.69 L/sec
FEF2575-%Change-Post: -1 %
FEF2575-%Pred-Post: 55 %
FEF2575-%Pred-Pre: 56 %
FEV1-%Change-Post: 0 %
FEV1-%Pred-Post: 71 %
FEV1-%Pred-Pre: 71 %
FEV1-Post: 2.34 L
FEV1-Pre: 2.33 L
FEV1FVC-%Change-Post: 3 %
FEV1FVC-%Pred-Pre: 90 %
FEV6-%Change-Post: -3 %
FEV6-%Pred-Post: 77 %
FEV6-%Pred-Pre: 80 %
FEV6-Post: 3.12 L
FEV6-Pre: 3.24 L
FEV6FVC-%Change-Post: 0 %
FEV6FVC-%Pred-Post: 102 %
FEV6FVC-%Pred-Pre: 102 %
FVC-%Change-Post: -3 %
FVC-%Pred-Post: 75 %
FVC-%Pred-Pre: 78 %
FVC-Post: 3.14 L
FVC-Pre: 3.25 L
Post FEV1/FVC ratio: 74 %
Post FEV6/FVC ratio: 100 %
Pre FEV1/FVC ratio: 72 %
Pre FEV6/FVC Ratio: 100 %
RV % pred: 107 %
RV: 2.24 L
TLC % pred: 95 %
TLC: 5.54 L

## 2019-08-04 NOTE — Progress Notes (Signed)
Full PFT performed today. °

## 2019-08-23 ENCOUNTER — Telehealth: Payer: Self-pay | Admitting: Internal Medicine

## 2019-08-23 NOTE — Telephone Encounter (Signed)
Called and spoke with Patient.  Patient requested PFT results from 08/04/19.   Patient stated Dr. Katrinka Blazing has called her and discussed things with her in 05/2019, and he had said to reach out to him with any concerns. Patient stated she has several questions to talk with Dr. Katrinka Blazing.  Patient stated she is normally home at 5:30pm.   Patient requested [phone] (home) for Dr. Katrinka Blazing to call.  Message routed to Dr. Katrinka Blazing

## 2019-08-25 NOTE — Telephone Encounter (Signed)
Dr. Katrinka Blazing please advise if you've spoken to pt.  Thanks!

## 2019-08-26 NOTE — Telephone Encounter (Signed)
Called and updated at length, thanks

## 2019-08-28 ENCOUNTER — Telehealth: Payer: Self-pay

## 2019-08-28 NOTE — Telephone Encounter (Signed)
Noted Will sign off 

## 2019-08-28 NOTE — Telephone Encounter (Signed)
Received the below staff message from Dr. Katrinka Blazing. Forwarding to Liz/team leads to see if this request is something that our office would be able to do, and to follow up on the matter since I'll not be in the office for the rest of the week.

## 2019-08-28 NOTE — Telephone Encounter (Signed)
-----   Message from Lorin Glass, MD sent at 08/26/2019  3:43 PM EST ----- Regarding: Insurance Coverage Can someone check how much this test below would cost patient if (A) its covered (would need prior auth) or (B) is out of pocket.  [facility]: Epilepsy Gene Panel TEST: [id number]  Once you find out please let patient know and ask if she wants it drawn.  If she wants to discuss further Dr. Kendrick Fries is available as a second opinion.  Jesusita Oka

## 2019-09-05 NOTE — Telephone Encounter (Signed)
Liz, please advise. Thanks 

## 2019-09-07 NOTE — Telephone Encounter (Signed)
Sorry guys this is a lab test I truly dont know anything about this not sure where to begin Tobe Sos

## 2019-09-25 NOTE — Telephone Encounter (Signed)
Robynn Pane,  Would you check with Irma to see if she knows what the CPT for that test would be?  I am not sure where to start with this.  Marisue Ivan

## 2019-09-25 NOTE — Telephone Encounter (Signed)
Kristin [name] is unaware of what the CPT code would be for the previously stated lab order.

## 2019-10-10 NOTE — Telephone Encounter (Signed)
I don't really have any way of figuring out the coverage without a CPT code.  I don't  know where else to go with this.

## 2019-10-11 NOTE — Telephone Encounter (Signed)
Kristin [name],  Would you check to see if the CPT codes below will need prior auth?  Thanks!

## 2019-10-11 NOTE — Telephone Encounter (Signed)
Called [facility] and spoke with a rep and was given the follow CPT codes for this test: 81189, 81403 x 2, 81404 x 5, 81405 x 3, 81406 x 11, 81407 x 2, 12162  Marisue Ivan, please advise. Thanks.

## 2019-10-11 NOTE — Telephone Encounter (Signed)
Sorry I don't know anything about precerting labs I only do procedures Tobe Sos

## 2019-10-27 ENCOUNTER — Telehealth: Payer: Self-pay | Admitting: Internal Medicine

## 2019-10-27 NOTE — Telephone Encounter (Signed)
Please refer to phone encounter from 3/1 as this message is in regards to that open encounter.

## 2019-10-27 NOTE — Telephone Encounter (Signed)
Message from another open phone encounter posted below from pt: [name], Kristin H to Darrel Reach    10/27/19 1:15 PM pt calling to followup-- was told in february by Dr. Katrinka Blazing that some testing was necessary-- was waiting to hear back from our office if her insurance would cover it or not-- still hasn't heard anything , please advise - -detailed message ok   Kristin [name], please advise. Thanks!

## 2019-11-07 NOTE — Telephone Encounter (Signed)
I have called and spoke to [facility] she told me that the provider would have to call [phone] to precert the labs and pt has 20% coinsurance/ 5000. Deductible/ 7000. Out of pocket. She was unable to give me the price of the out of pocket the patient will need to call billing to get the price for the charges [phone].

## 2019-11-07 NOTE — Telephone Encounter (Signed)
Im on the phone with [facility]

## 2019-11-17 ENCOUNTER — Encounter (INDEPENDENT_AMBULATORY_CARE_PROVIDER_SITE_OTHER): Payer: 59 | Admitting: Ophthalmology

## 2019-11-17 ENCOUNTER — Other Ambulatory Visit: Payer: Self-pay

## 2019-11-17 DIAGNOSIS — H35033 Hypertensive retinopathy, bilateral: Secondary | ICD-10-CM | POA: Diagnosis not present

## 2019-11-17 DIAGNOSIS — H43813 Vitreous degeneration, bilateral: Secondary | ICD-10-CM | POA: Diagnosis not present

## 2019-11-17 DIAGNOSIS — H33303 Unspecified retinal break, bilateral: Secondary | ICD-10-CM

## 2019-11-17 DIAGNOSIS — I10 Essential (primary) hypertension: Secondary | ICD-10-CM

## 2019-12-02 NOTE — Telephone Encounter (Signed)
done

## 2020-01-15 ENCOUNTER — Telehealth: Payer: Self-pay | Admitting: Internal Medicine

## 2020-01-15 DIAGNOSIS — J849 Interstitial pulmonary disease, unspecified: Secondary | ICD-10-CM

## 2020-01-15 DIAGNOSIS — R221 Localized swelling, mass and lump, neck: Secondary | ICD-10-CM

## 2020-01-15 NOTE — Telephone Encounter (Signed)
Patient phone number is [phone] w. May leave detailed message.

## 2020-01-15 NOTE — Telephone Encounter (Signed)
With Dr. Katrinka Blazing now working primarily in the hospital, we need to figure out a different provider for pt to see.  Dr. Katrinka Blazing, please advise if you have a provider preference for [place] to get pt scheduled with.

## 2020-01-16 NOTE — Telephone Encounter (Signed)
Called patient, addressed concerns, i'll let you know f/u after scans done.

## 2020-01-16 NOTE — Telephone Encounter (Signed)
Called patient and went over symptoms. Worsening neck swelling for past two weeks. Known large R thyroid nodule being followed by endocrine. 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 Discussed that easiest thing to do would be get CT neck and chest then go over results to see if there is anything to worry about. I will take care of this then arrange for her f/u in clinic with another provider.  Myrla Halsted MD [facility]

## 2020-01-26 ENCOUNTER — Ambulatory Visit (INDEPENDENT_AMBULATORY_CARE_PROVIDER_SITE_OTHER)
Admission: RE | Admit: 2020-01-26 | Discharge: 2020-01-26 | Disposition: A | Discharge status: Home / Self Care | Payer: 59 | Source: Ambulatory Visit | Attending: Internal Medicine | Admitting: Internal Medicine

## 2020-01-26 ENCOUNTER — Other Ambulatory Visit: Payer: Self-pay

## 2020-01-26 DIAGNOSIS — J849 Interstitial pulmonary disease, unspecified: Secondary | ICD-10-CM

## 2020-01-26 DIAGNOSIS — R221 Localized swelling, mass and lump, neck: Secondary | ICD-10-CM | POA: Diagnosis not present

## 2020-02-08 ENCOUNTER — Other Ambulatory Visit: Payer: Self-pay | Admitting: Obstetrics and Gynecology

## 2020-02-08 DIAGNOSIS — Z1231 Encounter for screening mammogram for malignant neoplasm of breast: Secondary | ICD-10-CM

## 2020-03-15 ENCOUNTER — Other Ambulatory Visit: Payer: Self-pay

## 2020-03-15 ENCOUNTER — Ambulatory Visit
Admission: RE | Admit: 2020-03-15 | Discharge: 2020-03-15 | Disposition: A | Discharge status: Home / Self Care | Payer: 59 | Source: Ambulatory Visit | Attending: Obstetrics and Gynecology | Admitting: Obstetrics and Gynecology

## 2020-03-15 DIAGNOSIS — Z1231 Encounter for screening mammogram for malignant neoplasm of breast: Secondary | ICD-10-CM

## 2020-04-08 ENCOUNTER — Telehealth: Payer: Self-pay | Admitting: Neurology

## 2020-04-08 NOTE — Telephone Encounter (Signed)
I contacted the pt and left a vm asking for a call back.  Pt was last seen over a year ago. Pt would need f/u to discuss her sx. Please offer once pt calls back.

## 2020-04-08 NOTE — Telephone Encounter (Signed)
Pt called and is needing to speak to the RN about a heavy feeling in her head that she is feeling on the R side. Please advise.

## 2020-04-11 ENCOUNTER — Encounter: Payer: Self-pay | Admitting: Neurology

## 2020-04-11 ENCOUNTER — Other Ambulatory Visit: Payer: Self-pay

## 2020-04-11 ENCOUNTER — Ambulatory Visit: Payer: BC Managed Care – PPO | Admitting: Neurology

## 2020-04-11 VITALS — BP 135/95 | HR 89 | Ht 69.0 in | Wt 161.0 lb

## 2020-04-11 DIAGNOSIS — M797 Fibromyalgia: Secondary | ICD-10-CM

## 2020-04-11 DIAGNOSIS — R42 Dizziness and giddiness: Secondary | ICD-10-CM | POA: Diagnosis not present

## 2020-04-11 DIAGNOSIS — Z8669 Personal history of other diseases of the nervous system and sense organs: Secondary | ICD-10-CM | POA: Diagnosis not present

## 2020-04-11 MED ORDER — TOPIRAMATE 25 MG PO TABS
ORAL_TABLET | ORAL | 3 refills | Status: DC
Start: 2020-04-11 — End: 2020-12-26

## 2020-04-11 NOTE — Progress Notes (Signed)
Reason for visit: Headache, paresthesias  Kristin [name] is an 54 y.o. female  History of present illness:  Kristin [name] is a 54 year old right-handed white female with a history of migraine headaches.  The patient has irritable bowel syndrome and fibromyalgia type symptoms.  There is some question whether she has sarcoidosis, but it appears that her pulmonologist now questions this diagnosis.  The patient has had a history of intermittent migratory paresthesias that have been present for greater than 10 years, she had MRI of the brain and cervical spine on her last visit over a year ago, the studies were ordered with and without contrast due to the history of sarcoidosis, but the patient refused the contrast agent.  The studies were normal.  The patient returns today with a 1 month history of a heavy feeling in the head on the right side, sometimes associated with lightheaded sensations.  The patient does have a prior history of vertigo off and on.  She claims that her migraine headaches usually occur on the left side of the head and spread to the frontal areas, may be severe once or twice a month where she has to take Frova which is helpful.  The patient has some form of headache almost every day however.  The patient denies any recent medication changes.  The patient does report some photophobia and phonophobia with the headache, she denies any nausea or vomiting.  She returns to the office today for an evaluation.  Past Medical History:  Diagnosis Date   Anxiety    Dizziness    Dysrhythmia    palpitations   Fibromyalgia 04/13/2017   GERD (gastroesophageal reflux disease)    Headache    History of migraine headaches    Hypertension    hx arrhythmia   IBS (irritable bowel syndrome)    Lymphadenopathy    Seasonal allergies    Thyroid nodule    Vertigo    Vitamin D deficiency     Past Surgical History:  Procedure Laterality Date   DILATION AND CURETTAGE OF UTERUS   2006, 2009   EYE SURGERY Bilateral 1997   LASER   THYOID NODULE  05/2009   FNA...DR. Richardson LandrySOLLENBERGER .Marland Kitchen.Marland Kitchen.Marland Kitchen.NEG   VAGINAL HYSTERECTOMY  12/2017   LAPAROSCOPICALLY ASSISTED/DR. ELIZABETH SKINNER   VIDEO ASSISTED THORACOSCOPY (VATS)/ LYMPH NODE SAMPLING Left 05/28/2017   Procedure: LEFT PERISTERNAL EXPLORATION, LEFT VIDEO ASSISTED THORACOSCOPY (VATS), MEDIASTINAL AND AP WINDOW LYMPH NODE DISSECTION;  Surgeon: Delight OvensGerhardt, Edward B, MD;  Location: [facility];  Service: Thoracic;  Laterality: Left;   VIDEO BRONCHOSCOPY WITH ENDOBRONCHIAL ULTRASOUND N/A 05/17/2017   Procedure: VIDEO BRONCHOSCOPY WITH ENDOBRONCHIAL ULTRASOUND WITH TRANSBRONCHIAL BX OF LYMPH NODES 7, 10 R AND 10 L.;  Surgeon: Delight OvensGerhardt, Edward B, MD;  Location: [facility] OR;  Service: Thoracic;  Laterality: N/A;    Family History  Problem Relation Age of Onset   Hypertension Mother    Diabetes Mother    Hyperthyroidism Mother    Hypertension Father    Heart disease Father    Hypothyroidism Father    Cardiomyopathy Father    Other Father [id number]       CAUSE OF DEATH..CEREBRAL HEMORR   Cardiomyopathy Paternal Uncle    Heart disease Paternal Uncle        CABG   Diabetes Maternal Grandmother    Hypertension Maternal Grandmother    Heart disease Maternal Grandmother        MI   Other Maternal Grandmother  GRAVES DISEASE   Cardiomyopathy Maternal Grandmother    Other Maternal Grandfather        AAA   Cardiomyopathy Maternal Grandfather    Heart disease Maternal Grandfather        MI   Cancer Paternal Grandmother        BREAST, OTHER UNSPECIFIED SITE   Breast cancer Paternal Grandmother    Hypertension Paternal Grandfather    Stroke Paternal Grandfather        ISCHEMIC STROKE   Other Maternal Aunt        THYROID DISEASE   Heart disease Paternal Aunt        CABG   Cardiomyopathy Paternal Aunt    Cancer Paternal Aunt        STOMACH   Other Cousin        THYROID    Breast cancer Cousin     Social  history:  reports that she has never smoked. She has never used smokeless tobacco. She reports that she does not drink alcohol and does not use drugs.    Allergies  Allergen Reactions   Amoxicillin Hives    Has patient had a PCN reaction causing immediate rash, facial/tongue/throat swelling, SOB or lightheadedness with hypotension: UNKNOWN  Has patient had a PCN reaction causing severe rash involving mucus membranes or skin necrosis:  #  #  #  NO  #  #  #  Has patient had a PCN reaction that required hospitalization  #  #  #  NO  #  #  #  Has patient had a PCN reaction occurring within the last 10 years: #  #  #  YES  #  #  #  If all "NO's", may proceed with Cephalosporin use.   Doxycycline Hives   Indocin [Indomethacin] Hives   Levaquin [Levofloxacin] Hives   Probiotic [Acidophilus] Hives   Bentyl [Dicyclomine Hcl] Diarrhea   Erythromycin Nausea Only    STOMACH CRAMPS   Keflex [Cephalexin] Rash   Lansoprazole Other (See Comments)    HEADACHE   Nexium [Esomeprazole] Hives and Other (See Comments)    INTOLERANCE >  HEADACHE    Sudafed [Pseudoephedrine Hcl] Other (See Comments)    TACHYCARDIA   Sulfamethoxazole Rash   Tetanus Toxoids Other (See Comments)    LOCAL REACTION...REDNESS/KNOT    Medications:  Prior to Admission medications   Medication Sig Start Date End Date Taking? Authorizing Provider  acetaminophen (TYLENOL) 500 MG tablet Take 500-1,000 mg every 8 (eight) hours as needed by mouth for mild pain (depends on pain if takes 12 tablets).     [provider]  albuterol (PROAIR HFA) 108 (90 Base) MCG/ACT inhaler Inhale 2 puffs into the lungs every 4 (four) hours as needed for wheezing or shortness of breath. 06/02/19 07/02/19  Lorin Glass, MD  aluminum-magnesium hydroxide-simethicone (MAALOX) 200-200-20 MG/5ML SUSP Take 30 mLs by mouth 2 (two) times daily as needed.    [provider]  Artificial Tear Solution (SOOTHE XP OP) Apply 2 drops at  bedtime to eye.    [provider]  Biotin 94854 MCG TABS Take 1 tablet by mouth daily.    [provider]  Cholecalciferol (VITAMIN D3) 5000 units CAPS Take 5,000 Units daily by mouth.     [provider]  doxepin (SINEQUAN) 10 MG capsule Take 40-60 mg at bedtime by mouth. Depends on IBS symptoms and migraines if takes 4-6    [provider]  frovatriptan (FROVA) 2.5 MG  tablet Take 2.5 mg as needed by mouth for migraine (take 1 tablet at onset of migraine and repeat up to twice if needed). If recurs, may repeat after 2 hours. Max of 3 tabs in 24 hours.     [provider]  loperamide (IMODIUM) 2 MG capsule Take 2-4 mg as needed by mouth for diarrhea or loose stools.    [provider]  loratadine (CLARITIN) 10 MG tablet Take 10 mg daily by mouth.     [provider]  losartan (COZAAR) 25 MG tablet Take 25 mg by mouth 2 (two) times daily.     [provider]  Multiple Vitamin (MULTIVITAMIN) tablet Take 1 tablet every other day by mouth.     [provider]  pyridOXINE (VITAMIN B-6) 100 MG tablet Take by mouth.    [provider]  RABEprazole (ACIPHEX) 20 MG tablet Take 20 mg by mouth daily as needed.     [provider]  simethicone (MYLICON) 80 MG chewable tablet Chew 80 mg every 6 (six) hours as needed by mouth for flatulence.    [provider]  sodium chloride (OCEAN) 0.65 % SOLN nasal spray Place 1 spray as needed into both nostrils for congestion.    [provider]    ROS:  Out of a complete 14 system review of symptoms, the patient complains only of the following symptoms, and all other reviewed systems are negative.  Headache Dizziness Paresthesias  Height 5\' 9"  (1.753 m), weight 161 lb (73 kg).  Physical Exam  General: The patient is alert and cooperative at the time of the examination.  Skin: No significant peripheral edema is noted.   Neurologic  Exam  Mental status: The patient is alert and oriented x 3 at the time of the examination. The patient has apparent normal recent and remote memory, with an apparently normal attention span and concentration ability.   Cranial nerves: Facial symmetry is present. Speech is normal, no aphasia or dysarthria is noted. Extraocular movements are full. Visual fields are full.  Pupils are equal, round, and reactive to light.  Discs are flat bilaterally.  Motor: The patient has good strength in all 4 extremities.  Sensory examination: Soft touch sensation is symmetric on the face, arms, and legs.  Coordination: The patient has good finger-nose-finger and heel-to-shin bilaterally.  Gait and station: The patient has a normal gait. Tandem gait is normal. Romberg is negative. No drift is seen.  Reflexes: Deep tendon reflexes are symmetric.   MRI cervical 02/10/19:  IMPRESSION: Unremarkable MRI scan of the cervical spine without contrast.   MRI brain 02/10/19:  IMPRESSION: Unremarkable MRI scan of the brain without contrast.  * MRI scan images were reviewed online. I agree with the written report.    Assessment/Plan:  1.  History of migraine headache  2.  Fibromyalgia  3.  Questionable history of sarcoidosis  4.  Intermittent paresthesias  The patient is having some symptoms of right-sided frontal and temporal pressure or lightheaded sensations.  She does have a history of migraine headache and is having daily headaches.  She reports some recent increase in stress associated with her job.  The patient will be placed on Topamax, she is already on doxepin.  We will work up to a 75 mg nightly dose, she will call for any dose adjustments.  She will follow-up here in 4 months.  02/12/19 MD 04/11/2020 7:39 AM  [facility] [address] [place],  [place] [address]  Phone [phone] Fax [phone]

## 2020-04-11 NOTE — Patient Instructions (Signed)
We will start Topamax for the headache and the head heaviness sensation.  Topamax (topiramate) is a seizure medication that has an FDA approval for seizures and for migraine headache. Potential side effects of this medication include weight loss, cognitive slowing, tingling in the fingers and toes, and carbonated drinks will taste bad. If any significant side effects are noted on this drug, please contact our office.

## 2020-04-27 ENCOUNTER — Ambulatory Visit: Payer: 59 | Attending: Internal Medicine

## 2020-04-27 DIAGNOSIS — Z23 Encounter for immunization: Secondary | ICD-10-CM

## 2020-04-27 NOTE — Progress Notes (Signed)
   Covid-19 Vaccination Clinic  Name:  MICKENZIE STOLAR    MRN: [id number] DOB: [birth year]  04/27/2020  Ms. Podolsky was observed post Covid-19 immunization for 30 minutes based on pre-vaccination screening without incident. She was provided with Vaccine Information Sheet and instruction to access the V-Safe system.   Ms. Ast was instructed to call 911 with any severe reactions post vaccine: Marland Kitchen Difficulty breathing  . Swelling of face and throat  . A fast heartbeat  . A bad rash all over body  . Dizziness and weakness

## 2020-05-09 ENCOUNTER — Telehealth: Payer: Self-pay | Admitting: Neurology

## 2020-05-09 NOTE — Telephone Encounter (Signed)
Events noted, I assume the patient will call if she wishes to go on another medication.

## 2020-05-09 NOTE — Telephone Encounter (Signed)
Pt. states she has decided not to take topiramate (TOPAMAX) 25 MG tablet after reading all of the different side effects. She states she wanted to let Dr. know.

## 2020-08-15 ENCOUNTER — Ambulatory Visit: Payer: BC Managed Care – PPO | Admitting: Neurology

## 2020-10-30 ENCOUNTER — Ambulatory Visit: Payer: BC Managed Care – PPO | Admitting: Neurology

## 2020-11-29 ENCOUNTER — Encounter (INDEPENDENT_AMBULATORY_CARE_PROVIDER_SITE_OTHER): Payer: 59 | Admitting: Ophthalmology

## 2020-12-23 ENCOUNTER — Encounter (INDEPENDENT_AMBULATORY_CARE_PROVIDER_SITE_OTHER): Payer: BC Managed Care – PPO | Admitting: Ophthalmology

## 2020-12-23 ENCOUNTER — Other Ambulatory Visit: Payer: Self-pay

## 2020-12-23 DIAGNOSIS — I10 Essential (primary) hypertension: Secondary | ICD-10-CM

## 2020-12-23 DIAGNOSIS — H33303 Unspecified retinal break, bilateral: Secondary | ICD-10-CM

## 2020-12-23 DIAGNOSIS — H43813 Vitreous degeneration, bilateral: Secondary | ICD-10-CM

## 2020-12-23 DIAGNOSIS — H35033 Hypertensive retinopathy, bilateral: Secondary | ICD-10-CM

## 2020-12-26 ENCOUNTER — Encounter: Payer: Self-pay | Admitting: Neurology

## 2020-12-26 ENCOUNTER — Ambulatory Visit: Payer: BC Managed Care – PPO | Admitting: Neurology

## 2020-12-26 VITALS — BP 132/92 | HR 89 | Ht 69.0 in | Wt 164.0 lb

## 2020-12-26 DIAGNOSIS — G43009 Migraine without aura, not intractable, without status migrainosus: Secondary | ICD-10-CM

## 2020-12-26 DIAGNOSIS — M797 Fibromyalgia: Secondary | ICD-10-CM

## 2020-12-26 NOTE — Progress Notes (Signed)
PATIENT: Kristin [name] DOB: June 09, 1966  REASON FOR VISIT: follow up HISTORY FROM: patient Primary Neurologist: Dr. Anne Hahn   Today 12/26/20 Kristin [name] is a 55 year old female with history of migraine headache and intermittent paresthesias ( for 10 years). Has fibromyalgia type symptoms. MRI brain and cervical spine were normal Aug 2020, she refused contrast, would have been best with contrast given possible sarcoid history. Placed on Topamax last visit for headache, decided not to take it, worry about side effect. Takes Frova PRN for headache. The altered sensation on the right frontal mostly resolved, still feels larger at times. Migraines are typically left sided. Having to take more Frova, stress at work, works at [employer]. Had migraine yesterday, her mom had been in hospital. Used to go to [facility]. Thinks sensory issues are better. Sees pulmonary for sarcoidosis, is very mild, diagnosis in 2018. Is currently trying to get back in with pulmonary, her previous is no longer in the office. Questioned LAM? Remains on Doxepin for migraine prevention and IBS, worries may cause dementia. Migraine frequency varies, can go several weeks without one. Has a twin sister, with migraines, they compare treatments. Here today alone.    HISTORY 04/11/2020 Dr. Anne Hahn: Kristin [name] is a 55 year old right-handed white female with a history of migraine headaches.  The patient has irritable bowel syndrome and fibromyalgia type symptoms.  There is some question whether she has sarcoidosis, but it appears that her pulmonologist now questions this diagnosis.  The patient has had a history of intermittent migratory paresthesias that have been present for greater than 10 years, she had MRI of the brain and cervical spine on her last visit over a year ago, the studies were ordered with and without contrast due to the history of sarcoidosis, but the patient refused the contrast agent.  The studies were normal.   The patient returns today with a 1 month history of a heavy feeling in the head on the right side, sometimes associated with lightheaded sensations.  The patient does have a prior history of vertigo off and on.  She claims that her migraine headaches usually occur on the left side of the head and spread to the frontal areas, may be severe once or twice a month where she has to take Frova which is helpful.  The patient has some form of headache almost every day however.  The patient denies any recent medication changes.  The patient does report some photophobia and phonophobia with the headache, she denies any nausea or vomiting.  She returns to the office today for an evaluation.   REVIEW OF SYSTEMS: Out of a complete 14 system review of symptoms, the patient complains only of the following symptoms, and all other reviewed systems are negative.  See HPI  ALLERGIES: Allergies  Allergen Reactions   Amoxicillin Hives    Has patient had a PCN reaction causing immediate rash, facial/tongue/throat swelling, SOB or lightheadedness with hypotension: UNKNOWN  Has patient had a PCN reaction causing severe rash involving mucus membranes or skin necrosis:  #  #  #  NO  #  #  #  Has patient had a PCN reaction that required hospitalization  #  #  #  NO  #  #  #  Has patient had a PCN reaction occurring within the last 10 years: #  #  #  YES  #  #  #  If all "NO's", may proceed with Cephalosporin use.   Doxycycline Hives  Indocin [Indomethacin] Hives   Levaquin [Levofloxacin] Hives   Probiotic [Acidophilus] Hives   Bentyl [Dicyclomine Hcl] Diarrhea   Erythromycin Nausea Only    STOMACH CRAMPS   Keflex [Cephalexin] Rash   Lansoprazole Other (See Comments)    HEADACHE   Nexium [Esomeprazole] Hives and Other (See Comments)    INTOLERANCE >  HEADACHE    Sudafed [Pseudoephedrine Hcl] Other (See Comments)    TACHYCARDIA   Sulfamethoxazole Rash   Tetanus Toxoids Other (See Comments)    LOCAL  REACTION...REDNESS/KNOT    HOME MEDICATIONS: Outpatient Medications Prior to Visit  Medication Sig Dispense Refill   acetaminophen (TYLENOL) 500 MG tablet Take 500-1,000 mg every 8 (eight) hours as needed by mouth for mild pain (depends on pain if takes 12 tablets).      aluminum-magnesium hydroxide-simethicone (MAALOX) 200-200-20 MG/5ML SUSP Take 30 mLs by mouth 2 (two) times daily as needed.     Artificial Tear Solution (SOOTHE XP OP) Apply 2 drops at bedtime to eye.     Biotin 1610910000 MCG TABS Take 1 tablet by mouth daily.     Cholecalciferol (VITAMIN D3) 5000 units CAPS Take 5,000 Units daily by mouth.      doxepin (SINEQUAN) 10 MG capsule Take 40-60 mg at bedtime by mouth. Depends on IBS symptoms and migraines if takes 4-6     frovatriptan (FROVA) 2.5 MG tablet Take 2.5 mg as needed by mouth for migraine (take 1 tablet at onset of migraine and repeat up to twice if needed). If recurs, may repeat after 2 hours. Max of 3 tabs in 24 hours.      loperamide (IMODIUM) 2 MG capsule Take 2-4 mg as needed by mouth for diarrhea or loose stools.     loratadine (CLARITIN) 10 MG tablet Take 10 mg by mouth daily.     losartan (COZAAR) 25 MG tablet Take 25 mg by mouth 2 (two) times daily.      Multiple Vitamin (MULTIVITAMIN) tablet Take 1 tablet every other day by mouth.      pyridOXINE (VITAMIN B-6) 100 MG tablet Take by mouth.     sodium chloride (OCEAN) 0.65 % SOLN nasal spray Place 1 spray as needed into both nostrils for congestion.     albuterol (PROAIR HFA) 108 (90 Base) MCG/ACT inhaler Inhale 2 puffs into the lungs every 4 (four) hours as needed for wheezing or shortness of breath. 8 g 5   RABEprazole (ACIPHEX) 20 MG tablet Take 20 mg by mouth daily as needed.      simethicone (MYLICON) 80 MG chewable tablet Chew 80 mg every 6 (six) hours as needed by mouth for flatulence.     topiramate (TOPAMAX) 25 MG tablet Take one tablet at night for one week, then take 2 tablets at night for one week, then  take 3 tablets at night. 90 tablet 3   No facility-administered medications prior to visit.    PAST MEDICAL HISTORY: Past Medical History:  Diagnosis Date   Anxiety    Dizziness    Dysrhythmia    palpitations   Fibromyalgia 04/13/2017   GERD (gastroesophageal reflux disease)    Headache    History of migraine headaches    Hypertension    hx arrhythmia   IBS (irritable bowel syndrome)    Lymphadenopathy    Seasonal allergies    Thyroid nodule    Vertigo    Vitamin D deficiency     PAST SURGICAL HISTORY: Past Surgical History:  Procedure Laterality Date  DILATION AND CURETTAGE OF UTERUS  2006, 2009   EYE SURGERY Bilateral 1997   LASER   THYOID NODULE  05/2009   FNA...DR. Richardson Landry .Marland KitchenMarland KitchenMarland KitchenNEG   VAGINAL HYSTERECTOMY  12/2017   LAPAROSCOPICALLY ASSISTED/DR. ELIZABETH SKINNER   VIDEO ASSISTED THORACOSCOPY (VATS)/ LYMPH NODE SAMPLING Left 05/28/2017   Procedure: LEFT PERISTERNAL EXPLORATION, LEFT VIDEO ASSISTED THORACOSCOPY (VATS), MEDIASTINAL AND AP WINDOW LYMPH NODE DISSECTION;  Surgeon: Delight Ovens, MD;  Location: [facility] OR;  Service: Thoracic;  Laterality: Left;   VIDEO BRONCHOSCOPY WITH ENDOBRONCHIAL ULTRASOUND N/A 05/17/2017   Procedure: VIDEO BRONCHOSCOPY WITH ENDOBRONCHIAL ULTRASOUND WITH TRANSBRONCHIAL BX OF LYMPH NODES 7, 10 R AND 10 L.;  Surgeon: Delight Ovens, MD;  Location: [facility] OR;  Service: Thoracic;  Laterality: N/A;    FAMILY HISTORY: Family History  Problem Relation Age of Onset   Hypertension Mother    Diabetes Mother    Hyperthyroidism Mother    Hypertension Father    Heart disease Father    Hypothyroidism Father    Cardiomyopathy Father    Other Father 50       CAUSE OF DEATH..CEREBRAL HEMORR   Cardiomyopathy Paternal Uncle    Heart disease Paternal Uncle        CABG   Diabetes Maternal Grandmother    Hypertension Maternal Grandmother    Heart disease Maternal Grandmother        MI   Other Maternal Grandmother        GRAVES DISEASE    Cardiomyopathy Maternal Grandmother    Other Maternal Grandfather        AAA   Cardiomyopathy Maternal Grandfather    Heart disease Maternal Grandfather        MI   Cancer Paternal Grandmother        BREAST, OTHER UNSPECIFIED SITE   Breast cancer Paternal Grandmother    Hypertension Paternal Grandfather    Stroke Paternal Grandfather        ISCHEMIC STROKE   Other Maternal Aunt        THYROID DISEASE   Heart disease Paternal Aunt        CABG   Cardiomyopathy Paternal Aunt    Cancer Paternal Aunt        STOMACH   Other Cousin        THYROID    Breast cancer Cousin     SOCIAL HISTORY: Social History   Socioeconomic History   Marital status: Single    Spouse name: Not on file   Number of children: Not on file   Years of education: Not on file   Highest education level: Not on file  Occupational History   Not on file  Tobacco Use   Smoking status: Never   Smokeless tobacco: Never  Substance and Sexual Activity   Alcohol use: No   Drug use: No   Sexual activity: Never    Partners: Male  Other Topics Concern   Not on file  Social History Narrative   Right handed    Social Determinants of Health   Financial Resource Strain: Not on file  Food Insecurity: Not on file  Transportation Needs: Not on file  Physical Activity: Not on file  Stress: Not on file  Social Connections: Not on file  Intimate Partner Violence: Not on file   PHYSICAL EXAM  Vitals:   12/26/20 1026  BP: (!) 132/92  Pulse: 89  Weight: 164 lb (74.4 kg)  Height: 5\' 9"  (1.753 m)   Body mass index is  24.22 kg/m.  Generalized: Well developed, in no acute distress, very soft spoken Neurological examination  Mentation: Alert oriented to time, place, history taking. Follows all commands speech and language fluent Cranial nerve II-XII: Pupils were equal round reactive to light. Extraocular movements were full, visual field were full on confrontational test. Facial sensation and strength were  normal. Head turning and shoulder shrug  were normal and symmetric. Motor: Good strength all extremities Sensory: Sensory testing is intact to soft touch on all 4 extremities. No evidence of extinction is noted.  Coordination: Cerebellar testing reveals good finger-nose-finger and heel-to-shin bilaterally.  Gait and station: Gait is normal. Tandem gait is slightly unsteady.  Romberg is negative. No drift is seen.  Reflexes: Deep tendon reflexes are symmetric and normal bilaterally.   DIAGNOSTIC DATA (LABS, IMAGING, TESTING) - I reviewed patient records, labs, notes, testing and imaging myself where available.  Lab Results  Component Value Date   WBC 12.0 (H) 05/30/2017   HGB 11.8 (L) 05/30/2017   HCT 37.2 05/30/2017   MCV 86.9 05/30/2017   PLT 196 05/30/2017      Component Value Date/Time   NA 142 01/19/2019 1618   K 4.2 01/19/2019 1618   CL 103 01/19/2019 1618   CO2 22 01/19/2019 1618   GLUCOSE 72 01/19/2019 1618   GLUCOSE 110 (H) 05/30/2017 0230   BUN 17 01/19/2019 1618   CREATININE 0.82 01/19/2019 1618   CALCIUM 9.6 01/19/2019 1618   PROT 7.5 01/19/2019 1618   ALBUMIN 4.8 01/19/2019 1618   AST 16 01/19/2019 1618   ALT 18 01/19/2019 1618   ALKPHOS 97 01/19/2019 1618   BILITOT 0.3 01/19/2019 1618   GFRNONAA 83 01/19/2019 1618   GFRAA 95 01/19/2019 1618   No results found for: CHOL, HDL, LDLCALC, LDLDIRECT, TRIG, CHOLHDL No results found for: YQMV7Q Lab Results  Component Value Date   VITAMINB12 785 01/19/2019   No results found for: TSH    ASSESSMENT AND PLAN 55 y.o. year old female  has a past medical history of Anxiety, Dizziness, Dysrhythmia, Fibromyalgia (04/13/2017), GERD (gastroesophageal reflux disease), Headache, History of migraine headaches, Hypertension, IBS (irritable bowel syndrome), Lymphadenopathy, Seasonal allergies, Thyroid nodule, Vertigo, and Vitamin D deficiency. here with:  1.  History of migraine headache   2.  Fibromyalgia   3.   Questionable history of sarcoidosis   4.  Intermittent paresthesias  -Migraines doing well at this point, will discuss with her PCP who prescribes doxepin for migraine prevention and IBS about switching to another medication, as she is concerned about the long-term risk of developing dementia, some options for migraine prevention may be propanolol or Effexor, never started Topamax due to fear of side effect, we are not prescribing any medications from this office -Needs to get back in with pulmonary, possible mild sarcoidosis, however the possibility of LAM was raised vs sarcoid -MRI of the brain and cervical spine has previously been done at our office for work-up of intermittent paresthesias, they were normal, but were done without contrast after she refused -She will follow-up at our office on an as-needed basis, call for any new or worsening symptoms  I spent 35 minutes of face-to-face and non-face-to-face time with patient.  This included previsit chart review, lab review, study review, order entry, discussing medications for migraine, reviewing MRI studies, management, and follow-up.  Margie Ege, AGNP-C, DNP 12/26/2020, 11:26 AM [facility] [address], [address] [place], [place] [address] [phone]

## 2020-12-26 NOTE — Progress Notes (Signed)
I have read the note, and I agree with the clinical assessment and plan.  France Lusty K Yara Tomkinson   

## 2020-12-26 NOTE — Patient Instructions (Signed)
Can discuss propranolol with your primary care doctor for migraine management  Other option may be Effexor-XR for headache prevention, may help with nerve pain Please get back to see pulmonary  See you back as needed

## 2021-02-04 ENCOUNTER — Other Ambulatory Visit: Payer: Self-pay | Admitting: Obstetrics and Gynecology

## 2021-02-04 DIAGNOSIS — Z1231 Encounter for screening mammogram for malignant neoplasm of breast: Secondary | ICD-10-CM

## 2021-03-28 ENCOUNTER — Ambulatory Visit
Admission: RE | Admit: 2021-03-28 | Discharge: 2021-03-28 | Disposition: A | Discharge status: Home / Self Care | Payer: BC Managed Care – PPO | Source: Ambulatory Visit | Attending: Obstetrics and Gynecology | Admitting: Obstetrics and Gynecology

## 2021-03-28 ENCOUNTER — Ambulatory Visit: Payer: 59

## 2021-03-28 ENCOUNTER — Other Ambulatory Visit: Payer: Self-pay

## 2021-03-28 DIAGNOSIS — Z1231 Encounter for screening mammogram for malignant neoplasm of breast: Secondary | ICD-10-CM

## 2021-07-09 LAB — COLOGUARD: COLOGUARD: NEGATIVE

## 2021-10-09 ENCOUNTER — Other Ambulatory Visit: Payer: Self-pay | Admitting: Obstetrics and Gynecology

## 2021-10-09 DIAGNOSIS — N6320 Unspecified lump in the left breast, unspecified quadrant: Secondary | ICD-10-CM

## 2021-11-03 ENCOUNTER — Inpatient Hospital Stay: Admission: RE | Admit: 2021-11-03 | Payer: BC Managed Care – PPO | Source: Ambulatory Visit

## 2021-11-19 ENCOUNTER — Other Ambulatory Visit: Payer: Self-pay | Admitting: Obstetrics and Gynecology

## 2021-11-19 DIAGNOSIS — N644 Mastodynia: Secondary | ICD-10-CM

## 2021-11-28 ENCOUNTER — Other Ambulatory Visit: Payer: BC Managed Care – PPO

## 2021-12-10 ENCOUNTER — Ambulatory Visit
Admission: RE | Admit: 2021-12-10 | Discharge: 2021-12-10 | Disposition: A | Discharge status: Home / Self Care | Payer: BC Managed Care – PPO | Source: Ambulatory Visit | Attending: Obstetrics and Gynecology | Admitting: Obstetrics and Gynecology

## 2021-12-10 DIAGNOSIS — N6320 Unspecified lump in the left breast, unspecified quadrant: Secondary | ICD-10-CM

## 2021-12-10 DIAGNOSIS — N644 Mastodynia: Secondary | ICD-10-CM

## 2021-12-24 ENCOUNTER — Encounter (INDEPENDENT_AMBULATORY_CARE_PROVIDER_SITE_OTHER): Payer: BC Managed Care – PPO | Admitting: Ophthalmology

## 2021-12-24 DIAGNOSIS — H43813 Vitreous degeneration, bilateral: Secondary | ICD-10-CM

## 2021-12-24 DIAGNOSIS — H33303 Unspecified retinal break, bilateral: Secondary | ICD-10-CM

## 2021-12-24 DIAGNOSIS — H35033 Hypertensive retinopathy, bilateral: Secondary | ICD-10-CM

## 2021-12-24 DIAGNOSIS — I10 Essential (primary) hypertension: Secondary | ICD-10-CM

## 2022-02-23 ENCOUNTER — Encounter: Payer: Self-pay | Admitting: Diagnostic Neuroimaging

## 2022-02-23 ENCOUNTER — Ambulatory Visit: Payer: BC Managed Care – PPO | Admitting: Diagnostic Neuroimaging

## 2022-02-23 VITALS — BP 141/94 | HR 83 | Ht 69.0 in | Wt 159.5 lb

## 2022-02-23 DIAGNOSIS — R202 Paresthesia of skin: Secondary | ICD-10-CM

## 2022-02-23 DIAGNOSIS — G4719 Other hypersomnia: Secondary | ICD-10-CM | POA: Diagnosis not present

## 2022-02-23 NOTE — Patient Instructions (Signed)
  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

## 2022-02-23 NOTE — Progress Notes (Signed)
[facility]  PATIENT: Kristin [name] DOB: 12-Apr-1966  REFERRING CLINICIAN: Ivan Anchors, MD HISTORY FROM: patient REASON FOR VISIT: new consult   HISTORICAL  CHIEF COMPLAINT:  Chief Complaint  Patient presents with   New Patient (Initial Visit)    She states her paresthesia has been better. She reports on July 10 it started out the blue with numbness and tingling all over face and body. She reports it was moderate to severe that week. She felt that her muscles were fatigued and only lasted for a few days. Room 7 alone    HISTORY OF PRESENT ILLNESS:  UPDATE (02/23/22, VRP): 56 year old female with numbness.   January 05, 2022 --> numbness at work (calves, arms, face; over the course of a day; similar to prior events). Also malaise x 1 week prior. Muscle fatigue sensation. Since then has improved.  Some excessive daytime sleepiness. Some fibromyalgia type symptoms.   PRIOR HPI (04/11/20):  Kristin [name] is a 56 year old right-handed white female with a history of migraine headaches.  The patient has irritable bowel syndrome and fibromyalgia type symptoms.  There is some question whether she has sarcoidosis, but it appears that her pulmonologist now questions this diagnosis.  The patient has had a history of intermittent migratory paresthesias that have been present for greater than 10 years, she had MRI of the brain and cervical spine on her last visit over a year ago, the studies were ordered with and without contrast due to the history of sarcoidosis, but the patient refused the contrast agent.  The studies were normal.  The patient returns today with a 1 month history of a heavy feeling in the head on the right side, sometimes associated with lightheaded sensations.  The patient does have a prior history of vertigo off and on.  She claims that her migraine headaches usually occur on the left side of the head and spread to the frontal areas, may be severe once or twice  a month where she has to take Frova which is helpful.  The patient has some form of headache almost every day however.  The patient denies any recent medication changes.  The patient does report some photophobia and phonophobia with the headache, she denies any nausea or vomiting.  She returns to the office today for an evaluation.   REVIEW OF SYSTEMS: Full 14 system review of systems performed and negative with exception of: as per HHPI.  ALLERGIES: Allergies  Allergen Reactions   Amoxicillin Hives    Has patient had a PCN reaction causing immediate rash, facial/tongue/throat swelling, SOB or lightheadedness with hypotension: UNKNOWN  Has patient had a PCN reaction causing severe rash involving mucus membranes or skin necrosis:  #  #  #  NO  #  #  #  Has patient had a PCN reaction that required hospitalization  #  #  #  NO  #  #  #  Has patient had a PCN reaction occurring within the last 10 years: #  #  #  YES  #  #  #  If all "NO's", may proceed with Cephalosporin use.   Doxycycline Hives   Indocin [Indomethacin] Hives   Levaquin [Levofloxacin] Hives   Probiotic [Acidophilus] Hives   Bentyl [Dicyclomine Hcl] Diarrhea   Erythromycin Nausea Only    STOMACH CRAMPS   Keflex [Cephalexin] Rash   Lansoprazole Other (See Comments)    HEADACHE   Nexium [Esomeprazole] Hives and Other (See Comments)  INTOLERANCE >  HEADACHE    Sudafed [Pseudoephedrine Hcl] Other (See Comments)    TACHYCARDIA   Sulfamethoxazole Rash   Tetanus Toxoids Other (See Comments)    LOCAL REACTION...REDNESS/KNOT    HOME MEDICATIONS: Outpatient Medications Prior to Visit  Medication Sig Dispense Refill   acetaminophen (TYLENOL) 500 MG tablet Take 500-1,000 mg every 8 (eight) hours as needed by mouth for mild pain (depends on pain if takes 12 tablets).      aluminum-magnesium hydroxide-simethicone (MAALOX) 623-762-83 MG/5ML SUSP Take 30 mLs by mouth 2 (two) times daily as needed.     Artificial Tear Solution  (SOOTHE XP OP) Apply 2 drops at bedtime to eye.     Biotin 10000 MCG TABS Take 1 tablet by mouth daily.     Cholecalciferol (VITAMIN D3) 5000 units CAPS Take 5,000 Units daily by mouth.      doxepin (SINEQUAN) 10 MG capsule Take 40-60 mg at bedtime by mouth. Depends on IBS symptoms and migraines if takes 4-6     frovatriptan (FROVA) 2.5 MG tablet Take 2.5 mg as needed by mouth for migraine (take 1 tablet at onset of migraine and repeat up to twice if needed). If recurs, may repeat after 2 hours. Max of 3 tabs in 24 hours.      loperamide (IMODIUM) 2 MG capsule Take 2-4 mg as needed by mouth for diarrhea or loose stools.     loratadine (CLARITIN) 10 MG tablet Take 10 mg by mouth daily.     losartan (COZAAR) 25 MG tablet Take 25 mg by mouth 2 (two) times daily.      Multiple Vitamin (MULTIVITAMIN) tablet Take 1 tablet every other day by mouth.      pyridOXINE (VITAMIN B-6) 100 MG tablet Take by mouth.     sodium chloride (OCEAN) 0.65 % SOLN nasal spray Place 1 spray as needed into both nostrils for congestion.     albuterol (PROAIR HFA) 108 (90 Base) MCG/ACT inhaler Inhale 2 puffs into the lungs every 4 (four) hours as needed for wheezing or shortness of breath. 8 g 5   No facility-administered medications prior to visit.    PAST MEDICAL HISTORY: Past Medical History:  Diagnosis Date   Anxiety    Dizziness    Dysrhythmia    palpitations   Fibromyalgia 04/13/2017   GERD (gastroesophageal reflux disease)    Headache    History of migraine headaches    Hypertension    hx arrhythmia   IBS (irritable bowel syndrome)    Lymphadenopathy    Seasonal allergies    Thyroid nodule    Vertigo    Vitamin D deficiency     PAST SURGICAL HISTORY: Past Surgical History:  Procedure Laterality Date   DILATION AND CURETTAGE OF UTERUS  2006, 2009   EYE SURGERY Bilateral 1997   LASER   THYOID NODULE  05/2009   FNA...DR. Davonna Belling .Marland KitchenMarland KitchenMarland KitchenNEG   VAGINAL HYSTERECTOMY  12/2017   LAPAROSCOPICALLY  ASSISTED/DR. ELIZABETH SKINNER   VIDEO ASSISTED THORACOSCOPY (VATS)/ LYMPH NODE SAMPLING Left 05/28/2017   Procedure: LEFT PERISTERNAL EXPLORATION, LEFT VIDEO ASSISTED THORACOSCOPY (VATS), MEDIASTINAL AND AP WINDOW LYMPH NODE DISSECTION;  Surgeon: Grace Isaac, MD;  Location: [facility];  Service: Thoracic;  Laterality: Left;   VIDEO BRONCHOSCOPY WITH ENDOBRONCHIAL ULTRASOUND N/A 05/17/2017   Procedure: VIDEO BRONCHOSCOPY WITH ENDOBRONCHIAL ULTRASOUND WITH TRANSBRONCHIAL BX OF LYMPH NODES 7, 10 R AND 10 L.;  Surgeon: Grace Isaac, MD;  Location: [place];  Service: Thoracic;  Laterality:  N/A;    FAMILY HISTORY: Family History  Problem Relation Age of Onset   Hypertension Mother    Diabetes Mother    Hyperthyroidism Mother    Hypertension Father    Heart disease Father    Hypothyroidism Father    Cardiomyopathy Father    Other Father 61       CAUSE OF DEATH..CEREBRAL HEMORR   Cardiomyopathy Paternal Uncle    Heart disease Paternal Uncle        CABG   Diabetes Maternal Grandmother    Hypertension Maternal Grandmother    Heart disease Maternal Grandmother        MI   Other Maternal Grandmother        GRAVES DISEASE   Cardiomyopathy Maternal Grandmother    Other Maternal Grandfather        AAA   Cardiomyopathy Maternal Grandfather    Heart disease Maternal Grandfather        MI   Cancer Paternal Grandmother        BREAST, OTHER UNSPECIFIED SITE   Breast cancer Paternal Grandmother    Hypertension Paternal Grandfather    Stroke Paternal Grandfather        ISCHEMIC STROKE   Other Maternal Aunt        THYROID DISEASE   Heart disease Paternal Aunt        CABG   Cardiomyopathy Paternal Aunt    Cancer Paternal Aunt        STOMACH   Other Cousin        THYROID    Breast cancer Cousin     SOCIAL HISTORY: Social History   Socioeconomic History   Marital status: Single    Spouse name: Not on file   Number of children: Not on file   Years of education: Not on file    Highest education level: Not on file  Occupational History   Not on file  Tobacco Use   Smoking status: Never   Smokeless tobacco: Never  Substance and Sexual Activity   Alcohol use: No   Drug use: No   Sexual activity: Never    Partners: Male  Other Topics Concern   Not on file  Social History Narrative   Right handed    Social Determinants of Health   Financial Resource Strain: Not on file  Food Insecurity: Not on file  Transportation Needs: Not on file  Physical Activity: Not on file  Stress: Not on file  Social Connections: Not on file  Intimate Partner Violence: Not on file     PHYSICAL EXAM  GENERAL EXAM/CONSTITUTIONAL: Vitals:  Vitals:   02/23/22 0902  BP: (!) 141/94  Pulse: 83  Weight: 159 lb 8 oz (72.3 kg)  Height: 5' 9"  (1.753 m)   Body mass index is 23.55 kg/m. Wt Readings from Last 3 Encounters:  02/23/22 159 lb 8 oz (72.3 kg)  12/26/20 164 lb (74.4 kg)  04/11/20 161 lb (73 kg)   Patient is in no distress; well developed, nourished and groomed; neck is supple  CARDIOVASCULAR: Examination of carotid arteries is normal; no carotid bruits Regular rate and rhythm, no murmurs Examination of peripheral vascular system by observation and palpation is normal  EYES: Ophthalmoscopic exam of optic discs and posterior segments is normal; no papilledema or hemorrhages No results found.  MUSCULOSKELETAL: Gait, strength, tone, movements noted in Neurologic exam below  NEUROLOGIC: MENTAL STATUS:      No data to display  awake, alert, oriented to person, place and time recent and remote memory intact normal attention and concentration language fluent, comprehension intact, naming intact fund of knowledge appropriate  CRANIAL NERVE:  2nd - no papilledema on fundoscopic exam 2nd, 3rd, 4th, 6th - pupils equal and reactive to light, visual fields full to confrontation, extraocular muscles intact, no nystagmus 5th - facial sensation  symmetric 7th - facial strength symmetric 8th - hearing intact 9th - palate elevates symmetrically, uvula midline 11th - shoulder shrug symmetric 12th - tongue protrusion midline  MOTOR:  normal bulk and tone, full strength in the BUE, BLE  SENSORY:  normal and symmetric to light touch, pinprick, temperature, vibration  COORDINATION:  finger-nose-finger, fine finger movements normal  REFLEXES:  deep tendon reflexes present and symmetric  GAIT/STATION:  narrow based gait     DIAGNOSTIC DATA (LABS, IMAGING, TESTING) - I reviewed patient records, labs, notes, testing and imaging myself where available.  Lab Results  Component Value Date   WBC 12.0 (H) 05/30/2017   HGB 11.8 (L) 05/30/2017   HCT 37.2 05/30/2017   MCV 86.9 05/30/2017   PLT 196 05/30/2017      Component Value Date/Time   NA 142 01/19/2019 1618   K 4.2 01/19/2019 1618   CL 103 01/19/2019 1618   CO2 22 01/19/2019 1618   GLUCOSE 72 01/19/2019 1618   GLUCOSE 110 (H) 05/30/2017 0230   BUN 17 01/19/2019 1618   CREATININE 0.82 01/19/2019 1618   CALCIUM 9.6 01/19/2019 1618   PROT 7.5 01/19/2019 1618   ALBUMIN 4.8 01/19/2019 1618   AST 16 01/19/2019 1618   ALT 18 01/19/2019 1618   ALKPHOS 97 01/19/2019 1618   BILITOT 0.3 01/19/2019 1618   GFRNONAA 83 01/19/2019 1618   GFRAA 95 01/19/2019 1618   No results found for: "CHOL", "HDL", "LDLCALC", "LDLDIRECT", "TRIG", "CHOLHDL" No results found for: "HGBA1C" Lab Results  Component Value Date   VITAMINB12 785 01/19/2019   No results found for: "TSH"     ASSESSMENT AND PLAN  56 y.o. year old female here with:   Dx:  1. Paresthesia   2. 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    Penni Bombard, MD 5/73/2202, 5:42 AM Certified in Neurology, Neurophysiology and Neuroimaging  [facility] [address], [place], [place] [address] [phone]

## 2022-02-27 LAB — ANCA PROFILE
Anti-MPO Antibodies: <0.2 units (ref 0.0–0.9)
Anti-PR3 Antibodies: <0.2 units (ref 0.0–0.9)
Atypical pANCA: <1:20 {titer}
C-ANCA: <1:20 {titer}
P-ANCA: <1:20 {titer}

## 2022-02-27 LAB — ANA,IFA RA DIAG PNL W/RFLX TIT/PATN
ANA Titer 1: NEGATIVE
Cyclic Citrullin Peptide Ab: 2 units (ref 0–19)
Rheumatoid fact SerPl-aCnc: <10 IU/mL (ref <14.0)

## 2022-02-27 LAB — TSH: TSH: 0.582 u[IU]/mL (ref 0.450–4.500)

## 2022-02-27 LAB — MULTIPLE MYELOMA PANEL, SERUM
Albumin SerPl Elph-Mcnc: 3.6 g/dL (ref 2.9–4.4)
Albumin/Glob SerPl: 1.2 (ref 0.7–1.7)
Alpha 1: 0.2 g/dL (ref 0.0–0.4)
Alpha2 Glob SerPl Elph-Mcnc: 0.7 g/dL (ref 0.4–1.0)
B-Globulin SerPl Elph-Mcnc: 1.1 g/dL (ref 0.7–1.3)
Gamma Glob SerPl Elph-Mcnc: 1.1 g/dL (ref 0.4–1.8)
Globulin, Total: 3.1 g/dL (ref 2.2–3.9)
IgA/Immunoglobulin A, Serum: 215 mg/dL (ref 87–352)
IgG (Immunoglobin G), Serum: 1038 mg/dL (ref 586–1602)
IgM (Immunoglobulin M), Srm: 138 mg/dL (ref 26–217)
Total Protein: 6.7 g/dL (ref 6.0–8.5)

## 2022-02-27 LAB — HEMOGLOBIN A1C
Est. average glucose Bld gHb Est-mCnc: 128 mg/dL
Hgb A1c MFr Bld: 6.1 % — ABNORMAL HIGH (ref 4.8–5.6)

## 2022-02-27 LAB — ANGIOTENSIN CONVERTING ENZYME: Angio Convert Enzyme: 41 U/L (ref 14–82)

## 2022-02-27 LAB — SJOGREN'S SYNDROME ANTIBODS(SSA + SSB)
ENA SSA (RO) Ab: <0.2 AI (ref 0.0–0.9)
ENA SSB (LA) Ab: <0.2 AI (ref 0.0–0.9)

## 2022-02-27 LAB — VITAMIN B12: Vitamin B-12: 1050 pg/mL (ref 232–1245)

## 2022-03-09 ENCOUNTER — Telehealth: Payer: Self-pay

## 2022-03-09 ENCOUNTER — Telehealth: Payer: Self-pay | Admitting: Diagnostic Neuroimaging

## 2022-03-09 NOTE — Telephone Encounter (Signed)
While scheduling Ms. Tax for her sleep consultation, she asked about her labwork and if the results were in. Would someone be able to check on this for her? She said the best number to call with any updates would be [phone] and that it would be fine to leave a voicemail.  Thank you.

## 2022-03-09 NOTE — Telephone Encounter (Signed)
-----   Message from Suanne Marker, MD sent at 03/09/2022  3:16 PM EDT ----- Unremarkable labs, except very borderline A1c (sugar). -VRP

## 2022-03-09 NOTE — Telephone Encounter (Signed)
Contacted pt, LVM per DPR, informing her results came back Unremarkable labs, except very borderline A1c (sugar). Advised to follow up with PCP. Number provided to CB with questions

## 2022-03-09 NOTE — Telephone Encounter (Signed)
Please result labs

## 2022-04-01 ENCOUNTER — Encounter: Payer: Self-pay | Admitting: Neurology

## 2022-04-01 ENCOUNTER — Ambulatory Visit: Payer: BC Managed Care – PPO | Admitting: Neurology

## 2022-04-01 VITALS — BP 119/82 | HR 72 | Ht 69.0 in | Wt 158.8 lb

## 2022-04-01 DIAGNOSIS — G4719 Other hypersomnia: Secondary | ICD-10-CM | POA: Diagnosis not present

## 2022-04-01 DIAGNOSIS — Z820 Family history of epilepsy and other diseases of the nervous system: Secondary | ICD-10-CM

## 2022-04-01 DIAGNOSIS — R351 Nocturia: Secondary | ICD-10-CM | POA: Diagnosis not present

## 2022-04-01 DIAGNOSIS — R0683 Snoring: Secondary | ICD-10-CM | POA: Diagnosis not present

## 2022-04-01 NOTE — Patient Instructions (Addendum)
It was nice to meet you today.   Please remember to try to maintain good sleep hygiene, which means: Keep a regular sleep and wake schedule, try not to exercise or have a meal within 2 hours of your bedtime, try to keep your bedroom conducive for sleep, that is, cool and dark, without light distractors such as an illuminated alarm clock, and refrain from watching TV right before sleep (ideally 1-2 h before bedtime) or in the middle of the night and do not keep the TV or radio on during the night. Also, try not to use or play on electronic devices at bedtime, such as your cell phone, tablet PC or laptop. If you like to read at bedtime on an electronic device, try to dim the background light as much as possible. Do not eat in the middle of the night. Avoid caffeine after 3 PM. Please allow for 7.5 to 8.5 hours of sleep.   Based on your symptoms and your exam I believe you are at risk for obstructive sleep apnea (aka OSA). We should proceed with a sleep study to determine whether you do or do not have OSA and how severe it is. Even, if you have mild OSA, I may want you to consider treatment with CPAP, as treatment of even borderline or mild sleep apnea can result and improvement of symptoms such as sleep disruption, daytime sleepiness, nighttime bathroom breaks, restless leg symptoms, improvement of headache syndromes, even improved mood disorder.   As explained, an attended sleep study (meaning you get to stay overnight in the sleep lab), lets [place] monitor sleep-related behaviors such as sleep talking and leg movements in sleep, in addition to monitoring for sleep apnea.  A home sleep test is a screening tool for sleep apnea diagnosis only, but unfortunately, does not help with any other sleep-related diagnoses.  Please remember, the long-term risks and ramifications of untreated moderate to severe obstructive sleep apnea may include (but are not limited to): increased risk for cardiovascular disease, including  congestive heart failure, stroke, difficult to control hypertension, treatment resistant obesity, arrhythmias, especially irregular heartbeat commonly known as A. Fib. (atrial fibrillation); even type 2 diabetes has been linked to untreated OSA.   Other correlations that untreated obstructive sleep apnea include macular edema which is swelling of the retina in the eyes, droopy eyelid syndrome, and elevated hemoglobin and hematocrit levels (often referred to as polycythemia).  Sleep apnea can cause disruption of sleep and sleep deprivation in most cases, which, in turn, can cause recurrent headaches, problems with memory, mood, concentration, focus, and vigilance. Most people with untreated sleep apnea report excessive daytime sleepiness, which can affect their ability to drive. Please do not drive or use heavy equipment or machinery, if you feel sleepy! Patients with sleep apnea can also develop difficulty initiating and maintaining sleep (aka insomnia).   Having sleep apnea may increase your risk for other sleep disorders, including involuntary behaviors sleep such as sleep terrors, sleep talking, sleepwalking.    Having sleep apnea can also increase your risk for restless leg syndrome and leg movements at night.   Please note that untreated obstructive sleep apnea may carry additional perioperative morbidity. Patients with significant obstructive sleep apnea (typically, in the moderate to severe degree) should receive, if possible, perioperative PAP (positive airway pressure) therapy and the surgeons and particularly the anesthesiologists should be informed of the diagnosis and the severity of the sleep disordered breathing.   We will call you or email you through [place]  with regards to your test results and plan a follow-up in sleep clinic accordingly. Most likely, you will hear from one of our nurses.   Our sleep lab administrative assistant will call you to schedule your sleep study and give you  further instructions, regarding the check in process for the sleep study, arrival time, what to bring, when you can expect to leave after the study, etc., and to answer any other logistical questions you may have. If you don't hear back from her by about 2 weeks from now, please feel free to call her direct line at [phone] or you can call our general clinic number, or email [place] through My Chart.

## 2022-04-01 NOTE — Progress Notes (Signed)
Subjective:    Patient ID: Kristin [name] is a 56 y.o. female.  HPI    Star Age, MD, PhD [facility] [address], [address] P.O. [place],  [address]  Dear Kristin [name],   I saw your patient, Kaleisha Bhargava, upon your kind request in my sleep clinic today for initial consultation of her sleep disorder, in particular, excessive daytime somnolence.  The patient is unaccompanied today.  As you know, Ms. Kristin [name] is a 56 year old female with an underlying medical history of irritable bowel syndrome, migraine headaches, paresthesias, allergies, vitamin D deficiency, anxiety, fibromyalgia, hypertension, and vertigo, who reports feeling tired all the time.  She reports that her twin sister recently got diagnosed with sleep apnea and her mom has sleep apnea.  Patient reports that she suspects that she snores.  She is single and lives alone.  She has woken up with chest pain at times, has not had a checked out by primary care yet.  She denies recurrent nocturnal or morning headaches but has nocturia about once per average night.  She does not always get 7 to 8 hours of sleep, she admits that she goes to bed late around 11:30 PM or midnight.  She does watch TV in her bedroom at night and turns the TV off about 10 minutes before she falls asleep, she falls asleep quickly.  She denies any witnessed apneas or sense of gasping at night.  She takes doxepin at night, takes it close to bedtime.  She has a rise time of 6:15 AM.  She works with infants in a childcare facility.  She is a non-smoker and does not drink any alcohol, caffeine typically in the form of tea with lemonade with dinner which could be as late as 7:30 PM.  She has an appointment coming up with her primary care and requested blood test results from your office visit.  I suggested we send all her blood test results to Dr. Donnamarie Poag electronically, she was agreeable.  Her Epworth sleepiness score is 7 out of 24, fatigue severity  score is 31 out of 63.  She has had extensive blood work on 02/23/2022 and I reviewed the results.  I reviewed your office note from 02/23/2022.  Her Past Medical History Is Significant For: Past Medical History:  Diagnosis Date   Anxiety    Dizziness    Dysrhythmia    palpitations   Fibromyalgia 04/13/2017   GERD (gastroesophageal reflux disease)    Headache    History of migraine headaches    Hypertension    hx arrhythmia   IBS (irritable bowel syndrome)    Lymphadenopathy    Seasonal allergies    Thyroid nodule    Vertigo    Vitamin D deficiency     Her Past Surgical History Is Significant For: Past Surgical History:  Procedure Laterality Date   DILATION AND CURETTAGE OF UTERUS  2006, 2009   EYE SURGERY Bilateral 1997   LASER   THYOID NODULE  05/2009   [facility]...DR. Davonna Belling .Marland KitchenMarland KitchenMarland KitchenNEG   VAGINAL HYSTERECTOMY  12/2017   LAPAROSCOPICALLY ASSISTED/DR. ELIZABETH SKINNER   VIDEO ASSISTED THORACOSCOPY (VATS)/ LYMPH NODE SAMPLING Left 05/28/2017   Procedure: LEFT PERISTERNAL EXPLORATION, LEFT VIDEO ASSISTED THORACOSCOPY (VATS), MEDIASTINAL AND AP WINDOW LYMPH NODE DISSECTION;  Surgeon: Grace Isaac, MD;  Location: [facility];  Service: Thoracic;  Laterality: Left;   VIDEO BRONCHOSCOPY WITH ENDOBRONCHIAL ULTRASOUND N/A 05/17/2017   Procedure: VIDEO BRONCHOSCOPY WITH ENDOBRONCHIAL ULTRASOUND WITH TRANSBRONCHIAL BX OF LYMPH  NODES 7, 10 R AND 10 L.;  Surgeon: Grace Isaac, MD;  Location: [facility] OR;  Service: Thoracic;  Laterality: N/A;    Her Family History Is Significant For: Family History  Problem Relation Age of Onset   Hypertension Mother    Diabetes Mother    Hyperthyroidism Mother    Sleep apnea Mother    Hypertension Father    Heart disease Father    Hypothyroidism Father    Cardiomyopathy Father    Other Father 71       CAUSE OF DEATH..CEREBRAL HEMORR   Sleep apnea Sister    Diabetes Maternal Grandmother    Hypertension Maternal Grandmother    Heart disease  Maternal Grandmother        MI   Other Maternal Grandmother        GRAVES DISEASE   Cardiomyopathy Maternal Grandmother    Other Maternal Grandfather        AAA   Cardiomyopathy Maternal Grandfather    Heart disease Maternal Grandfather        MI   Cancer Paternal Grandmother        BREAST, OTHER UNSPECIFIED SITE   Breast cancer Paternal Grandmother    Hypertension Paternal Grandfather    Stroke Paternal Grandfather        ISCHEMIC STROKE   Other Maternal Aunt        THYROID DISEASE   Heart disease Paternal Aunt        CABG   Cardiomyopathy Paternal Aunt    Cancer Paternal Aunt        STOMACH   Cardiomyopathy Paternal Uncle    Heart disease Paternal Uncle        CABG   Other Cousin        THYROID    Breast cancer Cousin     Her Social History Is Significant For: Social History   Socioeconomic History   Marital status: Single    Spouse name: Not on file   Number of children: Not on file   Years of education: Not on file   Highest education level: Not on file  Occupational History   Not on file  Tobacco Use   Smoking status: Never   Smokeless tobacco: Never  Vaping Use   Vaping Use: Never used  Substance and Sexual Activity   Alcohol use: No   Drug use: No   Sexual activity: Never    Partners: Male  Other Topics Concern   Not on file  Social History Narrative   Right handed    Caffiene soda small can, sweet tea in evening.  During day drinking water.   Education: college dr   Child care.    Social Determinants of Health   Financial Resource Strain: Not on file  Food Insecurity: Not on file  Transportation Needs: Not on file  Physical Activity: Not on file  Stress: Not on file  Social Connections: Not on file    Her Allergies Are:  Allergies  Allergen Reactions   Amoxicillin Hives    Has patient had a PCN reaction causing immediate rash, facial/tongue/throat swelling, SOB or lightheadedness with hypotension: UNKNOWN  Has patient had a PCN  reaction causing severe rash involving mucus membranes or skin necrosis:  #  #  #  NO  #  #  #  Has patient had a PCN reaction that required hospitalization  #  #  #  NO  #  #  #  Has patient had a  PCN reaction occurring within the last 10 years: #  #  #  YES  #  #  #  If all "NO's", may proceed with Cephalosporin use.   Doxycycline Hives   Indocin [Indomethacin] Hives   Levaquin [Levofloxacin] Hives   Probiotic [Acidophilus] Hives   Bentyl [Dicyclomine Hcl] Diarrhea   Erythromycin Nausea Only    STOMACH CRAMPS   Keflex [Cephalexin] Rash   Lansoprazole Other (See Comments)    HEADACHE   Nexium [Esomeprazole] Hives and Other (See Comments)    INTOLERANCE >  HEADACHE    Sudafed [Pseudoephedrine Hcl] Other (See Comments)    TACHYCARDIA   Sulfamethoxazole Rash   Tetanus Toxoids Other (See Comments)    LOCAL REACTION...REDNESS/KNOT  :   Her Current Medications Are:  Outpatient Encounter Medications as of 04/01/2022  Medication Sig   acetaminophen (TYLENOL) 500 MG tablet Take 500-1,000 mg every 8 (eight) hours as needed by mouth for mild pain (depends on pain if takes 12 tablets).    albuterol (PROAIR HFA) 108 (90 Base) MCG/ACT inhaler Inhale 2 puffs into the lungs every 4 (four) hours as needed for wheezing or shortness of breath.   aluminum-magnesium hydroxide-simethicone (MAALOX) [id number] MG/5ML SUSP Take 30 mLs by mouth 2 (two) times daily as needed.   Artificial Tear Solution (SOOTHE XP OP) Apply 2 drops at bedtime to eye.   Biotin 10000 MCG TABS Take 1 tablet by mouth daily.   Cholecalciferol (VITAMIN D3) 5000 units CAPS Take 5,000 Units daily by mouth.    doxepin (SINEQUAN) 10 MG capsule Take 40-60 mg at bedtime by mouth. Depends on IBS symptoms and migraines if takes 4-6   frovatriptan (FROVA) 2.5 MG tablet Take 2.5 mg as needed by mouth for migraine (take 1 tablet at onset of migraine and repeat up to twice if needed). If recurs, may repeat after 2 hours. Max of 3 tabs in 24  hours.    loperamide (IMODIUM) 2 MG capsule Take 2-4 mg as needed by mouth for diarrhea or loose stools.   loratadine (CLARITIN) 10 MG tablet Take 10 mg by mouth daily.   losartan (COZAAR) 25 MG tablet Take 25 mg by mouth 2 (two) times daily.    Multiple Vitamin (MULTIVITAMIN) tablet Take 1 tablet every other day by mouth.    pyridOXINE (VITAMIN B-6) 100 MG tablet Take by mouth.   sodium chloride (OCEAN) 0.65 % SOLN nasal spray Place 1 spray as needed into both nostrils for congestion.   No facility-administered encounter medications on file as of 04/01/2022.  :   Review of Systems:  Out of a complete 14 point review of systems, all are reviewed and negative with the exception of these symptoms as listed below:   Review of Systems  Neurological:        Daytime excessive sleepiness. Fatigued all the time.  Mother and sister have sleep apnea. No previous sleep study or cpap. ESS  7,  FSS 31.    Objective:  Neurological Exam  Physical Exam Physical Examination:   Vitals:   04/01/22 0840  BP: 119/82  Pulse: 72    General Examination: The patient is a very pleasant 56 y.o. female in no acute distress. She appears well-developed and well-nourished and well groomed.   HEENT: Patient wore a facemask during the visit but removed the mask briefly for airway examination.  Normocephalic, atraumatic, pupils are equal, round and reactive to light, extraocular tracking is good without limitation to gaze excursion or nystagmus noted. Hearing  is grossly intact. Face is symmetric with normal facial animation. Speech is clear with no dysarthria noted. There is no hypophonia. There is no lip, neck/head, jaw or voice tremor. Neck is supple with full range of passive and active motion. There are no carotid bruits on auscultation. Oropharynx exam reveals: mild mouth dryness, adequate dental hygiene and mild airway crowding, due to small airway entry, Mallampati class I, slightly elongated uvula and  redundant soft palate.  Neck circumference of 14-7/8 inches.  Minimal overbite.  Tonsils on the small side.  Tongue protrudes centrally and palate elevates symmetrically.  Chest: Clear to auscultation without wheezing, rhonchi or crackles noted.  Heart: S1+S2+0, regular and normal without murmurs, rubs or gallops noted.   Abdomen: Soft, non-tender and non-distended.  Extremities: There is no pitting edema in the distal lower extremities bilaterally.   Skin: Warm and dry without trophic changes noted.   Musculoskeletal: exam reveals no obvious joint deformities.   Neurologically:  Mental status: The patient is awake, alert and oriented in all 4 spheres. Her immediate and remote memory, attention, language skills and fund of knowledge are appropriate. There is no evidence of aphasia, agnosia, apraxia or anomia. Speech is clear with normal prosody and enunciation. Thought process is linear. Mood is normal and affect is normal.  Cranial nerves II - XII are as described above under HEENT exam.  Motor exam: Normal bulk, strength and tone is noted. There is no obvious action or resting tremor.  Fine motor skills and coordination: grossly intact.  Cerebellar testing: No dysmetria or intention tremor. There is no truncal or gait ataxia.  Sensory exam: intact to light touch in the upper and lower extremities.  Gait, station and balance: She stands easily. No veering to one side is noted. No leaning to one side is noted. Posture is age-appropriate and stance is narrow based. Gait shows normal stride length and normal pace. No problems turning are noted.   Assessment and plan:  In summary, Audris H Lorraine is a very pleasant 56 y.o.-year old female with an underlying medical history of irritable bowel syndrome, migraine headaches, paresthesias, allergies, vitamin D deficiency, anxiety, fibromyalgia, hypertension, and vertigo, whose history and examination are concerning for underlying obstructive sleep  apnea.  She reports a family history of sleep apnea.  We talked about sleep hygiene quite a bit today.  She is advised that sleep deprivation is a major cause for sleepiness during the day and many instances and she does not quite get the recommended 7-1/2 to 8-1/2 hours of sleep on any given night.  She is advised to try to work on advancing her bedtime a little bit and avoid caffeine after 3 PM.  This may also be a contributor to not going to bed until late.  She reports that she gets home late often and that she chooses to go to bed later.  She is encouraged to try to move up the doxepin to 9:15 PM or 10:15 PM and try to plan for bedtime around 10:15 PM.  Furthermore, she is encouraged not to watch TV while in bed or in the bedroom and not close to bedtime.  She is encouraged to avoid any electronics 1 or 2 hours before bedtime.  We will plan a sleep study.  She would like to pursue a home sleep test rather than a lab study.   She is advised that a laboratory attended sleep study is considered more comprehensive and accurate vs a home sleep test (HST); the  latter may lead to underestimation of sleep disordered breathing in some instances and does not help with diagnosing upper airway resistance syndrome and is not accurate enough to diagnose primary central sleep apnea typically.  As per her preference I will order a home sleep test.  We talked about treatment options for sleep apnea.  I also explained the risks and ramifications of untreated moderate to severe obstructive sleep apnea particularly with respect to cardiovascular complications.  We will keep her posted as to her test results with a virtual visit appointment or phone call appointment or phone call.  We will plan to follow-up accordingly.  I answered all her questions today and she was in agreement.  Thank you very much for allowing me to participate in the care of this nice patient. If I can be of any further assistance to you please do not  hesitate to talk to me.   Sincerely,   Star Age, MD, PhD

## 2022-05-25 ENCOUNTER — Telehealth: Payer: Self-pay | Admitting: Neurology

## 2022-05-25 NOTE — Telephone Encounter (Signed)
[name] is scheduled for 06/23/22 at 10 AM.  Mailed packet to the patient.

## 2022-05-27 NOTE — Telephone Encounter (Signed)
HST- [employer] Berkley Harvey: [id number] (exp. 05/27/22 to 07/25/22)

## 2022-06-23 ENCOUNTER — Ambulatory Visit: Payer: BC Managed Care – PPO | Admitting: Neurology

## 2022-06-23 DIAGNOSIS — Z820 Family history of epilepsy and other diseases of the nervous system: Secondary | ICD-10-CM

## 2022-06-23 DIAGNOSIS — R0683 Snoring: Secondary | ICD-10-CM

## 2022-06-23 DIAGNOSIS — G4719 Other hypersomnia: Secondary | ICD-10-CM

## 2022-06-23 DIAGNOSIS — R351 Nocturia: Secondary | ICD-10-CM

## 2022-06-23 DIAGNOSIS — G4733 Obstructive sleep apnea (adult) (pediatric): Secondary | ICD-10-CM | POA: Diagnosis not present

## 2022-06-24 NOTE — Progress Notes (Unsigned)
[facility]  HOME SLEEP TEST (Watch PAT) REPORT  STUDY DATE: 06/23/2022  DOB: 1965-08-22  MRN: [id number]  ORDERING CLINICIAN: Huston Foley, MD, PhD   REFERRING CLINICIAN: Chow, Zannie Cove, MD (PCP), Dr. Marjory Lies (Neuro)  CLINICAL INFORMATION/HISTORY: 56 year old female with an underlying medical history of irritable bowel syndrome, migraine headaches, paresthesias, allergies, vitamin D deficiency, anxiety, fibromyalgia, hypertension, and vertigo, who reports feeling tired all the time.  She reports that her twin sister recently got diagnosed with sleep apnea and her mom has sleep apnea.     Epworth sleepiness score: 7/24.  BMI: 23.5 kg/m  FINDINGS:   Sleep Summary:   Total Recording Time (hours, min): 7 hours, 59 min  Total Sleep Time (hours, min):  5 hours, 50 min  Percent REM (%):    24.3%   Respiratory Indices:   Calculated pAHI (per hour):  11/hour         REM pAHI:    7.8/hour       NREM pAHI: 12/hour  Central pAHI: 1/hour  Oxygen Saturation Statistics:    Oxygen Saturation (%) Mean: 95%   Minimum oxygen saturation (%):                 91%   O2 Saturation Range (%): 91-98%    O2 Saturation (minutes) <=88%: 0 min  Pulse Rate Statistics:   Pulse Mean (bpm):    66/min    Pulse Range (45-104/min)   IMPRESSION: OSA (obstructive sleep apnea), mild  RECOMMENDATION:  This home sleep test demonstrates overall mild obstructive sleep apnea - by number of events - with a total AHI of ***/hour and O2 nadir of ***%. Snoring was detected, ***. Given the patient's medical history and sleep related complaints, therapy with a  positive airway pressure device is a reasonable first-line choice and clinically recommended. Treatment can be achieved in the form of autoPAP trial/titration at home for now. A full night, in-lab PAP titration study may aid in improving proper treatment settings and with mask fit, if needed, down the road. Alternative  treatments may include weight loss (where appropriate) along with avoidance of the supine sleep position (if possible), or an oral appliance in appropriate candidates.   Please note that untreated obstructive sleep apnea may carry additional perioperative morbidity. Patients with significant obstructive sleep apnea should receive perioperative PAP therapy and the surgeons and particularly the anesthesiologist should be informed of the diagnosis and the severity of the sleep disordered breathing. The patient should be cautioned not to drive, work at heights, or operate dangerous or heavy equipment when tired or sleepy. Review and reiteration of good sleep hygiene measures should be pursued with any patient. Other causes of the patient's symptoms, including circadian rhythm disturbances, an underlying mood disorder, medication effect and/or an underlying medical problem cannot be ruled out based on this test. Clinical correlation is recommended.  The patient and *** referring provider will be notified of the test results. The patient will be seen in follow up in sleep clinic at [facility], as necessary.  I certify that I have reviewed the raw data recording prior to the issuance of this report in accordance with the standards of the American Academy of Sleep Medicine (AASM).    INTERPRETING PHYSICIAN:   Huston Foley, MD, PhD Medical Director, [facility]) Diplomat, ABPN (Neurology and Sleep)   [facility] [address] [place], [place] [address] [phone]

## 2022-07-01 NOTE — Procedures (Signed)
   [facility]  HOME SLEEP TEST (Watch PAT) REPORT  STUDY DATE: 06/23/2022  DOB: 08/25/1965  MRN: [id number]  ORDERING CLINICIAN: Star Age, MD, PhD   REFERRING CLINICIAN: Chow, Bebe Shaggy, MD (PCP), Dr. Leta Baptist (Neuro)  CLINICAL INFORMATION/HISTORY: 57 year old female with an underlying medical history of irritable bowel syndrome, migraine headaches, paresthesias, allergies, vitamin D deficiency, anxiety, fibromyalgia, hypertension, and vertigo, who reports feeling tired all the time.  She reports that her twin sister recently got diagnosed with sleep apnea and her mom has sleep apnea.     Epworth sleepiness score: 7/24.  BMI: 23.5 kg/m  FINDINGS:   Sleep Summary:   Total Recording Time (hours, min): 7 hours, 59 min  Total Sleep Time (hours, min):  5 hours, 50 min  Percent REM (%):    24.3%   Respiratory Indices:   Calculated pAHI (per hour):  11/hour         REM pAHI:    7.8/hour       NREM pAHI: 12/hour  Central pAHI: 1/hour  Oxygen Saturation Statistics:    Oxygen Saturation (%) Mean: 95%   Minimum oxygen saturation (%):                 91%   O2 Saturation Range (%): 91-98%    O2 Saturation (minutes) <=88%: 0 min  Pulse Rate Statistics:   Pulse Mean (bpm):    66/min    Pulse Range (45-104/min)   IMPRESSION: OSA (obstructive sleep apnea), mild  RECOMMENDATION:  This home sleep test demonstrates overall mild obstructive sleep apnea - by number of events - with a total AHI of 11/hour and O2 nadir of 91%. Snoring was detected, mostly intermittent, in the mild to moderate range, rarely louder. Given the patient's medical history and sleep related complaints, therapy with a positive airway pressure device such as AutoPap can be considered, treatment for mild sleep apnea particularly without serious desaturations is not imperative. Alternative treatments may include weight loss (where appropriate) along with avoidance of the supine sleep  position (if possible), or an oral appliance in appropriate candidates.  These options will be discussed with the patient. The patient should be cautioned not to drive, work at heights, or operate dangerous or heavy equipment when tired or sleepy. Review and reiteration of good sleep hygiene measures should be pursued with any patient. Other causes of the patient's symptoms, including circadian rhythm disturbances, an underlying mood disorder, medication effect and/or an underlying medical problem cannot be ruled out based on this test. Clinical correlation is recommended.  The patient and her referring provider will be notified of the test results. The patient will be seen in follow up in sleep clinic at [facility], as necessary.  I certify that I have reviewed the raw data recording prior to the issuance of this report in accordance with the standards of the American Academy of Sleep Medicine (AASM).  INTERPRETING PHYSICIAN:   Star Age, MD, PhD Medical Director, [facility]) [place], ABPN (Neurology and Sleep)   [facility] [address] [place], [place] [address] [phone]

## 2022-07-15 ENCOUNTER — Telehealth: Payer: Self-pay | Admitting: *Deleted

## 2022-07-15 NOTE — Telephone Encounter (Signed)
-----  Message from Saima Athar, MD sent at 07/01/2022  8:09 PM EST ----- Patient referred by Dr. Penumalli, seen by me on 04/01/2022, HST on 06/23/2022.   Please call and notify the patient that the recent home sleep test did not show any significant obstructive sleep apnea.  She has very mild sleep apnea with an AHI of 11/h, oxygen remained at or above 91% for the night, she did have intermittent snoring.  We can consider AutoPap therapy, her insurance may or may not cover it however.  We can also consider a referral to dentistry for mild sleep apnea.  Avoidance of the back sleep position may aid in reducing her snoring and mild sleep apnea.  She does not need to lose weight of course.  Let me know how she would like to proceed, we would be happy to facilitate with a referral to dentistry or consider AutoPap therapy if she would like.   Thanks,  Saima Athar, MD, PhD [facility] ([facility]   

## 2022-07-15 NOTE — Telephone Encounter (Signed)
Called pt & LVM asking for call back to discuss sleep study results. Left office number in message.  

## 2022-07-16 ENCOUNTER — Telehealth: Payer: Self-pay | Admitting: *Deleted

## 2022-07-16 DIAGNOSIS — G4733 Obstructive sleep apnea (adult) (pediatric): Secondary | ICD-10-CM

## 2022-07-16 NOTE — Telephone Encounter (Signed)
Fax confirmation received.

## 2022-07-16 NOTE — Telephone Encounter (Signed)
I called Kristin [name]. I advised Kristin [name] that Dr. Rexene Alberts reviewed their sleep study results and found that Kristin [name] has mild OSA. Dr. Rexene Alberts recommends that Kristin [name] can start autopap, or she can use a oral dental device. . I reviewed PAP compliance expectations with the Kristin [name]. Kristin [name] is agreeable to starting an auto-PAP. I advised Kristin [name] that an order will be sent to a DME, [facility] , and they will call the Kristin [name] within about one week after they file with the Kristin [name]'s insurance. If Kristin [name] decides to go with using machine they will show the Kristin [name] how to use the machine, fit for masks, and troubleshoot the auto-PAP if needed. A follow up appt will made for insurance purposes with our office.  Kristin [name] verbalized understanding of results. Kristin [name] had no questions at this time but was encouraged to call back if questions arise. I have sent the order to [facility] and have received confirmation that they have received the order.

## 2022-07-16 NOTE — Telephone Encounter (Signed)
Order signed for autoPAP.

## 2022-07-16 NOTE — Telephone Encounter (Signed)
-----  Message from Star Age, MD sent at 07/01/2022  8:09 PM EST ----- Patient referred by Dr. Leta Baptist, seen by me on 04/01/2022, HST on 06/23/2022.   Please call and notify the patient that the recent home sleep test did not show any significant obstructive sleep apnea.  She has very mild sleep apnea with an AHI of 11/h, oxygen remained at or above 91% for the night, she did have intermittent snoring.  We can consider AutoPap therapy, her insurance may or may not cover it however.  We can also consider a referral to dentistry for mild sleep apnea.  Avoidance of the back sleep position may aid in reducing her snoring and mild sleep apnea.  She does not need to lose weight of course.  Let me know how she would like to proceed, we would be happy to facilitate with a referral to dentistry or consider AutoPap therapy if she would like.   Thanks,  Star Age, MD, PhD [facility])

## 2022-08-03 NOTE — Telephone Encounter (Signed)
I called pt LMVM for her at [employer] then work.  She is to call back.

## 2022-08-03 NOTE — Telephone Encounter (Signed)
Pt has not been able to make contact with DME she is called the [phone]  with no success in reaching anyone about her CPAP, please call pt with assistance.

## 2022-08-04 NOTE — Telephone Encounter (Signed)
I called pt and LMVM for her [employer] that she is to try the # again [phone] and they should pick up.  They had tried to contact you as well.  Call us back if needed.

## 2022-08-11 ENCOUNTER — Emergency Department (HOSPITAL_BASED_OUTPATIENT_CLINIC_OR_DEPARTMENT_OTHER)
Admission: EM | Admit: 2022-08-11 | Discharge: 2022-08-11 | Disposition: A | Discharge status: Home / Self Care | Payer: BC Managed Care – PPO | Attending: Emergency Medicine | Admitting: Emergency Medicine

## 2022-08-11 ENCOUNTER — Other Ambulatory Visit: Payer: Self-pay

## 2022-08-11 ENCOUNTER — Emergency Department (HOSPITAL_BASED_OUTPATIENT_CLINIC_OR_DEPARTMENT_OTHER): Payer: BC Managed Care – PPO

## 2022-08-11 ENCOUNTER — Encounter (HOSPITAL_BASED_OUTPATIENT_CLINIC_OR_DEPARTMENT_OTHER): Payer: Self-pay

## 2022-08-11 DIAGNOSIS — R0789 Other chest pain: Secondary | ICD-10-CM | POA: Insufficient documentation

## 2022-08-11 DIAGNOSIS — R079 Chest pain, unspecified: Secondary | ICD-10-CM | POA: Diagnosis present

## 2022-08-11 LAB — BASIC METABOLIC PANEL
Anion gap: 11 (ref 5–15)
BUN: 20 mg/dL (ref 6–20)
CO2: 25 mmol/L (ref 22–32)
Calcium: 9.8 mg/dL (ref 8.9–10.3)
Chloride: 104 mmol/L (ref 98–111)
Creatinine, Ser: 0.71 mg/dL (ref 0.44–1.00)
GFR, Estimated: >60 mL/min (ref >60)
Glucose, Bld: 86 mg/dL (ref 70–99)
Potassium: 4.1 mmol/L (ref 3.5–5.1)
Sodium: 140 mmol/L (ref 135–145)

## 2022-08-11 LAB — CBC
HCT: 42.3 % (ref 36.0–46.0)
Hemoglobin: 14.2 g/dL (ref 12.0–15.0)
MCH: 28.3 pg (ref 26.0–34.0)
MCHC: 33.6 g/dL (ref 30.0–36.0)
MCV: 84.4 fL (ref 80.0–100.0)
Platelets: 225 10*3/uL (ref 150–400)
RBC: 5.01 MIL/uL (ref 3.87–5.11)
RDW: 13.2 % (ref 11.5–15.5)
WBC: 8 10*3/uL (ref 4.0–10.5)
nRBC: 0 % (ref 0.0–0.2)

## 2022-08-11 LAB — HEPATIC FUNCTION PANEL
ALT: 18 U/L (ref 0–44)
AST: 19 U/L (ref 15–41)
Albumin: 4.5 g/dL (ref 3.5–5.0)
Alkaline Phosphatase: 66 U/L (ref 38–126)
Bilirubin, Direct: <0.1 mg/dL (ref 0.0–0.2)
Total Bilirubin: 0.5 mg/dL (ref 0.3–1.2)
Total Protein: 7.4 g/dL (ref 6.5–8.1)

## 2022-08-11 LAB — TROPONIN I (HIGH SENSITIVITY)
Troponin I (High Sensitivity): <2 ng/L (ref <18)
Troponin I (High Sensitivity): <2 ng/L (ref <18)

## 2022-08-11 MED ORDER — IOHEXOL 350 MG/ML SOLN
100.0000 mL | Freq: Once | INTRAVENOUS | Status: AC | PRN
  Administered 2022-08-11: 74 mL via INTRAVENOUS

## 2022-08-11 NOTE — ED Provider Notes (Signed)
[facility] Provider Note   CSN: [id number] Arrival date & time: 08/11/22  [id number]     History  Chief Complaint  Patient presents with   Chest Pain    Kristin [name] is a 57 y.o. female.  57 year old female with no past medical history presents to the ED with a chief complaint of substernal chest pain, describing it as sharp and stabbing radiating through her back between her shoulder blades.  She reports the first episode occurred last Thursday and lasted about 25 minutes, she felt sick to her stomach, clammy and sweaty when this occurred.  Patient also reports she ate around noon and then the pain began 3 hours later, she cannot correlate whether this was due to food.  She does report today she felt like she did not feel good waking up this morning, has a history of fatigue however feels more fatigued than usual.  She does have an ongoing family history of aneurysms, with father having abdominal aortic aneurysm in the past.  She also experienced the same pain yesterday but it did not last long.  She denies any fever, no nausea, no vomiting.  No prior history of CAD, does have a strong family history of heart disease, non-smoker.   The history is provided by the patient and medical records.  Chest Pain Pain location:  Substernal area Associated symptoms: nausea and shortness of breath   Associated symptoms: no abdominal pain, no fever and no vomiting        Home Medications Prior to Admission medications   Medication Sig Start Date End Date Taking? Authorizing Provider  acetaminophen (TYLENOL) 500 MG tablet Take 500-1,000 mg every 8 (eight) hours as needed by mouth for mild pain (depends on pain if takes 12 tablets).     [provider]  albuterol (PROAIR HFA) 108 (90 Base) MCG/ACT inhaler Inhale 2 puffs into the lungs every 4 (four) hours as needed for wheezing or shortness of breath. 06/02/19 04/01/22  Candee Furbish, MD   aluminum-magnesium hydroxide-simethicone (MAALOX) 200-200-20 MG/5ML SUSP Take 30 mLs by mouth 2 (two) times daily as needed.    [provider]  Artificial Tear Solution (SOOTHE XP OP) Apply 2 drops at bedtime to eye.    [provider]  Biotin 10000 MCG TABS Take 1 tablet by mouth daily.    [provider]  Cholecalciferol (VITAMIN D3) 5000 units CAPS Take 5,000 Units daily by mouth.     [provider]  doxepin (SINEQUAN) 10 MG capsule Take 40-60 mg at bedtime by mouth. Depends on IBS symptoms and migraines if takes 4-6    [provider]  frovatriptan (FROVA) 2.5 MG tablet Take 2.5 mg as needed by mouth for migraine (take 1 tablet at onset of migraine and repeat up to twice if needed). If recurs, may repeat after 2 hours. Max of 3 tabs in 24 hours.     [provider]  loperamide (IMODIUM) 2 MG capsule Take 2-4 mg as needed by mouth for diarrhea or loose stools.    [provider]  loratadine (CLARITIN) 10 MG tablet Take 10 mg by mouth daily.    [provider]  losartan (COZAAR) 25 MG tablet Take 25 mg by mouth 2 (two) times daily.     [provider]  Multiple Vitamin (MULTIVITAMIN) tablet Take 1 tablet every other day by mouth.     [provider]  pyridOXINE (VITAMIN B-6) 100 MG tablet  Take by mouth.    [provider]  sodium chloride (OCEAN) 0.65 % SOLN nasal spray Place 1 spray as needed into both nostrils for congestion.    [provider]      Allergies    Amoxicillin, Doxycycline, Indocin [indomethacin], Levaquin [levofloxacin], Probiotic [acidophilus], Bentyl [dicyclomine hcl], Erythromycin, Keflex [cephalexin], Lansoprazole, Nexium [esomeprazole], Sudafed [pseudoephedrine hcl], Sulfamethoxazole, and Tetanus toxoids    Review of Systems   Review of Systems  Constitutional:  Negative for chills and fever.  HENT:  Negative for sore throat.   Respiratory:  Positive for  shortness of breath.   Cardiovascular:  Positive for chest pain.  Gastrointestinal:  Positive for nausea. Negative for abdominal pain and vomiting.  Genitourinary:  Negative for flank pain.  All other systems reviewed and are negative.   Physical Exam Updated Vital Signs BP (!) 155/105 (BP Location: Right Arm)   Pulse 82   Temp 98.3 F (36.8 C)   Resp 16   SpO2 99%  Physical Exam Vitals and nursing note reviewed.  Constitutional:      General: She is not in acute distress.    Appearance: She is well-developed.  HENT:     Head: Normocephalic and atraumatic.     Mouth/Throat:     Pharynx: No oropharyngeal exudate.  Eyes:     Pupils: Pupils are equal, round, and reactive to light.  Cardiovascular:     Rate and Rhythm: Regular rhythm.     Heart sounds: Normal heart sounds.  Pulmonary:     Effort: Pulmonary effort is normal. No respiratory distress.     Breath sounds: Normal breath sounds.  Abdominal:     General: Bowel sounds are normal. There is no distension.     Palpations: Abdomen is soft.     Tenderness: There is no abdominal tenderness.  Musculoskeletal:        General: No tenderness or deformity.     Cervical back: Normal range of motion.     Right lower leg: No edema.     Left lower leg: No edema.  Skin:    General: Skin is warm and dry.  Neurological:     Mental Status: She is alert and oriented to person, place, and time.     ED Results / Procedures / Treatments   Labs (all labs ordered are listed, but only abnormal results are displayed) Labs Reviewed  BASIC METABOLIC PANEL  CBC  HEPATIC FUNCTION PANEL  TROPONIN I (HIGH SENSITIVITY)  TROPONIN I (HIGH SENSITIVITY)    EKG EKG Interpretation  Date/Time:  Tuesday August 11 2022 18:48:16 EST Ventricular Rate:  75 PR Interval:  144 QRS Duration: 80 QT Interval:  364 QTC Calculation: 406 R Axis:   72 Text Interpretation: Normal sinus rhythm Normal ECG When compared with ECG of 14-May-2017 16:18,  No significant change was found Confirmed by Dorie Rank [phone]) on 08/11/2022 9:00:35 PM  Radiology US Abdomen Limited  Result Date: 08/11/2022 CLINICAL DATA:  Midsternal chest pain radiating to back. EXAM: ULTRASOUND ABDOMEN LIMITED RIGHT UPPER QUADRANT COMPARISON:  08/11/2022. FINDINGS: Gallbladder: No gallstones or wall thickening visualized. No sonographic Murphy sign noted by sonographer. Common bile duct: Diameter: 4.3 mm Liver: No focal lesion identified. Within normal limits in parenchymal echogenicity. Portal vein is patent on color Doppler imaging with normal direction of blood flow towards the liver. Other: No free fluid. IMPRESSION: Normal exam. Electronically Signed   By: Brett Fairy M.D.   On: 08/11/2022 21:38   CT Angio  Chest/Abd/Pel for Dissection W and/or Wo Contrast  Result Date: 08/11/2022 CLINICAL DATA:  Aortic aneurysm suspected. Midsternal chest pain radiating to the back. EXAM: CT ANGIOGRAPHY CHEST, ABDOMEN AND PELVIS TECHNIQUE: Non-contrast CT of the chest was initially obtained. Multidetector CT imaging through the chest, abdomen and pelvis was performed using the standard protocol during bolus administration of intravenous contrast. Multiplanar reconstructed images and MIPs were obtained and reviewed to evaluate the vascular anatomy. RADIATION DOSE REDUCTION: This exam was performed according to the departmental dose-optimization program which includes automated exposure control, adjustment of the mA and/or kV according to patient size and/or use of iterative reconstruction technique. CONTRAST:  60m OMNIPAQUE IOHEXOL 350 MG/ML SOLN COMPARISON:  CT chest 01/26/2020.  PET-CT 05/05/2017 FINDINGS: CTA CHEST FINDINGS Cardiovascular: Unenhanced images of the chest demonstrate no significant aortic calcification. No evidence of intramural hematoma. Images obtained during arterial phase after contrast material was administered intravenously demonstrate normal caliber patent thoracic  aorta. No evidence of aortic dissection or aneurysm. Motion artifact in the ascending aorta. Great vessel origins are patent. 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 Normal heart size. No pericardial effusions. Suboptimal opacification of the pulmonary arteries makes the examination indeterminate for evaluation of pulmonary artery patency. Mediastinum/Nodes: Esophagus is decompressed. No significant lymphadenopathy. Heterogeneous hypoenhancing right thyroid nodule measuring 1.6 cm diameter. Small focal calcification. No change since prior CT and PET-CT studies. Stability since at least 2018 suggests benign etiology. Lungs/Pleura: Scattered emphysematous changes throughout the lungs. No airspace disease or consolidation. No pleural effusions. No pneumothorax. Focal area of scarring in the subpleural left lingula. Musculoskeletal: No chest wall abnormality. No acute or significant osseous findings. Review of the MIP images confirms the above findings. CTA ABDOMEN AND PELVIS FINDINGS VASCULAR Aorta: Normal caliber aorta without aneurysm, dissection, vasculitis or significant stenosis. Celiac: Patent without evidence of aneurysm, dissection, vasculitis or significant stenosis. SMA: Patent without evidence of aneurysm, dissection, vasculitis or significant stenosis. Renals: Both renal arteries are patent without evidence of aneurysm, dissection, vasculitis, fibromuscular dysplasia or significant stenosis. IMA: Patent without evidence of aneurysm, dissection, vasculitis or significant stenosis. Inflow: Patent without evidence of aneurysm, dissection, vasculitis or significant stenosis. Veins: No obvious venous abnormality within the limitations of this arterial phase study. Review of the MIP images confirms the above findings. NON-VASCULAR Hepatobiliary: No focal liver abnormality is seen. No gallstones, gallbladder wall thickening, or biliary dilatation. Pancreas:  Unremarkable. No pancreatic ductal dilatation or surrounding inflammatory changes. Spleen: Normal in size without focal abnormality. Adrenals/Urinary Tract: Adrenal glands are unremarkable. Kidneys are normal, without renal calculi, focal lesion, or hydronephrosis. Bladder is unremarkable. Stomach/Bowel: Stomach is within normal limits. Appendix appears normal. No evidence of bowel wall thickening, distention, or inflammatory changes. Lymphatic: No significant lymphadenopathy. Reproductive: Status post hysterectomy. No adnexal masses. Other: No abdominal wall hernia or abnormality. No abdominopelvic ascites. Musculoskeletal: No acute or significant osseous findings. Review of the MIP images confirms the above findings. IMPRESSION: 1. No evidence of thoracic or abdominal aortic aneurysm or dissection. No large vessel occlusion. 2. No evidence of active pulmonary disease. 3. No acute process demonstrated in the abdomen or pelvis. 4. 1.6 cm right thyroid nodule is unchanged since prior studies. This has been evaluated on previous imaging. (ref: J Am Coll Radiol. 2015 Feb;12(2): 143-50). Electronically Signed   By: WLucienne CapersM.D.   On: 08/11/2022 21:27   DG Chest Port 1 View  Result Date: 08/11/2022 CLINICAL DATA:  Chest pain EXAM: PORTABLE CHEST 1 VIEW COMPARISON:  Chest x-ray 04/14/2018 FINDINGS: Small nodular density projects over the left costophrenic angle. The lungs are otherwise clear. There is no pleural effusion or pneumothorax. No acute fractures are seen. IMPRESSION: Small nodular density projects over the left costophrenic angle. This may represent a nipple shadow or pulmonary nodule. Recommend repeat chest radiograph with nipple markers. Electronically Signed   By: Ronney Asters M.D.   On: 08/11/2022 20:13    Procedures Procedures    Medications Ordered in ED Medications  iohexol (OMNIPAQUE) 350 MG/ML injection 100 mL (74 mLs Intravenous Contrast Given 08/11/22 2105)    ED Course/  Medical Decision Making/ A&P                             Medical Decision Making Amount and/or Complexity of Data Reviewed Labs: ordered. Radiology: ordered.  Risk Prescription drug management.    This patient presents to the ED for concern of chest pain, this involves a number of treatment options, and is a complaint that carries with it a high risk of complications and morbidity.  The differential diagnosis includes cholecystitis, cholelithiasis, ACS, pulmonary embolism versus aortic dissection.   Co morbidities: Discussed in HPI   Brief History:  Patient here with 5 days of substernal chest pain radiating through her shoulder blades.  Had episode where she felt somewhat short of breath.  Evaluated by PCP and sent here to further rule out aortic dissection.  EMR reviewed including pt PMHx, past surgical history and past visits to ER.   See HPI for more details   Lab Tests:  I ordered and independently interpreted labs.  The pertinent results include:    I personally reviewed all laboratory work and imaging. Metabolic panel without any acute abnormality specifically kidney function within normal limits and no significant electrolyte abnormalities. CBC without leukocytosis or significant anemia.  Imaging Studies:  NAD. I personally reviewed all imaging studies and no acute abnormality found. I agree with radiology interpretation.  Cardiac Monitoring:  The patient was maintained on a cardiac monitor.  I personally viewed and interpreted the cardiac monitored which showed an underlying rhythm of: NSR EKG non-ischemic   Medicines ordered:  N/A   Reevaluation:  After the interventions noted above I re-evaluated patient and found that they have :stayed the same   Social Determinants of Health:  The patient's social determinants of health were a factor in the care of this patient  Problem List / ED Course:  Patient here with chest pain substernal sharp  radiating to her shoulder blades which has been ongoing for the past 5 days.  Had multiple episodes evaluated by PCP and sent here to rule out ACS versus aortic dissection.  She does have a strong family history of CAD, does have her first relative who had a abdominal aortic aneurysm, however she has not had any prior aneurysms in the past.  No history of ACS.  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  Some concern for gallbladder pathology as she reports the pain is several hours after she eats, however ultrasound obtained did not show any acute findings.  LFTs were within normal limits, she is afebrile here I do not suspect gallbladder pathology at this time.  Chest x-ray did not show any acute finding such as pneumonia, to account for patient's symptoms.  CT angio chest was obtained without any signs of dissection or large pulmonary embolism.  She is not  hypoxic, not tachycardic to suggest pulmonary embolism.  EKG normal sinus rhythm and troponins x 2 were within normal limits.  I discussed these results with patient, she was provided with a copy of her CT angio chest.  I did discuss with her appropriate follow-up with cardiology outpatient.  She is agreeable to plan and treatment, patient is hemodynamically stable for discharge.   Dispostion:  After consideration of the diagnostic results and the patients response to treatment, I feel that the patent would benefit from further follow-up with cardiology at her earliest convenience.     Portions of this note were generated with [employer]. Dictation errors may occur despite best attempts at proofreading.   Final Clinical Impression(s) / ED Diagnoses Final diagnoses:  Atypical chest pain    Rx / DC Orders ED Discharge Orders          Ordered    Ambulatory referral to Cardiology        08/11/22 2210              Janeece Fitting, Hershal Coria 08/11/22 2210    Dorie Rank, MD 08/14/22  1016

## 2022-08-11 NOTE — Discharge Instructions (Addendum)
Your CT did not show any acute findings today.  You were provided with a copy of this exam.  You were also given a referral to the cardiology clinic, please schedule an appointment for further management of your chest pain.

## 2022-08-11 NOTE — ED Triage Notes (Signed)
Pt presents with mid-sternum chest pain radiating through to her back, felt clammy, cold and sweaty last Thursday. Lasting approx 25 mins  Pt reports another episode today two hours after eating approx 2pm. Pt reports it was more pain in her back radiating up into her Left shoulder. Associated with "severe" fatigue, unable to push through. PCP sent her to ED for further evaluation

## 2022-08-11 NOTE — ED Notes (Signed)
EKG preformed at 1837 not 1307 as previously charted. B. Yolanda Bonine, EMT

## 2022-08-25 ENCOUNTER — Ambulatory Visit: Payer: BC Managed Care – PPO | Attending: Internal Medicine | Admitting: Internal Medicine

## 2022-08-25 ENCOUNTER — Encounter: Payer: Self-pay | Admitting: Internal Medicine

## 2022-08-25 VITALS — BP 122/70 | HR 90 | Ht 68.0 in | Wt 155.8 lb

## 2022-08-25 DIAGNOSIS — R0602 Shortness of breath: Secondary | ICD-10-CM | POA: Diagnosis not present

## 2022-08-25 DIAGNOSIS — E782 Mixed hyperlipidemia: Secondary | ICD-10-CM

## 2022-08-25 DIAGNOSIS — R072 Precordial pain: Secondary | ICD-10-CM

## 2022-08-25 DIAGNOSIS — D860 Sarcoidosis of lung: Secondary | ICD-10-CM

## 2022-08-25 NOTE — Progress Notes (Signed)
Cardiology Office Note:    Date:  08/25/2022   ID:  Kristin [name], DOB 01-18-1966, MRN [id number]  PCP:  Malena Peer, MD   [place] [facility] Cardiologist:  Werner Lean, MD     Referring MD: Janeece Fitting, PA-C   CC: Chest pain Consulted for the evaluation of chest pain at the behest of MS. [place]  History of Present Illness:    Kristin [name] is a 57 y.o. female with a hx of pulmonary sarcoidosis (2021) with 2018 lymph node biopsy in 2018. HTN.  The Thursday before 08/11/22, was having chest pain that lasted for 2025 minutes.  Sudden onset chest pain that radiated to the back.  It improved until the weekend, then worsened.  Over all felt poorly.  Fatigued and that felt different that her chronic fatigue syndrome.  Saw PCP-> and chest pain work up.  Given her family history of an abdominal aortic aneurysm went to Ed.  No dissection, no CAC.  No PE.  No biliary disease.  Her grandparents have had vascular disease.  Four out of the 5 had CAD.    A cousin had CAD in the past.  Since ED has had low stamina that has persisted. 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 Has a rare palpitations. Has DOE the keeps her from activity.   No PND or orthopnea.  No weight gain, leg swelling , or abdominal swelling. No syncope or near syncope  Waiting for a prescription for reflux.  Past Medical History:  Diagnosis Date   Anxiety    Dizziness    Dysrhythmia    palpitations   Fibromyalgia 04/13/2017   GERD (gastroesophageal reflux disease)    Headache    History of migraine headaches    Hypertension    hx arrhythmia   IBS (irritable bowel syndrome)    Lymphadenopathy    Seasonal allergies    Thyroid nodule    Vertigo    Vitamin D deficiency     Past Surgical History:  Procedure Laterality Date   DILATION AND CURETTAGE OF UTERUS  2006, 2009   EYE SURGERY Bilateral 1997   LASER   THYOID NODULE   05/2009   FNA...DR. Davonna Belling .Marland KitchenMarland KitchenMarland KitchenNEG   VAGINAL HYSTERECTOMY  12/2017   LAPAROSCOPICALLY ASSISTED/DR. ELIZABETH SKINNER   VIDEO ASSISTED THORACOSCOPY (VATS)/ LYMPH NODE SAMPLING Left 05/28/2017   Procedure: LEFT PERISTERNAL EXPLORATION, LEFT VIDEO ASSISTED THORACOSCOPY (VATS), MEDIASTINAL AND AP WINDOW LYMPH NODE DISSECTION;  Surgeon: Grace Isaac, MD;  Location: [facility];  Service: Thoracic;  Laterality: Left;   VIDEO BRONCHOSCOPY WITH ENDOBRONCHIAL ULTRASOUND N/A 05/17/2017   Procedure: VIDEO BRONCHOSCOPY WITH ENDOBRONCHIAL ULTRASOUND WITH TRANSBRONCHIAL BX OF LYMPH NODES 7, 10 R AND 10 L.;  Surgeon: Grace Isaac, MD;  Location: [facility];  Service: Thoracic;  Laterality: N/A;    Current Medications: Current Meds  Medication Sig   acetaminophen (TYLENOL) 500 MG tablet Take 500-1,000 mg every 8 (eight) hours as needed by mouth for mild pain (depends on pain if takes 12 tablets).    albuterol (PROAIR HFA) 108 (90 Base) MCG/ACT inhaler Inhale 2 puffs into the lungs every 4 (four) hours as needed for wheezing or shortness of breath.   aluminum-magnesium hydroxide-simethicone (MAALOX) [id number] MG/5ML SUSP Take 30 mLs by mouth 2 (two) times daily as needed.   Artificial Tear Solution (SOOTHE XP OP) Apply 2 drops at bedtime to eye.  Ascorbic Acid (VITAMIN C) 1000 MG tablet every other day.   Cholecalciferol (VITAMIN D3) 5000 units CAPS Take 5,000 Units daily by mouth.    doxepin (SINEQUAN) 10 MG capsule Take 40-60 mg at bedtime by mouth. Depends on IBS symptoms and migraines if takes 4-6   Ergocalciferol (VITAMIN D2) 10 MCG (400 UNIT) TABS Take 2,000 mg by mouth daily at 6 (six) AM.   frovatriptan (FROVA) 2.5 MG tablet Take 2.5 mg as needed by mouth for migraine (take 1 tablet at onset of migraine and repeat up to twice if needed). If recurs, may repeat after 2 hours. Max of 3 tabs in 24 hours.    loperamide (IMODIUM) 2 MG capsule Take 2-4 mg as needed by mouth for diarrhea or loose  stools.   loratadine (CLARITIN) 10 MG tablet Take 10 mg by mouth as needed for allergies.   losartan (COZAAR) 25 MG tablet Take 25 mg by mouth 2 (two) times daily.    Multiple Vitamin (MULTIVITAMIN) tablet Take 1 tablet by mouth daily.   Omega-3 Fatty Acids (FISH OIL) 1000 MG CAPS 2 capsules daily at 6 (six) AM.   RABEprazole (ACIPHEX) 20 MG tablet daily.   sodium chloride (OCEAN) 0.65 % SOLN nasal spray Place 1 spray as needed into both nostrils for congestion.   tretinoin (RETIN-A) 0.05 % cream daily at 6 (six) AM.     Allergies:   Amoxicillin, Dicyclomine, Doxycycline, Indocin [indomethacin], Levaquin [levofloxacin], Probiotic [acidophilus], Bacillus coagulans-inulin, Bentyl [dicyclomine hcl], Erythromycin, Keflex [cephalexin], Lansoprazole, Nexium [esomeprazole], Sudafed [pseudoephedrine hcl], Sulfamethoxazole, and Tetanus toxoids   Social History   Socioeconomic History   Marital status: Single    Spouse name: Not on file   Number of children: Not on file   Years of education: Not on file   Highest education level: Not on file  Occupational History   Not on file  Tobacco Use   Smoking status: Never   Smokeless tobacco: Never  Vaping Use   Vaping Use: Never used  Substance and Sexual Activity   Alcohol use: No   Drug use: No   Sexual activity: Never    Partners: Male  Other Topics Concern   Not on file  Social History Narrative   Right handed    Caffiene soda small can, sweet tea in evening.  During day drinking water.   Education: college dr   Child care.    Social Determinants of Health   Financial Resource Strain: Not on file  Food Insecurity: Not on file  Transportation Needs: Not on file  Physical Activity: Not on file  Stress: Not on file  Social Connections: Not on file     Family History: The patient's family history includes Breast cancer in her cousin and paternal grandmother; Cancer in her paternal aunt and paternal grandmother; Cardiomyopathy in her  father, maternal grandfather, maternal grandmother, paternal aunt, and paternal uncle; Diabetes in her maternal grandmother and mother; Heart disease in her father, maternal grandfather, maternal grandmother, paternal aunt, and paternal uncle; Hypertension in her father, maternal grandmother, mother, and paternal grandfather; Hyperthyroidism in her mother; Hypothyroidism in her father; Other in her cousin, maternal aunt, maternal grandfather, and maternal grandmother; Other (age of onset: 53) in her father; Sleep apnea in her mother and sister; Stroke in her paternal grandfather.  ROS:   Please see the history of present illness.     All other systems reviewed and are negative.  EKGs/Labs/Other Studies Reviewed:    The following studies were reviewed  today:  EKG:  EKG is  ordered today.  The ekg ordered today demonstrates  08/25/22: SR rate 73  Recent Labs: 02/23/2022: TSH 0.582 08/11/2022: ALT 18; BUN 20; Creatinine, Ser 0.71; Hemoglobin 14.2; Platelets 225; Potassium 4.1; Sodium 140  Recent Lipid Panel No results found for: "CHOL", "TRIG", "HDL", "CHOLHDL", "VLDL", "LDLCALC", "LDLDIRECT"      Physical Exam:    VS:  BP 122/70   Pulse 90   Ht '5\' 8"'$  (1.727 m)   Wt 155 lb 12.8 oz (70.7 kg)   SpO2 97%   BMI 23.69 kg/m     Wt Readings from Last 3 Encounters:  08/25/22 155 lb 12.8 oz (70.7 kg)  04/01/22 158 lb 12.8 oz (72 kg)  02/23/22 159 lb 8 oz (72.3 kg)    GEN:  Well nourished, well developed in no acute distress HEENT: Normal NECK: No JVD CARDIAC: RRR, no rubs, gallops, holosystolic murmur RESPIRATORY:  Clear to auscultation without rales, wheezing or rhonchi  ABDOMEN: Soft, non-tender, non-distended MUSCULOSKELETAL:  No edema; No deformity  SKIN: Warm and dry NEUROLOGIC:  Alert and oriented x 3 PSYCHIATRIC:  Normal affect   ASSESSMENT:    1. Precordial chest pain   2. SOB (shortness of breath)   3. Pulmonary sarcoidosis ([place])   4. Mixed hyperlipidemia    PLAN:     Chest pain Fhx of early CAD HLD - LDL 163, TGS 300, Total cholesterol 265; not amenable to medication at this time - will get stress test  DOE Cardiac Sarcoidosis Eval - With extracardiac sarcoidosis - with biopsy - with/without  complete left or right bundle branch block; presence of unexplained pathologic Q waves in two or more leads; sustained first-, second-, or third-degree AV block; or sustained or nonsustained VT - will get echo; low threshold to get heart monitor - will defer PET Sarcoid protocol or CMR at this time  Summer- Fall with me or APP      Medication Adjustments/Labs and Tests Ordered: Current medicines are reviewed at length with the patient today.  Concerns regarding medicines are outlined above.  Orders Placed This Encounter  Procedures   Cardiac Stress Test: Informed Consent Details: Physician/Practitioner Attestation; Transcribe to consent form and obtain patient signature   MYOCARDIAL PERFUSION IMAGING   ECHOCARDIOGRAM COMPLETE   No orders of the defined types were placed in this encounter.   Patient Instructions  Medication Instructions:  Your physician recommends that you continue on your current medications as directed. Please refer to the Current Medication list given to you today.  *If you need a refill on your cardiac medications before your next appointment, please call your pharmacy*  Lab Work: None  Testing/Procedures: Your physician has requested that you have an echocardiogram. Echocardiography is a painless test that uses sound waves to create images of your heart. It provides your doctor with information about the size and shape of your heart and how well your heart's chambers and valves are working. This procedure takes approximately one hour. There are no restrictions for this procedure. Please do NOT wear cologne, perfume, aftershave, or lotions (deodorant is allowed). Please arrive 15 minutes prior to your appointment time.  Your  physician has requested that you have en exercise stress myoview. For further information please visit [email or web address]. Please follow instruction sheet, as given.   Follow-Up: At [facility], you and your health needs are our priority.  As part of our continuing mission to provide you with exceptional heart care, we have created  designated Provider Care Teams.  These Care Teams include your primary Cardiologist (physician) and Advanced Practice Providers (APPs -  Physician Assistants and Nurse Practitioners) who all work together to provide you with the care you need, when you need it.  Your next appointment:   5-6 month(s)  Provider:   Werner Lean, MD  or Nicholes Rough, PA-C, Melina Copa, PA-C, Ambrose Pancoast, NP, Christen Bame, NP, or Richardson Dopp, PA-C    Other Instructions  Instructions for Exercise Nuclear Stress Test  Please arrive 15 minutes prior to your appointment time for registration and insurance purposes.  The test will take approximately 3 to 4 hours to complete; you may bring reading material.  If someone comes with you to your appointment, they will need to remain in the main lobby due to limited space in the testing area. **If you are pregnant or breastfeeding, please notify the nuclear lab prior to your appointment**  How to prepare for your Myocardial Perfusion Test: Do not eat or drink 3 hours prior to your test, except you may have water. Do not consume products containing caffeine (regular or decaffeinated) 12 hours prior to your test. (ex: coffee, chocolate, sodas, tea). Do bring a list of your current medications with you.  If not listed below, you may take your medications as normal. Do wear comfortable clothes (no dresses or overalls) and walking shoes, tennis shoes preferred (No heels or open toe shoes are allowed). Do NOT wear cologne, perfume, aftershave, or lotions (deodorant is allowed). If these instructions are not followed, your test  will have to be rescheduled.  Please report to [address], [address] for your test.  If you have questions or concerns about your appointment, you can call the Nuclear Lab at [phone].  If you cannot keep your appointment, please provide 24 hours notification to the Nuclear Lab, to avoid a possible $50 charge to your account.   Signed, Werner Lean, MD  08/25/2022 3:16 PM    [place]

## 2022-08-25 NOTE — Patient Instructions (Signed)
Medication Instructions:  Your physician recommends that you continue on your current medications as directed. Please refer to the Current Medication list given to you today.  *If you need a refill on your cardiac medications before your next appointment, please call your pharmacy*  Lab Work: None  Testing/Procedures: Your physician has requested that you have an echocardiogram. Echocardiography is a painless test that uses sound waves to create images of your heart. It provides your doctor with information about the size and shape of your heart and how well your heart's chambers and valves are working. This procedure takes approximately one hour. There are no restrictions for this procedure. Please do NOT wear cologne, perfume, aftershave, or lotions (deodorant is allowed). Please arrive 15 minutes prior to your appointment time.  Your physician has requested that you have en exercise stress myoview. For further information please visit [email or web address]. Please follow instruction sheet, as given.   Follow-Up: At [facility], you and your health needs are our priority.  As part of our continuing mission to provide you with exceptional heart care, we have created designated Provider Care Teams.  These Care Teams include your primary Cardiologist (physician) and Advanced Practice Providers (APPs -  Physician Assistants and Nurse Practitioners) who all work together to provide you with the care you need, when you need it.  Your next appointment:   5-6 month(s)  Provider:   Werner Lean, MD  or Nicholes Rough, PA-C, Melina Copa, PA-C, Ambrose Pancoast, NP, Christen Bame, NP, or Richardson Dopp, PA-C    Other Instructions  Instructions for Exercise Nuclear Stress Test  Please arrive 15 minutes prior to your appointment time for registration and insurance purposes.  The test will take approximately 3 to 4 hours to complete; you may bring reading material.  If someone comes with  you to your appointment, they will need to remain in the main lobby due to limited space in the testing area. **If you are pregnant or breastfeeding, please notify the nuclear lab prior to your appointment**  How to prepare for your Myocardial Perfusion Test: Do not eat or drink 3 hours prior to your test, except you may have water. Do not consume products containing caffeine (regular or decaffeinated) 12 hours prior to your test. (ex: coffee, chocolate, sodas, tea). Do bring a list of your current medications with you.  If not listed below, you may take your medications as normal. Do wear comfortable clothes (no dresses or overalls) and walking shoes, tennis shoes preferred (No heels or open toe shoes are allowed). Do NOT wear cologne, perfume, aftershave, or lotions (deodorant is allowed). If these instructions are not followed, your test will have to be rescheduled.  Please report to [address], [address] for your test.  If you have questions or concerns about your appointment, you can call the Nuclear Lab at [phone].  If you cannot keep your appointment, please provide 24 hours notification to the Nuclear Lab, to avoid a possible $50 charge to your account.

## 2022-09-18 ENCOUNTER — Telehealth (HOSPITAL_COMMUNITY): Payer: Self-pay | Admitting: *Deleted

## 2022-09-18 NOTE — Telephone Encounter (Signed)
Was not able to leave a complete detailed message because mailbox was full. Will try to call patient back at a later date about her scheduled STRESS TEST on 09/25/22.

## 2022-09-22 ENCOUNTER — Telehealth: Payer: Self-pay | Admitting: Internal Medicine

## 2022-09-22 DIAGNOSIS — R0602 Shortness of breath: Secondary | ICD-10-CM

## 2022-09-22 DIAGNOSIS — R072 Precordial pain: Secondary | ICD-10-CM

## 2022-09-22 NOTE — Telephone Encounter (Signed)
Patient states she has concerns with having a nuclear stress test because of the dye. She would like to know if a treadmill stress test is an option for her. She is requesting a call back on her work phone at [phone] between 1:30 PM - 5:00 PM.  1:30 - 5:00

## 2022-09-22 NOTE — Telephone Encounter (Signed)
Attempted to call patient a second time regarding her STRESS TEST on 09/25/22. Still unable to reach or to give detailed instructions. Voicemail box was full.

## 2022-09-23 NOTE — Telephone Encounter (Signed)
Spoke with MD advised that pt can have a stress echocardiogram instead of NMST.  Called pt back to review this however unable to reach pt.  Called previous number as well as mobile number with no answer mailbox is full.   Called pt back advised of MD recommendation for stress echocardiogram. Pt is okay with this decision.  Reviewed instructions will mail out instructions to pt.  Advised a scheduler will call to set up appointment.  All questions answered.

## 2022-09-23 NOTE — Telephone Encounter (Signed)
Called pt in regards to NMST.  Pt concerned that testing will expose her to too much dye.  Had a PET Scan in 2018 and multiple CT scans since 2018 latest in Feb 2024.  Advised pt that testing does not use dye but a small amount of radioactive material.  Pt reports does not want to end up with cancer down the road from repeat exposure.  Pt would like to have a POET and an echocardiogram.  Wants to know if this will give MD all the information he is looking for.  Will send to MD to advise.

## 2022-09-23 NOTE — Telephone Encounter (Signed)
Patient states she has concerns with having a nuclear stress test because of the dye. She would like to know if a treadmill stress test is an option for her. Said that she received a call back but that call did not address what she needed. Patient is requesting to speak with Dr. Gasper Sells or nurse.

## 2022-09-23 NOTE — Addendum Note (Signed)
Addended by: Precious Gilding on: 09/23/2022 03:31 PM   Modules accepted: Orders

## 2022-09-25 ENCOUNTER — Ambulatory Visit (HOSPITAL_COMMUNITY): Payer: BC Managed Care – PPO

## 2022-09-29 NOTE — Telephone Encounter (Signed)
Received a fax from [place] stating patient has declined therapy at this time but will see about the machine in the future when time is better.

## 2022-09-29 NOTE — Telephone Encounter (Signed)
Noted, thank you

## 2022-10-11 NOTE — Addendum Note (Signed)
Addended by: Riley Lam A on: 10/11/2022 03:36 PM   Modules accepted: Orders

## 2022-10-22 ENCOUNTER — Ambulatory Visit (HOSPITAL_COMMUNITY): Payer: BC Managed Care – PPO | Attending: Internal Medicine

## 2022-10-22 ENCOUNTER — Ambulatory Visit (HOSPITAL_COMMUNITY): Payer: BC Managed Care – PPO

## 2022-10-22 DIAGNOSIS — R0602 Shortness of breath: Secondary | ICD-10-CM | POA: Diagnosis not present

## 2022-10-22 DIAGNOSIS — R072 Precordial pain: Secondary | ICD-10-CM | POA: Diagnosis not present

## 2022-10-23 LAB — ECHOCARDIOGRAM STRESS TEST
Area-P 1/2: 3.19 cm2
S' Lateral: 2.5 cm

## 2022-10-27 ENCOUNTER — Telehealth: Payer: Self-pay | Admitting: Internal Medicine

## 2022-10-27 NOTE — Telephone Encounter (Signed)
Patient returning call for results. Please advise   If you called back before 12:20 please call cell after 12:20 please call work number

## 2022-10-27 NOTE — Telephone Encounter (Signed)
Patient has been notified of results.  Scheduled her follow up for 9/11 with APP.  Would prefer Dr. Izora Ribas, added to waitlist.

## 2022-11-04 ENCOUNTER — Other Ambulatory Visit: Payer: Self-pay | Admitting: Obstetrics and Gynecology

## 2022-11-04 DIAGNOSIS — Z1231 Encounter for screening mammogram for malignant neoplasm of breast: Secondary | ICD-10-CM

## 2022-12-21 ENCOUNTER — Ambulatory Visit: Payer: BC Managed Care – PPO

## 2022-12-22 ENCOUNTER — Ambulatory Visit
Admission: RE | Admit: 2022-12-22 | Discharge: 2022-12-22 | Disposition: A | Discharge status: Home / Self Care | Payer: BC Managed Care – PPO | Source: Ambulatory Visit | Attending: Obstetrics and Gynecology | Admitting: Obstetrics and Gynecology

## 2022-12-22 DIAGNOSIS — Z1231 Encounter for screening mammogram for malignant neoplasm of breast: Secondary | ICD-10-CM

## 2022-12-25 ENCOUNTER — Encounter (INDEPENDENT_AMBULATORY_CARE_PROVIDER_SITE_OTHER): Payer: BC Managed Care – PPO | Admitting: Ophthalmology

## 2022-12-25 DIAGNOSIS — H33303 Unspecified retinal break, bilateral: Secondary | ICD-10-CM

## 2022-12-25 DIAGNOSIS — I10 Essential (primary) hypertension: Secondary | ICD-10-CM | POA: Diagnosis not present

## 2022-12-25 DIAGNOSIS — H43813 Vitreous degeneration, bilateral: Secondary | ICD-10-CM

## 2022-12-25 DIAGNOSIS — H35033 Hypertensive retinopathy, bilateral: Secondary | ICD-10-CM | POA: Diagnosis not present

## 2023-03-10 ENCOUNTER — Ambulatory Visit: Payer: BC Managed Care – PPO | Admitting: Physician Assistant

## 2023-03-10 ENCOUNTER — Ambulatory Visit: Payer: BC Managed Care – PPO | Admitting: Nurse Practitioner

## 2023-03-24 ENCOUNTER — Emergency Department (HOSPITAL_BASED_OUTPATIENT_CLINIC_OR_DEPARTMENT_OTHER)
Admission: EM | Admit: 2023-03-24 | Discharge: 2023-03-24 | Disposition: A | Discharge status: Home / Self Care | Payer: BC Managed Care – PPO | Attending: Emergency Medicine | Admitting: Emergency Medicine

## 2023-03-24 ENCOUNTER — Encounter (HOSPITAL_BASED_OUTPATIENT_CLINIC_OR_DEPARTMENT_OTHER): Payer: Self-pay | Admitting: Emergency Medicine

## 2023-03-24 ENCOUNTER — Other Ambulatory Visit: Payer: Self-pay

## 2023-03-24 ENCOUNTER — Other Ambulatory Visit (HOSPITAL_BASED_OUTPATIENT_CLINIC_OR_DEPARTMENT_OTHER): Payer: Self-pay

## 2023-03-24 DIAGNOSIS — R5383 Other fatigue: Secondary | ICD-10-CM | POA: Diagnosis present

## 2023-03-24 DIAGNOSIS — U071 COVID-19: Secondary | ICD-10-CM | POA: Insufficient documentation

## 2023-03-24 DIAGNOSIS — R030 Elevated blood-pressure reading, without diagnosis of hypertension: Secondary | ICD-10-CM | POA: Diagnosis not present

## 2023-03-24 NOTE — ED Triage Notes (Addendum)
Pt c/o weakness, endorses recent positive covid test x 6 days pta. Also reports HTN since yesterday. AOx4.

## 2023-03-24 NOTE — ED Provider Notes (Signed)
[place] EMERGENCY DEPARTMENT AT [facility] Provider Note   CSN: [id number] Arrival date & time: 03/24/23  3329     History  Chief Complaint  Patient presents with   Hypertension    Kristin [name] is a 57 y.o. female.  57 year old female presents today for concern of fatigue, and elevated blood pressure.  She does have history of high blood pressure and takes her antihypertensive as prescribed.  She denies any balance issues, vision change, chest pain, or shortness of breath.  She states she was prescribed Paxlovid by her PCP at the time of diagnosis which was 6 days ago but she has not taken this medication.  She has previously had the COVID vaccinations in 2021 but has not had any since then.  She states her symptoms are overall improving.  Does report lingering fatigue.  She states she drinks about 60 fluid ounces of liquids but this includes teas and ginger ale.  She has also had frequent loose stools but does not describe this as diarrhea.  She has also had some headache.  This is mild.  She does not describe this as a severe or sudden onset headache.  Describes this as a pressure.  She does also have sinus congestion.  This has been improving as well.  The history is provided by the patient. No language interpreter was used.       Home Medications Prior to Admission medications   Medication Sig Start Date End Date Taking? Authorizing Provider  NOREL AD 4-10-325 MG TABS Take 1 tablet by mouth every 6 (six) hours as needed. 03/18/23  Yes [provider]  acetaminophen (TYLENOL) 500 MG tablet Take 500-1,000 mg every 8 (eight) hours as needed by mouth for mild pain (depends on pain if takes 12 tablets).     [provider]  albuterol (PROAIR HFA) 108 (90 Base) MCG/ACT inhaler Inhale 2 puffs into the lungs every 4 (four) hours as needed for wheezing or shortness of breath. 06/02/19 08/25/22  Lorin Glass, MD  aluminum-magnesium hydroxide-simethicone  (MAALOX) 200-200-20 MG/5ML SUSP Take 30 mLs by mouth 2 (two) times daily as needed.    [provider]  Artificial Tear Solution (SOOTHE XP OP) Apply 2 drops at bedtime to eye.    [provider]  Ascorbic Acid (VITAMIN C) 1000 MG tablet every other day.    [provider]  Cholecalciferol (VITAMIN D3) 5000 units CAPS Take 5,000 Units daily by mouth.     [provider]  doxepin (SINEQUAN) 10 MG capsule Take 40-60 mg at bedtime by mouth. Depends on IBS symptoms and migraines if takes 4-6    [provider]  Ergocalciferol (VITAMIN D2) 10 MCG (400 UNIT) TABS Take 2,000 mg by mouth daily at 6 (six) AM. 10/20/16   [provider]  frovatriptan (FROVA) 2.5 MG tablet Take 2.5 mg as needed by mouth for migraine (take 1 tablet at onset of migraine and repeat up to twice if needed). If recurs, may repeat after 2 hours. Max of 3 tabs in 24 hours.     [provider]  loperamide (IMODIUM) 2 MG capsule Take 2-4 mg as needed by mouth for diarrhea or loose stools.    [provider]  loratadine (CLARITIN) 10 MG tablet Take 10 mg by mouth as needed for allergies.    [provider]  losartan (COZAAR) 25 MG tablet Take 25 mg by mouth 2 (two) times daily.     [provider]  Multiple Vitamin (MULTIVITAMIN) tablet Take 1 tablet by mouth daily.    [provider]  Omega-3 Fatty Acids (FISH OIL) 1000 MG CAPS 2 capsules daily at 6 (six) AM.    [provider]  RABEprazole (ACIPHEX) 20 MG tablet daily. 08/18/22 08/13/23  [provider]  sodium chloride (OCEAN) 0.65 % SOLN nasal spray Place 1 spray as needed into both nostrils for congestion.    [provider]  tretinoin (RETIN-A) 0.05 % cream daily at 6 (six) AM. 04/03/22   [provider]      Allergies    Amoxicillin, Dicyclomine, Doxycycline, Indocin [indomethacin], Levaquin [levofloxacin], Probiotic [acidophilus], Bacillus  coagulans-inulin, Bentyl [dicyclomine hcl], Erythromycin, Keflex [cephalexin], Lansoprazole, Nexium [esomeprazole], Sudafed [pseudoephedrine hcl], Sulfamethoxazole, and Tetanus toxoids    Review of Systems   Review of Systems  Constitutional:  Negative for chills and fever.  Eyes:  Negative for visual disturbance.  Respiratory:  Negative for shortness of breath.   Cardiovascular:  Negative for chest pain.  Neurological:  Negative for light-headedness and headaches.  All other systems reviewed and are negative.   Physical Exam Updated Vital Signs BP (!) 159/101 (BP Location: Right Arm)   Pulse 97   Temp 98.8 F (37.1 C) (Oral)   Resp 16   Ht 5\' 9"  (1.753 m)   Wt 68 kg   SpO2 97%   BMI 22.15 kg/m  Physical Exam Vitals and nursing note reviewed.  Constitutional:      General: She is not in acute distress.    Appearance: Normal appearance. She is not ill-appearing.  HENT:     Head: Normocephalic and atraumatic.     Nose: Nose normal.  Eyes:     Conjunctiva/sclera: Conjunctivae normal.  Cardiovascular:     Rate and Rhythm: Normal rate and regular rhythm.     Heart sounds: Normal heart sounds.  Pulmonary:     Effort: Pulmonary effort is normal. No respiratory distress.  Musculoskeletal:        General: No deformity. Normal range of motion.     Cervical back: Normal range of motion.  Skin:    Findings: No rash.  Neurological:     General: No focal deficit present.     Mental Status: She is alert and oriented to person, place, and time. Mental status is at baseline.     Cranial Nerves: No cranial nerve deficit.     Motor: No weakness.     ED Results / Procedures / Treatments   Labs (all labs ordered are listed, but only abnormal results are displayed) Labs Reviewed - No data to display  EKG EKG Interpretation Date/Time:  Wednesday March 24 2023 09:22:28 EDT Ventricular Rate:  93 PR Interval:  129 QRS Duration:  86 QT Interval:  357 QTC Calculation: 444 R  Axis:   76  Text Interpretation: Sinus rhythm No significant change since last tracing Confirmed by Zadie Rhine ([id number]) on 03/24/2023 9:26:24 AM  Radiology No results found.  Procedures Procedures    Medications Ordered in ED Medications - No data to display  ED Course/ Medical Decision Making/ A&P                                 Medical Decision Making  57 year old female presents today for concern of generalized fatigue.  She was recently diagnosed with COVID 6 days ago.  She has not taken Paxlovid that she was prescribed.  She states overall her symptoms are improving however she just tested ongoing generalized fatigue, mild headache.  Initially had fevers but these have since resolved.  She is overall well-appearing.  She is also concerned that her blood pressure is running somewhat higher than normal.  She is compliant with her medication.  She is not without any signs or symptoms of hypertensive emergency.  Discussed monitoring BP at home and following up with PCP.  Return precautions given if she develops chest pain, shortness of breath, vision change, or balance issues.  Discussed increasing hydration.  She feels she can increase hydration at home with p.o. fluids.  Discussed primarily drinking water and Gatorade zero.  She is stable for discharge.  Final Clinical Impression(s) / ED Diagnoses Final diagnoses:  COVID-19  Fatigue, unspecified type  Elevated blood pressure reading    Rx / DC Orders ED Discharge Orders     None         Marita Kansas, PA-C 03/24/23 1014    Zadie Rhine, MD 03/24/23 1439

## 2023-03-24 NOTE — Discharge Instructions (Signed)
Your exam and workup today was reassuring.  Increase your fluid intake with mostly water and Gatorade zero.  If you have any concerning symptoms return to the emergency room.  Continue to monitor your blood pressure.  If you are able to check it once a day around the same time to follow-up with your primary care provider.  If this continues to run high they may need to adjust your medicine.  If you develop chest pain, shortness of breath, vision change, or balance issues return to the emergency room.

## 2023-03-26 ENCOUNTER — Encounter (HOSPITAL_BASED_OUTPATIENT_CLINIC_OR_DEPARTMENT_OTHER): Payer: Self-pay

## 2023-03-26 ENCOUNTER — Emergency Department (HOSPITAL_BASED_OUTPATIENT_CLINIC_OR_DEPARTMENT_OTHER)
Admission: EM | Admit: 2023-03-26 | Discharge: 2023-03-26 | Disposition: A | Discharge status: Home / Self Care | Payer: BC Managed Care – PPO | Attending: Emergency Medicine | Admitting: Emergency Medicine

## 2023-03-26 ENCOUNTER — Other Ambulatory Visit: Payer: Self-pay

## 2023-03-26 ENCOUNTER — Other Ambulatory Visit (HOSPITAL_BASED_OUTPATIENT_CLINIC_OR_DEPARTMENT_OTHER): Payer: Self-pay

## 2023-03-26 ENCOUNTER — Emergency Department (HOSPITAL_BASED_OUTPATIENT_CLINIC_OR_DEPARTMENT_OTHER): Payer: BC Managed Care – PPO | Admitting: Radiology

## 2023-03-26 DIAGNOSIS — Z79899 Other long term (current) drug therapy: Secondary | ICD-10-CM | POA: Insufficient documentation

## 2023-03-26 DIAGNOSIS — U071 COVID-19: Secondary | ICD-10-CM | POA: Insufficient documentation

## 2023-03-26 DIAGNOSIS — I10 Essential (primary) hypertension: Secondary | ICD-10-CM | POA: Insufficient documentation

## 2023-03-26 DIAGNOSIS — R051 Acute cough: Secondary | ICD-10-CM

## 2023-03-26 DIAGNOSIS — R5383 Other fatigue: Secondary | ICD-10-CM

## 2023-03-26 DIAGNOSIS — R059 Cough, unspecified: Secondary | ICD-10-CM | POA: Diagnosis present

## 2023-03-26 DIAGNOSIS — R0789 Other chest pain: Secondary | ICD-10-CM

## 2023-03-26 LAB — MAGNESIUM: Magnesium: 2.2 mg/dL (ref 1.7–2.4)

## 2023-03-26 LAB — COMPREHENSIVE METABOLIC PANEL
ALT: 13 U/L (ref 0–44)
AST: 15 U/L (ref 15–41)
Albumin: 4.4 g/dL (ref 3.5–5.0)
Alkaline Phosphatase: 75 U/L (ref 38–126)
Anion gap: 10 (ref 5–15)
BUN: 13 mg/dL (ref 6–20)
CO2: 29 mmol/L (ref 22–32)
Calcium: 9.5 mg/dL (ref 8.9–10.3)
Chloride: 101 mmol/L (ref 98–111)
Creatinine, Ser: 0.67 mg/dL (ref 0.44–1.00)
GFR, Estimated: >60 mL/min (ref >60)
Glucose, Bld: 107 mg/dL — ABNORMAL HIGH (ref 70–99)
Potassium: 3.6 mmol/L (ref 3.5–5.1)
Sodium: 140 mmol/L (ref 135–145)
Total Bilirubin: 0.6 mg/dL (ref 0.3–1.2)
Total Protein: 7.5 g/dL (ref 6.5–8.1)

## 2023-03-26 LAB — CBC WITH DIFFERENTIAL/PLATELET
Abs Immature Granulocytes: 0.02 10*3/uL (ref 0.00–0.07)
Basophils Absolute: 0 10*3/uL (ref 0.0–0.1)
Basophils Relative: 0 %
Eosinophils Absolute: 0 10*3/uL (ref 0.0–0.5)
Eosinophils Relative: 0 %
HCT: 44 % (ref 36.0–46.0)
Hemoglobin: 14.9 g/dL (ref 12.0–15.0)
Immature Granulocytes: 0 %
Lymphocytes Relative: 20 %
Lymphs Abs: 1.6 10*3/uL (ref 0.7–4.0)
MCH: 28.2 pg (ref 26.0–34.0)
MCHC: 33.9 g/dL (ref 30.0–36.0)
MCV: 83.2 fL (ref 80.0–100.0)
Monocytes Absolute: 0.5 10*3/uL (ref 0.1–1.0)
Monocytes Relative: 6 %
Neutro Abs: 6 10*3/uL (ref 1.7–7.7)
Neutrophils Relative %: 74 %
Platelets: 233 10*3/uL (ref 150–400)
RBC: 5.29 MIL/uL — ABNORMAL HIGH (ref 3.87–5.11)
RDW: 12.4 % (ref 11.5–15.5)
WBC: 8.1 10*3/uL (ref 4.0–10.5)
nRBC: 0 % (ref 0.0–0.2)

## 2023-03-26 LAB — TROPONIN I (HIGH SENSITIVITY): Troponin I (High Sensitivity): <2 ng/L (ref <18)

## 2023-03-26 MED ORDER — LACTATED RINGERS IV BOLUS
1000.0000 mL | Freq: Once | INTRAVENOUS | Status: AC
  Administered 2023-03-26: 1000 mL via INTRAVENOUS

## 2023-03-26 MED ORDER — ONDANSETRON HCL 4 MG/2ML IJ SOLN
4.0000 mg | Freq: Once | INTRAMUSCULAR | Status: DC
  Filled 2023-03-26: qty 2

## 2023-03-26 MED ORDER — AZITHROMYCIN 250 MG PO TABS
250.0000 mg | ORAL_TABLET | Freq: Every day | ORAL | 0 refills | Status: DC
  Filled 2023-03-26: qty 6, 6d supply, fill #0

## 2023-03-26 MED ORDER — AZITHROMYCIN 250 MG PO TABS: 250.0000 mg | ORAL_TABLET | Freq: Every day | ORAL | 0 refills | Status: DC

## 2023-03-26 NOTE — ED Notes (Addendum)
Discharge paperwork given and verbally understood.... Discharged by different RN.Marland KitchenMarland Kitchen

## 2023-03-26 NOTE — ED Provider Notes (Signed)
[place] EMERGENCY DEPARTMENT AT [facility] Provider Note   CSN: [id number] Arrival date & time: 03/26/23  1428     History {Add pertinent medical, surgical, social history, OB history to HPI:1} No chief complaint on file.   Kristin [name] is a 57 y.o. female.  HPI      57 year old female with history of hypertension, diagnosed with COVID 9 days ago, presents with concern for continued COVID symptoms  Still feel very fatigued and weak, not eating a lot. Made self eat but only a little at a time. Since Sunday throat feels not sore but different, yesterday AM coughing up stuff think coming from throat not chest, yellowish-green and as day goes on is dark yellow light yellow clear Came in today because of the cough, not sure if new pharyngitis Sometimes feeling tightness in chest, not sure if anxiety, has been breathing ok not short of breath but was concerned about the tightness.  Wednesday had leg twitching and came in, was drinking gatorade, ater, juice got better then during the night.  Feels like muscles twitching on both side thighs. Bp was high also on Wednesday Diarrhea, loose stool, still having it now took 2 imodium before coming Wednesday and didn't have to go until now, right before coming got watery.   Took more.   No fevers any more 99.4  Past Medical History:  Diagnosis Date   Anxiety    Dizziness    Dysrhythmia    palpitations   Fibromyalgia 04/13/2017   GERD (gastroesophageal reflux disease)    Headache    History of migraine headaches    Hypertension    hx arrhythmia   IBS (irritable bowel syndrome)    Lymphadenopathy    Seasonal allergies    Thyroid nodule    Vertigo    Vitamin D deficiency      Home Medications Prior to Admission medications   Medication Sig Start Date End Date Taking? Authorizing Provider  acetaminophen (TYLENOL) 500 MG tablet Take 500-1,000 mg every 8 (eight) hours as needed by mouth for mild pain (depends on pain if  takes 12 tablets).     [provider]  albuterol (PROAIR HFA) 108 (90 Base) MCG/ACT inhaler Inhale 2 puffs into the lungs every 4 (four) hours as needed for wheezing or shortness of breath. 06/02/19 08/25/22  Lorin Glass, MD  aluminum-magnesium hydroxide-simethicone (MAALOX) 200-200-20 MG/5ML SUSP Take 30 mLs by mouth 2 (two) times daily as needed.    [provider]  Artificial Tear Solution (SOOTHE XP OP) Apply 2 drops at bedtime to eye.    [provider]  Ascorbic Acid (VITAMIN C) 1000 MG tablet every other day.    [provider]  Cholecalciferol (VITAMIN D3) 5000 units CAPS Take 5,000 Units daily by mouth.     [provider]  doxepin (SINEQUAN) 10 MG capsule Take 40-60 mg at bedtime by mouth. Depends on IBS symptoms and migraines if takes 4-6    [provider]  Ergocalciferol (VITAMIN D2) 10 MCG (400 UNIT) TABS Take 2,000 mg by mouth daily at 6 (six) AM. 10/20/16   [provider]  frovatriptan (FROVA) 2.5 MG tablet Take 2.5 mg as needed by mouth for migraine (take 1 tablet at onset of migraine and repeat up to twice if needed). If recurs, may repeat after 2 hours. Max of 3 tabs in 24 hours.     [provider]  loperamide (IMODIUM) 2 MG capsule Take 2-4 mg as  needed by mouth for diarrhea or loose stools.    [provider]  loratadine (CLARITIN) 10 MG tablet Take 10 mg by mouth as needed for allergies.    [provider]  losartan (COZAAR) 25 MG tablet Take 25 mg by mouth 2 (two) times daily.     [provider]  Multiple Vitamin (MULTIVITAMIN) tablet Take 1 tablet by mouth daily.    [provider]  NOREL AD 4-10-325 MG TABS Take 1 tablet by mouth every 6 (six) hours as needed. 03/18/23   [provider]  Omega-3 Fatty Acids (FISH OIL) 1000 MG CAPS 2 capsules daily at 6 (six) AM.    [provider]  RABEprazole (ACIPHEX) 20 MG tablet daily. 08/18/22 08/13/23  [provider]  sodium chloride (OCEAN) 0.65 % SOLN nasal spray Place 1 spray as needed into both nostrils for congestion.    [provider]  tretinoin (RETIN-A) 0.05 % cream daily at 6 (six) AM. 04/03/22   [provider]      Allergies    Amoxicillin, Dicyclomine, Doxycycline, Indocin [indomethacin], Levaquin [levofloxacin], Probiotic [acidophilus], Bacillus coagulans-inulin, Bentyl [dicyclomine hcl], Erythromycin, Keflex [cephalexin], Lansoprazole, Nexium [esomeprazole], Sudafed [pseudoephedrine hcl], Sulfamethoxazole, and Tetanus toxoids    Review of Systems   Review of Systems  Physical Exam Updated Vital Signs BP (!) 150/99 (BP Location: Right Arm)   Pulse 94   Temp 99 F (37.2 C) (Oral)   Resp 20   Ht 5\' 9"  (1.753 m)   Wt 68 kg   SpO2 99%   BMI 22.15 kg/m  Physical Exam  ED Results / Procedures / Treatments   Labs (all labs ordered are listed, but only abnormal results are displayed) Labs Reviewed  CBC WITH DIFFERENTIAL/PLATELET  COMPREHENSIVE METABOLIC PANEL  MAGNESIUM    EKG None  Radiology No results found.  Procedures Procedures  {Document cardiac monitor, telemetry assessment procedure when appropriate:1}  Medications Ordered in ED Medications - No data to display  ED Course/ Medical Decision Making/ A&P   {   Click here for ABCD2, HEART and other calculatorsREFRESH Note before signing :1}                              Medical Decision Making Amount and/or Complexity of Data Reviewed Labs: ordered. Radiology: ordered.   ***  {Document critical care time when appropriate:1} {Document review of labs and clinical decision tools ie heart score, Chads2Vasc2 etc:1}  {Document your independent review of radiology images, and any outside records:1} {Document your discussion with family members, caretakers, and with consultants:1} {Document social determinants of health affecting pt's care:1} {Document your decision making why or  why not admission, treatments were needed:1} Final Clinical Impression(s) / ED Diagnoses Final diagnoses:  None    Rx / DC Orders ED Discharge Orders     None

## 2023-03-26 NOTE — ED Triage Notes (Signed)
Pt presents with ongoing COVID symptoms and is on day 9. Pt reports on going "leg twitching," cough has turned productive. Denies ShOB.

## 2023-05-25 ENCOUNTER — Ambulatory Visit: Payer: BC Managed Care – PPO | Admitting: Physician Assistant

## 2023-06-03 ENCOUNTER — Telehealth: Payer: Self-pay | Admitting: Diagnostic Neuroimaging

## 2023-06-03 NOTE — Telephone Encounter (Signed)
I called patient at the work number provided but the person who answered said that the patient was unavailable.   I called patient's cell phone number and left a message asking her to call us back.  Patient will need a referral for a new symptom of tremors from PCP. For acute issues, she will need to go to urgent care or ER. Dr. Marjory Lies last saw patient in 2023 for paraesthesia and recommended a PRN follow up.  If patient returns call, please reiterate this with her.

## 2023-06-03 NOTE — Telephone Encounter (Signed)
Pt said having a new symptoms of tremors in legs since Monday night and from time to time will have tremors hands. Still having numbness, tingling in arms,  legs and face. Informed pt would need a referral for the tremors. Can call me at this work number [phone]

## 2023-06-04 NOTE — Telephone Encounter (Signed)
Pt returned phone called, relayed message, patient verbalized under stand.

## 2023-08-03 ENCOUNTER — Encounter: Payer: Self-pay | Admitting: Diagnostic Neuroimaging

## 2023-08-03 ENCOUNTER — Encounter: Payer: Self-pay | Admitting: *Deleted

## 2023-08-03 ENCOUNTER — Ambulatory Visit: Payer: BC Managed Care – PPO | Admitting: Diagnostic Neuroimaging

## 2023-08-03 VITALS — BP 134/78 | HR 60 | Ht 68.0 in | Wt 145.2 lb

## 2023-08-03 DIAGNOSIS — R251 Tremor, unspecified: Secondary | ICD-10-CM

## 2023-08-03 DIAGNOSIS — R200 Anesthesia of skin: Secondary | ICD-10-CM

## 2023-08-03 DIAGNOSIS — R253 Fasciculation: Secondary | ICD-10-CM

## 2023-08-03 NOTE — Progress Notes (Signed)
 [facility]  PATIENT: Kristin [name] DOB: 1965-10-25  REFERRING CLINICIAN: Clide Coy, MD HISTORY FROM: patient REASON FOR VISIT: new consult   HISTORICAL  CHIEF COMPLAINT:  Chief Complaint  Patient presents with   Consult    Pt in room 6 alone. Here for tremor consult. Pt having hand tremors for a while, pt said about 9 weeks ago she noticed internal tremors and twitching. Pt said the tremors vary on days, some days mild, some days are intense.     HISTORY OF PRESENT ILLNESS:  UPDATE (08/03/23, VRP): Since last visit, doing well until Sept 2024. Had covid, then had more lip swelling in Oct 2024, then more swelling in face and eyes in Nov 2024. Then had more tremors and twitching in [id number] (in setting of prednisone at end of Nov). Now slightly improved.   UPDATE (02/23/22, VRP): 58 year old female with numbness.   January 05, 2022 --> numbness at work (calves, arms, face; over the course of a day; similar to prior events). Also malaise x 1 week prior. Muscle fatigue sensation. Since then has improved. Some excessive daytime sleepiness. Some fibromyalgia type symptoms.  PRIOR HPI (04/11/20, Dr. Jenel):  Ms. Lupinacci is a 58 year old right-handed white female with a history of migraine headaches.  The patient has irritable bowel syndrome and fibromyalgia type symptoms.  There is some question whether she has sarcoidosis, but it appears that her pulmonologist now questions this diagnosis.  The patient has had a history of intermittent migratory paresthesias that have been present for greater than 10 years, she had MRI of the brain and cervical spine on her last visit over a year ago, the studies were ordered with and without contrast due to the history of sarcoidosis, but the patient refused the contrast agent.  The studies were normal.  The patient returns today with a 1 month history of a heavy feeling in the head on the right side, sometimes associated with lightheaded  sensations.  The patient does have a prior history of vertigo off and on.  She claims that her migraine headaches usually occur on the left side of the head and spread to the frontal areas, may be severe once or twice a month where she has to take Frova which is helpful.  The patient has some form of headache almost every day however.  The patient denies any recent medication changes.  The patient does report some photophobia and phonophobia with the headache, she denies any nausea or vomiting.  She returns to the office today for an evaluation.   REVIEW OF SYSTEMS: Full 14 system review of systems performed and negative with exception of: as per HHPI.  ALLERGIES: Allergies  Allergen Reactions   Amoxicillin Hives    Has patient had a PCN reaction causing immediate rash, facial/tongue/throat swelling, SOB or lightheadedness with hypotension: UNKNOWN  Has patient had a PCN reaction causing severe rash involving mucus membranes or skin necrosis:  #  #  #  NO  #  #  #  Has patient had a PCN reaction that required hospitalization  #  #  #  NO  #  #  #  Has patient had a PCN reaction occurring within the last 10 years: #  #  #  YES  #  #  #  If all NO's, may proceed with Cephalosporin use.   Dicyclomine     Other Reaction(s): diarrhea, Other   Doxycycline Hives   Indocin [Indomethacin] Hives  Levaquin [Levofloxacin] Hives   Probiotic [Bacid] Hives   Bacillus Coagulans-Inulin     Other Reaction(s): hives   Bentyl [Dicyclomine Hcl] Diarrhea   Erythromycin Nausea Only    STOMACH CRAMPS   Keflex [Cephalexin] Rash   Lansoprazole Other (See Comments)    HEADACHE   Nexium [Esomeprazole] Hives and Other (See Comments)    INTOLERANCE >  HEADACHE    Sudafed [Pseudoephedrine Hcl] Other (See Comments)    TACHYCARDIA   Sulfamethoxazole Rash   Tetanus Toxoids Other (See Comments)    LOCAL REACTION...REDNESS/KNOT    HOME MEDICATIONS: Outpatient Medications Prior to Visit  Medication Sig  Dispense Refill   acetaminophen  (TYLENOL ) 500 MG tablet Take 500-1,000 mg every 8 (eight) hours as needed by mouth for mild pain (depends on pain if takes 12 tablets).      aluminum-magnesium hydroxide-simethicone (MAALOX) 200-200-20 MG/5ML SUSP Take 30 mLs by mouth 2 (two) times daily as needed.     Artificial Tear Solution (SOOTHE XP OP) Apply 2 drops at bedtime to eye.     Ascorbic Acid (VITAMIN C) 1000 MG tablet every other day.     atenolol (TENORMIN) 25 MG tablet Take by mouth daily.     Cholecalciferol (VITAMIN D3) 5000 units CAPS Take 1,000 Units by mouth daily.     doxepin  (SINEQUAN ) 10 MG capsule Take 40-60 mg at bedtime by mouth. Depends on IBS symptoms and migraines if takes 4-6     frovatriptan (FROVA) 2.5 MG tablet Take 2.5 mg as needed by mouth for migraine (take 1 tablet at onset of migraine and repeat up to twice if needed). If recurs, may repeat after 2 hours. Max of 3 tabs in 24 hours.      imiquimod (ALDARA) 5 % cream Apply topically at bedtime.     loperamide (IMODIUM) 2 MG capsule Take 2-4 mg as needed by mouth for diarrhea or loose stools.     loratadine (CLARITIN) 10 MG tablet Take 10 mg by mouth as needed for allergies.     Multiple Vitamin (MULTIVITAMIN) tablet Take 1 tablet by mouth daily.     NOREL AD 4-10-325 MG TABS Take 1 tablet by mouth every 6 (six) hours as needed.     Omega-3 Fatty Acids (FISH OIL) 1000 MG CAPS 2 capsules daily at 6 (six) AM.     sodium chloride  (OCEAN) 0.65 % SOLN nasal spray Place 1 spray as needed into both nostrils for congestion.     tretinoin (RETIN-A) 0.05 % cream daily at 6 (six) AM.     albuterol  (PROAIR  HFA) 108 (90 Base) MCG/ACT inhaler Inhale 2 puffs into the lungs every 4 (four) hours as needed for wheezing or shortness of breath. 8 g 5   azithromycin  (ZITHROMAX ) 250 MG tablet Take 1 tablet (250 mg total) by mouth daily. Take first 2 tablets together, then 1 every day until finished. (Patient not taking: Reported on 08/03/2023) 6 tablet  0   Ergocalciferol (VITAMIN D2) 10 MCG (400 UNIT) TABS Take 2,000 mg by mouth daily at 6 (six) AM. (Patient not taking: Reported on 08/03/2023)     losartan  (COZAAR ) 25 MG tablet Take 25 mg by mouth 2 (two) times daily.  (Patient not taking: Reported on 08/03/2023)     RABEprazole (ACIPHEX) 20 MG tablet daily. (Patient not taking: Reported on 08/03/2023)     No facility-administered medications prior to visit.    PAST MEDICAL HISTORY: Past Medical History:  Diagnosis Date   Anxiety    Dizziness  Dysrhythmia    palpitations   Fibromyalgia 04/13/2017   GERD (gastroesophageal reflux disease)    Headache    History of migraine headaches    Hypertension    hx arrhythmia   IBS (irritable bowel syndrome)    Lymphadenopathy    Seasonal allergies    Thyroid  nodule    Vertigo    Vitamin D deficiency     PAST SURGICAL HISTORY: Past Surgical History:  Procedure Laterality Date   DILATION AND CURETTAGE OF UTERUS  2006, 2009   EYE SURGERY Bilateral 1997   LASER   THYOID NODULE  05/2009   FNA...DR. LINDELL .SABRASABRASABRANEG   VAGINAL HYSTERECTOMY  12/2017   LAPAROSCOPICALLY ASSISTED/DR. ELIZABETH SKINNER   VIDEO ASSISTED THORACOSCOPY (VATS)/ LYMPH NODE SAMPLING Left 05/28/2017   Procedure: LEFT PERISTERNAL EXPLORATION, LEFT VIDEO ASSISTED THORACOSCOPY (VATS), MEDIASTINAL AND AP WINDOW LYMPH NODE DISSECTION;  Surgeon: Army Dallas NOVAK, MD;  Location: [facility] OR;  Service: Thoracic;  Laterality: Left;   VIDEO BRONCHOSCOPY WITH ENDOBRONCHIAL ULTRASOUND N/A 05/17/2017   Procedure: VIDEO BRONCHOSCOPY WITH ENDOBRONCHIAL ULTRASOUND WITH TRANSBRONCHIAL BX OF LYMPH NODES 7, 10 R AND 10 L.;  Surgeon: Army Dallas NOVAK, MD;  Location: [facility] OR;  Service: Thoracic;  Laterality: N/A;    FAMILY HISTORY: Family History  Problem Relation Age of Onset   Hypertension Mother    Diabetes Mother    Hyperthyroidism Mother    Sleep apnea Mother    Hypertension Father    Heart disease Father    Hypothyroidism  Father    Cardiomyopathy Father    Other Father 17       CAUSE OF DEATH..CEREBRAL HEMORR   Sleep apnea Sister    Diabetes Maternal Grandmother    Hypertension Maternal Grandmother    Heart disease Maternal Grandmother        MI   Other Maternal Grandmother        GRAVES DISEASE   Cardiomyopathy Maternal Grandmother    Other Maternal Grandfather        AAA   Cardiomyopathy Maternal Grandfather    Heart disease Maternal Grandfather        MI   Cancer Paternal Grandmother        BREAST, OTHER UNSPECIFIED SITE   Breast cancer Paternal Grandmother    Hypertension Paternal Grandfather    Stroke Paternal Grandfather        ISCHEMIC STROKE   Other Maternal Aunt        THYROID  DISEASE   Heart disease Paternal Aunt        CABG   Cardiomyopathy Paternal Aunt    Cancer Paternal Aunt        STOMACH   Cardiomyopathy Paternal Uncle    Heart disease Paternal Uncle        CABG   Other Cousin        THYROID     Breast cancer Cousin     SOCIAL HISTORY: Social History   Socioeconomic History   Marital status: Single    Spouse name: Not on file   Number of children: 0   Years of education: Not on file   Highest education level: Not on file  Occupational History   Not on file  Tobacco Use   Smoking status: Never   Smokeless tobacco: Never  Vaping Use   Vaping status: Never Used  Substance and Sexual Activity   Alcohol use: No   Drug use: No   Sexual activity: Never    Partners: Male  Other Topics  Concern   Not on file  Social History Narrative   Right handed    Caffiene soda small can, sweet tea in evening.  During day drinking water.   Education: college dr   Child care.    Social Drivers of Corporate Investment Banker Strain: Not on file  Food Insecurity: No Food Insecurity (06/19/2022)   Received from [facility], [facility]   Hunger Vital Sign    Worried About Running Out of Food in the Last Year: Never true    Ran Out of Food in the Last Year: Never true   Transportation Needs: Not on file  Physical Activity: Not on file  Stress: Not on file  Social Connections: Unknown (11/03/2021)   Received from [facility], [facility]   Social Network    Social Network: Not on file  Intimate Partner Violence: Unknown (10/01/2021)   Received from [facility], [facility]   HITS    Physically Hurt: Not on file    Insult or Talk Down To: Not on file    Threaten Physical Harm: Not on file    Scream or Curse: Not on file     PHYSICAL EXAM  GENERAL EXAM/CONSTITUTIONAL: Vitals:  Vitals:   08/03/23 0904  BP: 134/78  Pulse: 60  Weight: 145 lb 3.2 oz (65.9 kg)  Height: 5' 8 (1.727 m)   Body mass index is 22.08 kg/m. Wt Readings from Last 3 Encounters:  08/03/23 145 lb 3.2 oz (65.9 kg)  03/26/23 150 lb (68 kg)  03/24/23 150 lb (68 kg)   Patient is in no distress; well developed, nourished and groomed; neck is supple  CARDIOVASCULAR: Examination of carotid arteries is normal; no carotid bruits Regular rate and rhythm, no murmurs Examination of peripheral vascular system by observation and palpation is normal  EYES: Ophthalmoscopic exam of optic discs and posterior segments is normal; no papilledema or hemorrhages No results found.  MUSCULOSKELETAL: Gait, strength, tone, movements noted in Neurologic exam below  NEUROLOGIC: MENTAL STATUS:      No data to display         awake, alert, oriented to person, place and time recent and remote memory intact normal attention and concentration language fluent, comprehension intact, naming intact fund of knowledge appropriate  CRANIAL NERVE:  2nd - no papilledema on fundoscopic exam 2nd, 3rd, 4th, 6th - pupils equal and reactive to light, visual fields full to confrontation, extraocular muscles intact, no nystagmus 5th - facial sensation symmetric 7th - facial strength symmetric 8th - hearing intact 9th - palate elevates symmetrically, uvula midline 11th - shoulder shrug  symmetric 12th - tongue protrusion midline  MOTOR:  normal bulk and tone, full strength in the BUE, BLE  SENSORY:  normal and symmetric to light touch, temperature, vibration  COORDINATION:  finger-nose-finger, fine finger movements normal  REFLEXES:  deep tendon reflexes present and symmetric  GAIT/STATION:  narrow based gait     DIAGNOSTIC DATA (LABS, IMAGING, TESTING) - I reviewed patient records, labs, notes, testing and imaging myself where available.  Lab Results  Component Value Date   WBC 8.1 03/26/2023   HGB 14.9 03/26/2023   HCT 44.0 03/26/2023   MCV 83.2 03/26/2023   PLT 233 03/26/2023      Component Value Date/Time   NA 140 03/26/2023 1502   NA 142 01/19/2019 1618   K 3.6 03/26/2023 1502   CL 101 03/26/2023 1502   CO2 29 03/26/2023 1502   GLUCOSE 107 (H) 03/26/2023 1502  BUN 13 03/26/2023 1502   BUN 17 01/19/2019 1618   CREATININE 0.67 03/26/2023 1502   CALCIUM 9.5 03/26/2023 1502   PROT 7.5 03/26/2023 1502   PROT 6.7 02/23/2022 1008   ALBUMIN 4.4 03/26/2023 1502   ALBUMIN 4.8 01/19/2019 1618   AST 15 03/26/2023 1502   ALT 13 03/26/2023 1502   ALKPHOS 75 03/26/2023 1502   BILITOT 0.6 03/26/2023 1502   BILITOT 0.3 01/19/2019 1618   GFRNONAA >60 03/26/2023 1502   GFRAA 95 01/19/2019 1618   No results found for: CHOL, HDL, LDLCALC, LDLDIRECT, TRIG, CHOLHDL Lab Results  Component Value Date   HGBA1C 6.1 (H) 02/23/2022   Lab Results  Component Value Date   VITAMINB12 1,050 02/23/2022   Lab Results  Component Value Date   TSH 0.582 02/23/2022    Component Ref Range & Units 1 mo ago  Free T4 0.82 - 1.77 ng/dL 8.57   Component Ref Range & Units 1 mo ago  TSH 0.450 - 4.50 uIU/mL 0.806     02/08/19  Unremarkable MRI scan of the brain without contrast.   06/23/22  HST --> OSA (obstructive sleep apnea), mild     ASSESSMENT AND PLAN  58 y.o. year old female here with:   Dx:  1. Muscle twitching   2. Tremor    3. 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    EDUARD FABIENE HANLON, MD 08/03/2023, 10:25 AM Certified in Neurology, Neurophysiology and Neuroimaging  [facility] [address], [address] [place], [place] [address] [phone]

## 2023-08-03 NOTE — Patient Instructions (Signed)
  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

## 2023-08-04 ENCOUNTER — Encounter: Payer: Self-pay | Admitting: Physician Assistant

## 2023-08-04 ENCOUNTER — Ambulatory Visit: Payer: BC Managed Care – PPO | Attending: Physician Assistant | Admitting: Physician Assistant

## 2023-08-04 VITALS — BP 112/80 | HR 70 | Resp 16 | Ht 68.0 in | Wt 146.0 lb

## 2023-08-04 DIAGNOSIS — E782 Mixed hyperlipidemia: Secondary | ICD-10-CM | POA: Diagnosis not present

## 2023-08-04 DIAGNOSIS — I10 Essential (primary) hypertension: Secondary | ICD-10-CM

## 2023-08-04 DIAGNOSIS — R072 Precordial pain: Secondary | ICD-10-CM

## 2023-08-04 DIAGNOSIS — D860 Sarcoidosis of lung: Secondary | ICD-10-CM

## 2023-08-04 NOTE — Assessment & Plan Note (Signed)
 Follow up with pulmonary as planned. She had an EKG in 03/2023 that did not show evidence of conduction system disease. She has not had syncope. ETT-Echocardiogram in 09/2022 was low risk. She has not had heart failure symptoms or syncope. She does not need testing for cardiac sarcoid at this time.

## 2023-08-04 NOTE — Progress Notes (Signed)
 Cardiology Office Note:    Date:  08/04/2023  ID:  Kristin [name], DOB 1966-03-07, MRN [id number] PCP: Meredeth Prentice KIDD, MD  [facility] Providers Cardiologist:  Stanly DELENA Leavens, MD       Patient Profile:      Chest pain  ETT-Echo 10/22/22:  no ischemia, low risk Pulmonary Sarcoid (LN Bx 2018) Hypertension  Hyperlipidemia  FHx of CAD       History of Present Illness:   Kristin [name] is a 58 y.o. female who returns for follow up of chest pain. She was evaluated by Dr. Leavens in 07/2022 for chest pain. Echocardiogram and Myoview were ordered. The pt did not want to do a nuclear stress test and she was changed to a stress echocardiogram. This was low risk. She is here alone. She has not had chest pain, shortness of breath, syncope, near syncope. Her PCP changed her Losartan  to Atenolol due to concerns she was having an allergic reaction. The swelling in her face did not change. She is seeing an allergist soon. Her BP has been optimal. She has not had any side effects.   ROS-See HPI     Studies Reviewed:       Labs reviewed thru [facility] 04/06/23: TC 229, Tg 432, HDL 42, LDL 112, Hgb 14.8, SCr 0.76, K 4.8, ALT 17   Risk Assessment/Calculations:             Physical Exam:   VS:  BP 112/80 (BP Location: Right Arm, Patient Position: Sitting, Cuff Size: Normal)   Pulse 70   Resp 16   Ht 5' 8 (1.727 m)   Wt 146 lb (66.2 kg)   SpO2 97%   BMI 22.20 kg/m    Wt Readings from Last 3 Encounters:  08/04/23 146 lb (66.2 kg)  08/03/23 145 lb 3.2 oz (65.9 kg)  03/26/23 150 lb (68 kg)    Constitutional:      Appearance: Healthy appearance. Not in distress.  Neck:     Vascular: No carotid bruit. JVD normal.  Pulmonary:     Breath sounds: Normal breath sounds. No wheezing. No rales.  Cardiovascular:     Normal rate. Regular rhythm.     Murmurs: There is no murmur.  Edema:    Peripheral edema absent.  Abdominal:     Palpations: Abdomen is soft.         Assessment and Plan:   Assessment & Plan Precordial chest pain No recurrence since last year. ETT-Echocardiogram was low risk. No further testing needed.  Pulmonary sarcoidosis (HCC) Follow up with pulmonary as planned. She had an EKG in 03/2023 that did not show evidence of conduction system disease. She has not had syncope. ETT-Echocardiogram in 09/2022 was low risk. She has not had heart failure symptoms or syncope. She does not need testing for cardiac sarcoid at this time.  Essential hypertension The patient's blood pressure is controlled on her current regimen.  Continue current therapy.  Recent creatinine normal.  Mixed hyperlipidemia Managed by primary care. Her triglycerides are high, HDL is low. She recently started on fish oil for her elevated triglycerides. She has a 10 year ASCVD risk of 3.3%. She does not require statin Rx. She does have a FHx of CAD. We discussed getting a CAC score. But she prefers to avoid unnecessary radiation. She had a CTA in 07/2022. The report notes no aortic calcification. A CAC score could be consider in 3-5 years.  Dispo:  Return in about 1 year (around 08/03/2024) for Routine Follow Up, w/ Dr. Santo.  Signed, Glendia Ferrier, PA-C

## 2023-08-04 NOTE — Assessment & Plan Note (Signed)
 Managed by primary care. Her triglycerides are high, HDL is low. She recently started on fish oil for her elevated triglycerides. She has a 10 year ASCVD risk of 3.3%. She does not require statin Rx. She does have a FHx of CAD. We discussed getting a CAC score. But she prefers to avoid unnecessary radiation. She had a CTA in 07/2022. The report notes no aortic calcification. A CAC score could be consider in 3-5 years.

## 2023-08-04 NOTE — Assessment & Plan Note (Signed)
 No recurrence since last year. ETT-Echocardiogram was low risk. No further testing needed.

## 2023-08-04 NOTE — Assessment & Plan Note (Signed)
 The patient's blood pressure is controlled on her current regimen.  Continue current therapy.  Recent creatinine normal.

## 2023-08-04 NOTE — Patient Instructions (Signed)
 Medication Instructions:  Your physician recommends that you continue on your current medications as directed. Please refer to the Current Medication list given to you today.  *If you need a refill on your cardiac medications before your next appointment, please call your pharmacy*   Lab Work: None ordered  If you have labs (blood work) drawn today and your tests are completely normal, you will receive your results only by: MyChart Message (if you have MyChart) OR A paper copy in the mail If you have any lab test that is abnormal or we need to change your treatment, we will call you to review the results.   Testing/Procedures: None ordered   Follow-Up: At [facility], you and your health needs are our priority.  As part of our continuing mission to provide you with exceptional heart care, we have created designated Provider Care Teams.  These Care Teams include your primary Cardiologist (physician) and Advanced Practice Providers (APPs -  Physician Assistants and Nurse Practitioners) who all work together to provide you with the care you need, when you need it.  We recommend signing up for the patient portal called MyChart.  Sign up information is provided on this After Visit Summary.  MyChart is used to connect with patients for Virtual Visits (Telemedicine).  Patients are able to view lab/test results, encounter notes, upcoming appointments, etc.  Non-urgent messages can be sent to your provider as well.   To learn more about what you can do with MyChart, go to [email or web address].    Your next appointment:   12 month(s)  Provider:   Stanly DELENA Leavens, MD  or Glendia Ferrier, PA-C         Other Instructions   1st Floor: - Lobby - Registration  - Pharmacy  - Lab - Cafe  2nd Floor: - PV Lab - Diagnostic Testing (echo, CT, nuclear med)  3rd Floor: - Vacant  4th Floor: - TCTS (cardiothoracic surgery) - AFib Clinic - Structural Heart Clinic -  Vascular Surgery  - Vascular Ultrasound  5th Floor: - HeartCare Cardiology (general and EP) - Clinical Pharmacy for coumadin, hypertension, lipid, weight-loss medications, and med management appointments    Valet parking services will be available as well.

## 2023-11-19 ENCOUNTER — Other Ambulatory Visit: Payer: Self-pay

## 2023-11-19 ENCOUNTER — Ambulatory Visit (HOSPITAL_COMMUNITY)
Admission: EM | Admit: 2023-11-19 | Discharge: 2023-11-19 | Disposition: A | Discharge status: Home / Self Care | Attending: Emergency Medicine | Admitting: Emergency Medicine

## 2023-11-19 ENCOUNTER — Encounter (HOSPITAL_COMMUNITY): Payer: Self-pay | Admitting: *Deleted

## 2023-11-19 DIAGNOSIS — H9201 Otalgia, right ear: Secondary | ICD-10-CM | POA: Diagnosis present

## 2023-11-19 DIAGNOSIS — J029 Acute pharyngitis, unspecified: Secondary | ICD-10-CM | POA: Diagnosis present

## 2023-11-19 NOTE — Discharge Instructions (Addendum)
 For sore throat you can alternate between 800 mg of ibuprofen and 500 mg of Tylenol  every 4-6 hours.  Warm saline gargles, tea with honey and sleeping with a humidifier can further help with your sore throat.  Symptoms should improve over the weekend and into next week.  If no improvement or any changes return to clinic for reevaluation.  Your rapid strep test was negative, resending this out for culture we will contact you if antibiotics are indicated.  I think it is a good idea to wear a mask around your mother until you start to feel better.

## 2023-11-19 NOTE — ED Provider Notes (Signed)
 [facility]    CSN: [id number] Arrival date & time: 11/19/23  1809      History   Chief Complaint Chief Complaint  Patient presents with   Sore Throat   Otalgia    HPI Kristin [name] is a 58 y.o. female.   Patient presents to clinic over concerns of right ear pain and right-sided throat pain for the past 4 days.  Has not had any fever.  Has not any drainage from the ear.  Has not had any cough.  Routinely has rhinorrhea, no changes to this recently.  Has not tried any medications or tried any interventions for her symptoms.  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  Some parents of the children last week did test positive for strep throat.  The history is provided by the patient and medical records.  Sore Throat  Otalgia   Past Medical History:  Diagnosis Date   Anxiety    Dizziness    Dysrhythmia    palpitations   Fibromyalgia 04/13/2017   GERD (gastroesophageal reflux disease)    Headache    History of migraine headaches    Hypertension    hx arrhythmia   IBS (irritable bowel syndrome)    Lymphadenopathy    Seasonal allergies    Thyroid  nodule    Vertigo    Vitamin D deficiency     Patient Active Problem List   Diagnosis Date Noted   Precordial chest pain 08/25/2022   SOB (shortness of breath) 08/25/2022   Mixed hyperlipidemia 08/25/2022   Pulmonary sarcoidosis (HCC) 05/28/2017   Multinodular goiter 04/29/2017   Essential hypertension    History of migraine headaches    Vertigo    Seasonal allergies    IBS (irritable bowel syndrome)    Vitamin D deficiency    GERD (gastroesophageal reflux disease)    Dizziness    Fibromyalgia 04/13/2017   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 Jennette Moder .Aaron AasAaron AasAaron AasNEG   VAGINAL HYSTERECTOMY  12/2017   LAPAROSCOPICALLY ASSISTED/DR. ELIZABETH SKINNER   VIDEO ASSISTED THORACOSCOPY (VATS)/ LYMPH NODE SAMPLING Left 05/28/2017   Procedure: LEFT PERISTERNAL EXPLORATION, LEFT VIDEO ASSISTED THORACOSCOPY (VATS), MEDIASTINAL AND AP WINDOW LYMPH NODE DISSECTION;  Surgeon: Norita Beauvais, MD;  Location: [facility];  Service: Thoracic;  Laterality: Left;   VIDEO BRONCHOSCOPY WITH ENDOBRONCHIAL ULTRASOUND N/A 05/17/2017   Procedure: VIDEO BRONCHOSCOPY WITH ENDOBRONCHIAL ULTRASOUND WITH TRANSBRONCHIAL BX OF LYMPH NODES 7, 10 R AND 10 L.;  Surgeon: Norita Beauvais, MD;  Location: [facility];  Service: Thoracic;  Laterality: N/A;    OB History   No obstetric history on file.      Home Medications    Prior to Admission medications   Medication Sig Start Date End Date Taking? Authorizing Provider  acetaminophen  (TYLENOL ) 500 MG tablet Take 500-1,000 mg every 8 (eight) hours as needed by mouth for mild pain (depends on pain if takes 12 tablets).    Yes [provider]  albuterol  (PROAIR  HFA) 108 (90 Base) MCG/ACT inhaler Inhale 2 puffs into the lungs every 4 (four) hours as needed for wheezing or shortness  of breath. 06/02/19 11/19/23 Yes Josiah Nigh, MD  aluminum-magnesium hydroxide-simethicone (MAALOX) 200-200-20 MG/5ML SUSP Take 30 mLs by mouth 2 (two) times daily as needed.   Yes [provider]  Ascorbic Acid (VITAMIN C) 1000 MG tablet every other day.   Yes [provider]  atenolol (TENORMIN) 25 MG tablet Take by mouth daily.   Yes [provider]  Cholecalciferol (VITAMIN D3) 5000 units CAPS Take 1,000 Units by mouth daily.   Yes [provider]  frovatriptan (FROVA) 2.5 MG tablet Take 2.5 mg as needed by mouth for migraine (take 1 tablet at onset of migraine and repeat up to twice if  needed). If recurs, may repeat after 2 hours. Max of 3 tabs in 24 hours.    Yes [provider]  loperamide (IMODIUM) 2 MG capsule Take 2-4 mg as needed by mouth for diarrhea or loose stools.   Yes [provider]  loratadine (CLARITIN) 10 MG tablet Take 10 mg by mouth as needed for allergies.   Yes [provider]  Multiple Vitamin (MULTIVITAMIN) tablet Take 1 tablet by mouth daily.   Yes [provider]  Omega-3 Fatty Acids (FISH OIL) 1000 MG CAPS 2 capsules daily at 6 (six) AM.   Yes [provider]  sodium chloride  (OCEAN) 0.65 % SOLN nasal spray Place 1 spray as needed into both nostrils for congestion.   Yes [provider]  tretinoin (RETIN-A) 0.05 % cream daily at 6 (six) AM. 04/03/22  Yes [provider]  Artificial Tear Solution (SOOTHE XP OP) Apply 2 drops at bedtime to eye.    [provider]    Family History Family History  Problem Relation Age of Onset   Hypertension Mother    Diabetes Mother    Hyperthyroidism Mother    Sleep apnea Mother    Hypertension Father    Heart disease Father    Hypothyroidism Father    Cardiomyopathy Father    Other Father 47       CAUSE OF DEATH..CEREBRAL HEMORR   Sleep apnea Sister    Diabetes Maternal Grandmother    Hypertension Maternal Grandmother    Heart disease Maternal Grandmother        MI   Other Maternal Grandmother        GRAVES DISEASE   Cardiomyopathy Maternal Grandmother    Other Maternal Grandfather        AAA   Cardiomyopathy Maternal Grandfather    Heart disease Maternal Grandfather        MI   Cancer Paternal Grandmother        BREAST, OTHER UNSPECIFIED SITE   Breast cancer Paternal Grandmother    Hypertension Paternal Grandfather    Stroke Paternal Grandfather        ISCHEMIC STROKE   Other Maternal Aunt        THYROID  DISEASE   Heart disease Paternal Aunt        CABG   Cardiomyopathy Paternal Aunt    Cancer Paternal Aunt        STOMACH    Cardiomyopathy Paternal Uncle    Heart disease Paternal Uncle        CABG   Other Cousin        THYROID     Breast cancer Cousin     Social History Social History   Tobacco Use   Smoking status: Never   Smokeless tobacco: Never  Vaping Use   Vaping status: Never Used  Substance Use  Topics   Alcohol use: No   Drug use: No     Allergies   Amoxicillin, Dicyclomine, Doxycycline, Indocin [indomethacin], Levaquin [levofloxacin], Probiotic [bacid], Bacillus coagulans-inulin, Bentyl [dicyclomine hcl], Erythromycin, Keflex [cephalexin], Lansoprazole, Nexium [esomeprazole], Sudafed [pseudoephedrine hcl], Sulfamethoxazole, and Tetanus toxoids   Review of Systems Review of Systems  Per HPI  Physical Exam Triage Vital Signs ED Triage Vitals  Encounter Vitals Group     BP 11/19/23 1844 (!) 151/94     Systolic BP Percentile --      Diastolic BP Percentile --      Pulse Rate 11/19/23 1844 61     Resp 11/19/23 1844 20     Temp 11/19/23 1844 98.3 F (36.8 C)     Temp src --      SpO2 11/19/23 1844 97 %     Weight --      Height --      Head Circumference --      Peak Flow --      Pain Score 11/19/23 1841 5     Pain Loc --      Pain Education --      Exclude from Growth Chart --    No data found.  Updated Vital Signs BP (!) 151/94   Pulse 61   Temp 98.3 F (36.8 C)   Resp 20   SpO2 97%   Visual Acuity Right Eye Distance:   Left Eye Distance:   Bilateral Distance:    Right Eye Near:   Left Eye Near:    Bilateral Near:     Physical Exam Vitals and nursing note reviewed.  Constitutional:      Appearance: She is well-developed.  HENT:     Head: Normocephalic and atraumatic.     Right Ear: Ear canal normal. A middle ear effusion is present.     Left Ear: Tympanic membrane and ear canal normal.     Nose: Rhinorrhea present. No congestion.     Mouth/Throat:     Mouth: Mucous membranes are moist.     Pharynx: Uvula midline. Posterior oropharyngeal erythema  present.     Tonsils: No tonsillar exudate or tonsillar abscesses. 0 on the right. 0 on the left.  Cardiovascular:     Rate and Rhythm: Normal rate and regular rhythm.     Heart sounds: Normal heart sounds. No murmur heard. Pulmonary:     Effort: Pulmonary effort is normal. No respiratory distress.     Breath sounds: Normal breath sounds. No wheezing.  Lymphadenopathy:     Cervical: Cervical adenopathy present.  Skin:    General: Skin is warm.  Neurological:     General: No focal deficit present.     Mental Status: She is alert.  Psychiatric:        Mood and Affect: Mood normal.      UC Treatments / Results  Labs (all labs ordered are listed, but only abnormal results are displayed) Labs Reviewed  CULTURE, GROUP A STREP [facility])  POCT RAPID STREP A (OFFICE)    EKG   Radiology No results found.  Procedures Procedures (including critical care time)  Medications Ordered in UC Medications - No data to display  Initial Impression / Assessment and Plan / UC Course  I have reviewed the triage vital signs and the nursing notes.  Pertinent labs & imaging results that were available during my care of the patient were reviewed by me and considered in my medical decision making (see chart for  details).  Vitals in triage reviewed, patient is hemodynamically stable.  Lungs are vesicular, regular rate and rhythm.  Posterior pharynx with erythema, uvula midline, tonsils without exudate or swelling.  Bilateral tympanic membranes are pearly gray, right middle ear effusion.  Without concern for otitis media or otitis externa.  POC rapid strep negative, will send for culture.  Will treat as viral pharyngitis.   Plan of care, follow-up care return precautions given, no questions at this time.     Final Clinical Impressions(s) / UC Diagnoses   Final diagnoses:  Otalgia of right ear  Acute pharyngitis, unspecified etiology     Discharge Instructions      For sore throat you  can alternate between 800 mg of ibuprofen and 500 mg of Tylenol  every 4-6 hours.  Warm saline gargles, tea with honey and sleeping with a humidifier can further help with your sore throat.  Symptoms should improve over the weekend and into next week.  If no improvement or any changes return to clinic for reevaluation.  Your rapid strep test was negative, resending this out for culture we will contact you if antibiotics are indicated.  I think it is a good idea to wear a mask around your mother until you start to feel better.    ED Prescriptions   None    PDMP not reviewed this encounter.   Harlow Lighter Ronit Marczak  N, FNP 11/19/23 1924

## 2023-11-19 NOTE — ED Triage Notes (Signed)
 PT reports Rt ear pain and sore throat since Tuesday. Pt reports when she swallows the RT ear and throat hurt.

## 2023-11-22 ENCOUNTER — Ambulatory Visit (HOSPITAL_COMMUNITY): Payer: Self-pay

## 2023-11-22 LAB — CULTURE, GROUP A STREP (THRC)

## 2023-11-23 ENCOUNTER — Other Ambulatory Visit: Payer: Self-pay | Admitting: Obstetrics and Gynecology

## 2023-11-23 DIAGNOSIS — Z1231 Encounter for screening mammogram for malignant neoplasm of breast: Secondary | ICD-10-CM

## 2023-12-23 ENCOUNTER — Ambulatory Visit
Admission: RE | Admit: 2023-12-23 | Discharge: 2023-12-23 | Disposition: A | Discharge status: Home / Self Care | Source: Ambulatory Visit | Attending: Obstetrics and Gynecology | Admitting: Obstetrics and Gynecology

## 2023-12-23 DIAGNOSIS — Z1231 Encounter for screening mammogram for malignant neoplasm of breast: Secondary | ICD-10-CM

## 2023-12-24 ENCOUNTER — Encounter (INDEPENDENT_AMBULATORY_CARE_PROVIDER_SITE_OTHER): Payer: BC Managed Care – PPO | Admitting: Ophthalmology

## 2024-01-17 ENCOUNTER — Encounter (INDEPENDENT_AMBULATORY_CARE_PROVIDER_SITE_OTHER): Admitting: Ophthalmology

## 2024-01-31 ENCOUNTER — Ambulatory Visit: Payer: BC Managed Care – PPO | Admitting: Diagnostic Neuroimaging

## 2024-02-09 ENCOUNTER — Encounter (INDEPENDENT_AMBULATORY_CARE_PROVIDER_SITE_OTHER): Admitting: Ophthalmology

## 2024-02-09 DIAGNOSIS — H35033 Hypertensive retinopathy, bilateral: Secondary | ICD-10-CM | POA: Diagnosis not present

## 2024-02-09 DIAGNOSIS — I10 Essential (primary) hypertension: Secondary | ICD-10-CM | POA: Diagnosis not present

## 2024-02-09 DIAGNOSIS — H33303 Unspecified retinal break, bilateral: Secondary | ICD-10-CM | POA: Diagnosis not present

## 2024-02-09 DIAGNOSIS — H43813 Vitreous degeneration, bilateral: Secondary | ICD-10-CM | POA: Diagnosis not present

## 2024-02-09 DIAGNOSIS — H2513 Age-related nuclear cataract, bilateral: Secondary | ICD-10-CM

## 2024-02-29 ENCOUNTER — Ambulatory Visit (INDEPENDENT_AMBULATORY_CARE_PROVIDER_SITE_OTHER): Admitting: Diagnostic Neuroimaging

## 2024-02-29 ENCOUNTER — Encounter: Payer: Self-pay | Admitting: Diagnostic Neuroimaging

## 2024-02-29 VITALS — BP 132/80 | HR 68 | Ht 69.0 in | Wt 146.2 lb

## 2024-02-29 DIAGNOSIS — R253 Fasciculation: Secondary | ICD-10-CM | POA: Diagnosis not present

## 2024-02-29 NOTE — Progress Notes (Signed)
 [facility]  PATIENT: Kristin [name] DOB: 15-Jul-1965  REFERRING CLINICIAN: Meredeth Prentice KIDD, MD HISTORY FROM: patient REASON FOR VISIT: follow up   HISTORICAL  CHIEF COMPLAINT:  Chief Complaint  Patient presents with   Follow-up    RM 6, Pt alone, here for f/u with muscle twitches. Pt states it was better in Feb and Mar in Apr stated to come back intermittently but doesn't think it has been as bad as in Dec and Jan.    HISTORY OF PRESENT ILLNESS:  UPDATE (02/29/24, VRP): Since last visit, doing better. Sxs improved in Feb / March 2025. Slight increase in April 2025 with increased stress, but still mild. Intermittent twitching sensations. Tremor is minimal.   UPDATE (08/03/23, VRP): Since last visit, doing well until Sept 2024. Had covid, then had more lip swelling in Oct 2024, then more swelling in face and eyes in Nov 2024. Then had more tremors and twitching in [id number] (in setting of prednisone at end of Nov). Now slightly improved.   UPDATE (02/23/22, VRP): 58 year old female with numbness.   January 05, 2022 --> numbness at work (calves, arms, face; over the course of a day; similar to prior events). Also malaise x 1 week prior. Muscle fatigue sensation. Since then has improved. Some excessive daytime sleepiness. Some fibromyalgia type symptoms.  PRIOR HPI (04/11/20, Dr. Jenel):  Kristin [name] is a 58 year old right-handed white female with a history of migraine headaches.  The patient has irritable bowel syndrome and fibromyalgia type symptoms.  There is some question whether she has sarcoidosis, but it appears that her pulmonologist now questions this diagnosis.  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  The studies were normal.  The patient returns today with a 1 month history of a heavy feeling in the head on the right side, sometimes associated with lightheaded sensations.  The patient does have a prior history of vertigo off and on.  She claims that her migraine headaches usually occur on the left side of the head and spread to the frontal areas, may be severe once or twice a month where she has to take Frova which is helpful.  The patient has some form of headache almost every day however.  The patient denies any recent medication changes.  The patient does report some photophobia and phonophobia with the headache, she denies any nausea or vomiting.  She returns to the office today for an evaluation.   REVIEW OF SYSTEMS: Full 14 system review of systems performed and negative with exception of: as per HHPI.  ALLERGIES: Allergies  Allergen Reactions   Amoxicillin Hives    Has patient had a PCN reaction causing immediate rash, facial/tongue/throat swelling, SOB or lightheadedness with hypotension: UNKNOWN  Has patient had a PCN reaction causing severe rash involving mucus membranes or skin necrosis:  #  #  #  NO  #  #  #  Has patient had a PCN reaction that required hospitalization  #  #  #  NO  #  #  #  Has patient had a PCN reaction occurring within the last 10 years: #  #  #  YES  #  #  #  If all NO's, may proceed with Cephalosporin use.   Dicyclomine     Other Reaction(s): diarrhea, Other   Doxycycline Hives   Indocin [Indomethacin] Hives   Levaquin [Levofloxacin] Hives   Probiotic [Bacid] Hives   Bacillus Coagulans-Inulin     Other Reaction(s): hives   Bentyl [Dicyclomine Hcl] Diarrhea   Erythromycin Nausea Only    STOMACH CRAMPS   Keflex [Cephalexin] Rash   Lansoprazole Other (See Comments)    HEADACHE   Nexium [Esomeprazole] Hives and Other (See Comments)    INTOLERANCE >  HEADACHE    Sudafed [Pseudoephedrine Hcl] Other (See Comments)    TACHYCARDIA    Sulfamethoxazole Rash   Tetanus Toxoids Other (See Comments)    LOCAL REACTION...REDNESS/KNOT    HOME MEDICATIONS: Outpatient Medications Prior to Visit  Medication Sig Dispense Refill   acetaminophen  (TYLENOL ) 500 MG tablet Take 500-1,000 mg every 8 (eight) hours as needed by mouth for mild pain (depends on pain if takes 12 tablets).      albuterol  (PROAIR  HFA) 108 (90 Base) MCG/ACT inhaler Inhale 2 puffs into the lungs every 4 (four) hours as needed for wheezing or shortness of breath. 8 g 5   aluminum-magnesium hydroxide-simethicone (MAALOX) 200-200-20 MG/5ML SUSP Take 30 mLs by mouth 2 (two) times daily as needed.     Artificial Tear Solution (SOOTHE XP OP) Apply 2 drops at bedtime to eye.     Ascorbic Acid (VITAMIN C) 1000 MG tablet every other day. (Patient taking differently: Take 1,000 mg by mouth daily.)     atenolol (TENORMIN) 25 MG tablet Take by mouth daily.     B Complex-C (SUPER B COMPLEX PO) Take 1 tablet by mouth daily.     Cholecalciferol (VITAMIN D3) 5000 units CAPS Take 1,000 Units by mouth daily.     frovatriptan (FROVA) 2.5 MG tablet Take 2.5 mg as needed by mouth for migraine (take 1 tablet at onset of migraine and repeat up to twice if needed). If recurs, may repeat after 2 hours. Max of 3 tabs in 24 hours.      Garlic (GARLIQUE PO) Take 1 capsule by mouth daily.     loperamide (IMODIUM) 2 MG capsule Take 2-4 mg as needed by mouth for diarrhea or loose stools.     loratadine (CLARITIN) 10 MG tablet Take 10 mg by mouth as needed for allergies.     Multiple Vitamin (MULTIVITAMIN) tablet Take 1 tablet by mouth daily.     Omega-3 Fatty Acids (FISH OIL) 1000 MG CAPS 2 capsules daily at 6 (six) AM.     sodium chloride  (OCEAN) 0.65 % SOLN nasal spray Place 1 spray as needed into both nostrils for congestion.     tretinoin (RETIN-A) 0.05 % cream daily at 6 (six) AM.     No facility-administered medications prior to visit.    PAST MEDICAL HISTORY: Past Medical History:   Diagnosis Date   Anxiety    Dizziness    Dysrhythmia    palpitations   Fibromyalgia 04/13/2017   GERD (gastroesophageal reflux disease)    Headache    History of migraine headaches    Hypertension    hx arrhythmia   IBS (irritable bowel syndrome)    Lymphadenopathy    Seasonal allergies    Thyroid  nodule    Vertigo    Vitamin D deficiency     PAST SURGICAL HISTORY: Past Surgical History:  Procedure Laterality Date   DILATION AND CURETTAGE OF UTERUS  2006, 2009   EYE  SURGERY Bilateral 1997   LASER   THYOID NODULE  05/2009   FNA...DR. LINDELL .SABRASABRASABRANEG   VAGINAL HYSTERECTOMY  12/2017   LAPAROSCOPICALLY ASSISTED/DR. ELIZABETH SKINNER   VIDEO ASSISTED THORACOSCOPY (VATS)/ LYMPH NODE SAMPLING Left 05/28/2017   Procedure: LEFT PERISTERNAL EXPLORATION, LEFT VIDEO ASSISTED THORACOSCOPY (VATS), MEDIASTINAL AND AP WINDOW LYMPH NODE DISSECTION;  Surgeon: Army Dallas NOVAK, MD;  Location: [facility];  Service: Thoracic;  Laterality: Left;   VIDEO BRONCHOSCOPY WITH ENDOBRONCHIAL ULTRASOUND N/A 05/17/2017   Procedure: VIDEO BRONCHOSCOPY WITH ENDOBRONCHIAL ULTRASOUND WITH TRANSBRONCHIAL BX OF LYMPH NODES 7, 10 R AND 10 L.;  Surgeon: Army Dallas NOVAK, MD;  Location: [facility];  Service: Thoracic;  Laterality: N/A;    FAMILY HISTORY: Family History  Problem Relation Age of Onset   Hypertension Mother    Diabetes Mother    Hyperthyroidism Mother    Sleep apnea Mother    Hypertension Father    Heart disease Father    Hypothyroidism Father    Cardiomyopathy Father    Other Father 28       CAUSE OF DEATH..CEREBRAL HEMORR   Sleep apnea Sister    Other Maternal Aunt        THYROID  DISEASE   Heart disease Paternal Aunt        CABG   Cardiomyopathy Paternal Aunt    Stomach cancer Paternal Aunt    Cardiomyopathy Paternal Uncle    Heart disease Paternal Uncle        CABG   Diabetes Maternal Grandmother    Hypertension Maternal Grandmother    Heart disease Maternal Grandmother         MI   Other Maternal Grandmother        GRAVES DISEASE   Cardiomyopathy Maternal Grandmother    Other Maternal Grandfather        AAA   Cardiomyopathy Maternal Grandfather    Heart disease Maternal Grandfather        MI   Cancer Paternal Grandmother        UNSPECIFIED SITE   Breast cancer Paternal Grandmother 78 - 25   Hypertension Paternal Grandfather    Stroke Paternal Grandfather        ISCHEMIC STROKE   Other Cousin        THYROID     Breast cancer Cousin 31 - 11    SOCIAL HISTORY: Social History   Socioeconomic History   Marital status: Single    Spouse name: Not on file   Number of children: 0   Years of education: Not on file   Highest education level: Not on file  Occupational History   Not on file  Tobacco Use   Smoking status: Never   Smokeless tobacco: Never  Vaping Use   Vaping status: Never Used  Substance and Sexual Activity   Alcohol use: No   Drug use: No   Sexual activity: Never    Partners: Male  Other Topics Concern   Not on file  Social History Narrative   Right handed    Caffiene soda small can, sweet tea in evening.  During day drinking water.   Education: college dr   Child care.    Social Drivers of Corporate investment banker Strain: Not on file  Food Insecurity: No Food Insecurity (06/19/2022)   Received from [facility]   Hunger Vital Sign    Within the past 12 months, you worried that your food would run out before you got the money to buy more.:  Never true    Within the past 12 months, the food you bought just didn't last and you didn't have money to get more.: Never true  Transportation Needs: Not on file  Physical Activity: Not on file  Stress: Not on file  Social Connections: Unknown (11/03/2021)   [facility]   Social Network    Social Network: Not on file  Intimate Partner Violence: Unknown (10/01/2021)   Received from [facility]   HITS    Physically Hurt: Not on file    Insult or Talk Down To: Not on  file    Threaten Physical Harm: Not on file    Scream or Curse: Not on file     PHYSICAL EXAM  GENERAL EXAM/CONSTITUTIONAL: Vitals:  Vitals:   02/29/24 1615  BP: 132/80  Pulse: 68  Weight: 146 lb 3.2 oz (66.3 kg)  Height: 5' 9 (1.753 m)    Body mass index is 21.59 kg/m. Wt Readings from Last 3 Encounters:  02/29/24 146 lb 3.2 oz (66.3 kg)  08/04/23 146 lb (66.2 kg)  08/03/23 145 lb 3.2 oz (65.9 kg)   Patient is in no distress; well developed, nourished and groomed; neck is supple  CARDIOVASCULAR: Examination of carotid arteries is normal; no carotid bruits Regular rate and rhythm, no murmurs Examination of peripheral vascular system by observation and palpation is normal  EYES: Ophthalmoscopic exam of optic discs and posterior segments is normal; no papilledema or hemorrhages No results found.  MUSCULOSKELETAL: Gait, strength, tone, movements noted in Neurologic exam below  NEUROLOGIC: MENTAL STATUS:      No data to display         awake, alert, oriented to person, place and time recent and remote memory intact normal attention and concentration language fluent, comprehension intact, naming intact fund of knowledge appropriate  CRANIAL NERVE:  2nd - no papilledema on fundoscopic exam 2nd, 3rd, 4th, 6th - pupils equal and reactive to light, visual fields full to confrontation, extraocular muscles intact, no nystagmus 5th - facial sensation symmetric 7th - facial strength symmetric 8th - hearing intact 9th - palate elevates symmetrically, uvula midline 11th - shoulder shrug symmetric 12th - tongue protrusion midline  MOTOR:  normal bulk and tone, full strength in the BUE, BLE  SENSORY:  normal and symmetric to light touch, temperature, vibration  COORDINATION:  finger-nose-finger, fine finger movements normal  REFLEXES:  deep tendon reflexes TRACE and symmetric  GAIT/STATION:  narrow based gait     DIAGNOSTIC DATA (LABS, IMAGING,  TESTING) - I reviewed patient records, labs, notes, testing and imaging myself where available.  Lab Results  Component Value Date   WBC 8.1 03/26/2023   HGB 14.9 03/26/2023   HCT 44.0 03/26/2023   MCV 83.2 03/26/2023   PLT 233 03/26/2023      Component Value Date/Time   NA 140 03/26/2023 1502   NA 142 01/19/2019 1618   K 3.6 03/26/2023 1502   CL 101 03/26/2023 1502   CO2 29 03/26/2023 1502   GLUCOSE 107 (H) 03/26/2023 1502   BUN 13 03/26/2023 1502   BUN 17 01/19/2019 1618   CREATININE 0.67 03/26/2023 1502   CALCIUM 9.5 03/26/2023 1502   PROT 7.5 03/26/2023 1502   PROT 6.7 02/23/2022 1008   ALBUMIN 4.4 03/26/2023 1502   ALBUMIN 4.8 01/19/2019 1618   AST 15 03/26/2023 1502   ALT 13 03/26/2023 1502   ALKPHOS 75 03/26/2023 1502   BILITOT 0.6 03/26/2023 1502   BILITOT 0.3  01/19/2019 1618   GFRNONAA >60 03/26/2023 1502   GFRAA 95 01/19/2019 1618   No results found for: CHOL, HDL, LDLCALC, LDLDIRECT, TRIG, CHOLHDL Lab Results  Component Value Date   HGBA1C 6.1 (H) 02/23/2022   Lab Results  Component Value Date   VITAMINB12 1,050 02/23/2022   Lab Results  Component Value Date   TSH 0.582 02/23/2022    Component Ref Range & Units 1 mo ago  Free T4 0.82 - 1.77 ng/dL 8.57   Component Ref Range & Units 1 mo ago  TSH 0.450 - 4.50 uIU/mL 0.806     02/08/19  Unremarkable MRI scan of the brain without contrast.   06/23/22  HST --> OSA (obstructive sleep apnea), mild     ASSESSMENT AND PLAN  58 y.o. year old female here with:   Dx:  1. 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    EDUARD FABIENE HANLON, MD 02/29/2024, 4:39 PM Certified in Neurology, Neurophysiology and Neuroimaging  [facility] [address], [address] [place], [place] [address] [phone]

## 2024-03-31 ENCOUNTER — Telehealth: Payer: Self-pay | Admitting: Diagnostic Neuroimaging

## 2024-03-31 NOTE — Telephone Encounter (Signed)
 Pt called to request for nurse to give PT c callback the patient  would like to discuss if  it is   safe for PT to get flu vaccines due to having Muscle twitching . Pt had appt today with PCP, PcP informed pt to follow up with Neurologist due to pervious encounter with having flu vaccine .

## 2024-04-04 NOTE — Telephone Encounter (Signed)
 I called and LMVM for pt to return call about her flu vaccine.  I see note from Dr. Meredeth 03/31/2024 flu shot - Vaccine not given due to 3 yes responses on flu shot questionnaire.

## 2024-04-05 NOTE — Telephone Encounter (Signed)
 What was the previous issue with flu vaccine? _VRP Margaret Eduard SAUNDERS, MD to Me     04/03/24  1:05 PM Ok for vaccine from neurology standpoint. She should discuss with PCP re: other safety consideration. -VRP  Pt returned call. I told her that Dr. Margaret did ok to get flu vacc from Neuro standpoint but then asked what was issue with it?   She said that 1) when had tetanus one time (Red swollen hot in arm that got vacc)    2)  had flu vacc reaction last year after having covid, reaction of_increased HR, not feel good (did not tell pcp). 3)  told pcp this time seeing neuro for muscle twitching since 07/2023 (benign).  She did note that she has more twitching in am and when wakes to go to RR in night (calves and lower legs).

## 2024-04-05 NOTE — Telephone Encounter (Signed)
 I called pt.  I relayed that per listed effects from previous vaccinations: Dr. Margaret stated sounded like general SE from the vacc (not neurologic).  Discuss with pcp. She will speak to pcp.

## 2024-04-05 NOTE — Telephone Encounter (Signed)
 Ok, sounds like a general side effect from the vaccine (not a neurologic issue). This should be addressed by PCP. -VRP  I called and relayed to pt via VM home,  did not leave all message as VM dropped.  She if she calls back this is what he said.  Ok per neurology to get flu vacc, other issues per pcp.

## 2024-04-10 ENCOUNTER — Telehealth: Payer: Self-pay | Admitting: Diagnostic Neuroimaging

## 2024-04-10 NOTE — Telephone Encounter (Signed)
 Pt is asking for a call from RN to discus her going to her PCP this morning re: a change in her essential tremor, please call.Pt asking if it is a call back today to call her at [phone] if it is a call back tomorrow call her at work [phone]

## 2024-04-11 NOTE — Telephone Encounter (Signed)
 Called pt at work [phone], coworker answered and said patient goes to lunch around 1:30. Will try to call around then.

## 2024-04-11 NOTE — Telephone Encounter (Signed)
 Pt returned call. Please call back when available. Pt stated she will be at work number [phone] till 5:30pm.

## 2024-04-11 NOTE — Telephone Encounter (Signed)
 I spoke with the patient. She says she is better today than she felt over the weekend/yesterday.  Last Tuesday her essential tremor changed as she noticed that when stretched her fingers out and spread them wide, they would shake from side-to-side when she let them go.  Initially did not affect her ability to function or write.  This past Sunday she noticed the figer tremors more when she was trying to work on her computer and write.  Also felt shaky inside which she states has happened before but not much (says she has told Dr Margaret about this).  She saw her primary care yesterday who drew multiple labs (results pending per pt) and felt her exam looked benign but encouraged her to call neurology to let us  know as it may give her peace of mind.  She also said her pcp suggested changing her atenolol to propranolol but she has not done this yet because she wanted to see what Dr. Margaret thought.  She also has recent 6 to 7 pound weight loss and has not been eating very well the last 2 weeks.  She states today her appetite is better.  She does not have a fever and she denies any known UTI symptoms.   She also states she has been getting up a lot at night to use the bathroom and she has scheduled a sleep follow-up with Dr. Buck for next February but is on the wait list.  She is interested in a repeat sleep study.  She is willing to see NP if able. Last saw Dr Buck in 2023.

## 2024-04-11 NOTE — Telephone Encounter (Signed)
 Okay to see nurse practitioner for sleep follow-up.

## 2024-04-18 NOTE — Telephone Encounter (Signed)
 I see pt is now scheduled with Sarah NP on 04/26/24.

## 2024-04-26 ENCOUNTER — Encounter: Payer: Self-pay | Admitting: Neurology

## 2024-04-26 ENCOUNTER — Ambulatory Visit: Admitting: Neurology

## 2024-04-26 VITALS — BP 137/84 | HR 64 | Ht 69.0 in | Wt 145.5 lb

## 2024-04-26 DIAGNOSIS — R400 Somnolence: Secondary | ICD-10-CM | POA: Insufficient documentation

## 2024-04-26 DIAGNOSIS — G4730 Sleep apnea, unspecified: Secondary | ICD-10-CM | POA: Diagnosis not present

## 2024-04-26 DIAGNOSIS — G4733 Obstructive sleep apnea (adult) (pediatric): Secondary | ICD-10-CM | POA: Diagnosis not present

## 2024-04-26 NOTE — Patient Instructions (Signed)
 Great to meet you today! Check sleep study, will order home sleep study and in lab, will see what insurance prefers Keep next visit with Dr. Margaret  Will be in touch after sleep study. Thanks!!

## 2024-04-26 NOTE — Progress Notes (Signed)
 Patient: Kristin [name] Date of Birth: Apr 24, 1966  Reason for Visit: Follow up History from: Patient Primary Neurologist: [facility] (sleep)  ASSESSMENT AND PLAN 58 y.o. year old female   History of Mild OSA Muscle twitching  Tremors  -Order HST/PSG to screen for sleep apnea. Morning headache, nocturia, daytime fatigue, ESS 12. Even if mild sleep apnea, suggest trial of CPAP for at least 6 months to see if helps above symptoms.  -Keep appointment with Dr. Margaret in March   Orders Placed This Encounter  Procedures   Home sleep test   Nocturnal polysomnography   HISTORY OF PRESENT ILLNESS: Today 04/26/24 Here for sleep follow up. HST Dec 2023 showed overall mild sleep apnea.  Total AHI 11/hour. A CPAP was ordered. She did not start treatment. Follow with Dr. Margaret for muscle twitching, tremors. Called in to have sooner visit for another sleep study. A few times has woken up with morning headache. Mentions continues with muscle twitching, mostly internal, her twin sister with similar symptoms, felt to be benign. When she gets up at night to use bathroom, feels more twitches in her calves, also in morning. Unclear if snores, sleeps alone, sometimes feels chest is tight when she waking up. Has daytime fatigue, tired all the time, has been that way for years. Feels when she wakes up her legs are smaller, get back to normal throughout the day. Works as a academic librarian as a runner, broadcasting/film/video. Mentions weight loss, stable from Feb 2025.  HISTORY  UPDATE (02/29/24, VRP): Since last visit, doing better. Sxs improved in Feb / March 2025. Slight increase in April 2025 with increased stress, but still mild. Intermittent twitching sensations. Tremor is minimal.    UPDATE (08/03/23, VRP): Since last visit, doing well until Sept 2024. Had covid, then had more lip swelling in Oct 2024, then more swelling in face and eyes in Nov 2024. Then had more tremors and twitching in [id number] (in setting of prednisone  at end of Nov). Now slightly improved.    04/01/22 Dr. Buck: I saw your patient, Kristin [name], upon your kind request in my sleep clinic today for initial consultation of her sleep disorder, in particular, excessive daytime somnolence.  The patient is unaccompanied today.  As you know, [name] is a 58 year old female with an underlying medical history of irritable bowel syndrome, migraine headaches, paresthesias, allergies, vitamin D deficiency, anxiety, fibromyalgia, hypertension, and vertigo, who reports feeling tired all the time.  She reports that her twin sister recently got diagnosed with sleep apnea and her mom has sleep apnea.  Patient reports that she suspects that she snores.  She is single and lives alone.  She has woken up with chest pain at times, has not had a checked out by primary care yet.  She denies recurrent nocturnal or morning headaches but has nocturia about once per average night.  She does not always get 7 to 8 hours of sleep, she admits that she goes to bed late around 11:30 PM or midnight.  She does watch TV in her bedroom at night and turns the TV off about 10 minutes before she falls asleep, she falls asleep quickly.  She denies any witnessed apneas or sense of gasping at night.  She takes doxepin  at night, takes it close to bedtime.  She has a rise time of 6:15 AM.  She works with infants in a childcare facility.  She is a non-smoker and does not drink any alcohol, caffeine typically in the  form of tea with lemonade with dinner which could be as late as 7:30 PM.  She has an appointment coming up with her primary care and requested blood test results from your office visit.  I suggested we send all her blood test results to Dr. Meredeth electronically, she was agreeable.  Her Epworth sleepiness score is 7 out of 24, fatigue severity score is 31 out of 63.  She has had extensive blood work on 02/23/2022 and I reviewed the results.  I reviewed your office note from 02/23/2022.   REVIEW OF  SYSTEMS: Out of a complete 14 system review of symptoms, the patient complains only of the following symptoms, and all other reviewed systems are negative.  See HPI  ALLERGIES: Allergies  Allergen Reactions   Amoxicillin Hives    Has patient had a PCN reaction causing immediate rash, facial/tongue/throat swelling, SOB or lightheadedness with hypotension: UNKNOWN  Has patient had a PCN reaction causing severe rash involving mucus membranes or skin necrosis:  #  #  #  NO  #  #  #  Has patient had a PCN reaction that required hospitalization  #  #  #  NO  #  #  #  Has patient had a PCN reaction occurring within the last 10 years: #  #  #  YES  #  #  #  If all NO's, may proceed with Cephalosporin use.   Dicyclomine     Other Reaction(s): diarrhea, Other   Doxycycline Hives   Indocin [Indomethacin] Hives   Levaquin [Levofloxacin] Hives   Probiotic [Bacid] Hives   Bacillus Coagulans-Inulin     Other Reaction(s): hives   Losartan  Swelling   Bentyl [Dicyclomine Hcl] Diarrhea   Erythromycin Nausea Only    STOMACH CRAMPS   Keflex [Cephalexin] Rash   Lansoprazole Other (See Comments)    HEADACHE   Nexium [Esomeprazole] Hives and Other (See Comments)    INTOLERANCE >  HEADACHE    Sudafed [Pseudoephedrine Hcl] Other (See Comments)    TACHYCARDIA   Sulfamethoxazole Rash   Tetanus Toxoid-Containing Vaccines Other (See Comments)    LOCAL REACTION...REDNESS/KNOT    HOME MEDICATIONS: Outpatient Medications Prior to Visit  Medication Sig Dispense Refill   acetaminophen  (TYLENOL ) 500 MG tablet Take 500-1,000 mg every 8 (eight) hours as needed by mouth for mild pain (depends on pain if takes 12 tablets).      albuterol  (PROAIR  HFA) 108 (90 Base) MCG/ACT inhaler Inhale 2 puffs into the lungs every 4 (four) hours as needed for wheezing or shortness of breath. 8 g 5   aluminum-magnesium hydroxide-simethicone (MAALOX) 200-200-20 MG/5ML SUSP Take 30 mLs by mouth 2 (two) times daily as needed.      Artificial Tear Solution (SOOTHE XP OP) Apply 2 drops at bedtime to eye.     Ascorbic Acid (VITAMIN C) 1000 MG tablet every other day. (Patient taking differently: Take 1,000 mg by mouth daily.)     atenolol (TENORMIN) 25 MG tablet Take by mouth daily.     B Complex-C (SUPER B COMPLEX PO) Take 1 tablet by mouth daily.     Cholecalciferol (VITAMIN D3) 5000 units CAPS Take 1,000 Units by mouth daily.     frovatriptan (FROVA) 2.5 MG tablet Take 2.5 mg as needed by mouth for migraine (take 1 tablet at onset of migraine and repeat up to twice if needed). If recurs, may repeat after 2 hours. Max of 3 tabs in 24 hours.      Garlic (  GARLIQUE PO) Take 1 capsule by mouth daily.     loperamide (IMODIUM) 2 MG capsule Take 2-4 mg as needed by mouth for diarrhea or loose stools.     loratadine (CLARITIN) 10 MG tablet Take 10 mg by mouth as needed for allergies.     Multiple Vitamin (MULTIVITAMIN) tablet Take 1 tablet by mouth daily.     Omega-3 Fatty Acids (FISH OIL) 1000 MG CAPS 2 capsules daily at 6 (six) AM.     sodium chloride  (OCEAN) 0.65 % SOLN nasal spray Place 1 spray as needed into both nostrils for congestion.     tretinoin (RETIN-A) 0.05 % cream daily at 6 (six) AM.     No facility-administered medications prior to visit.    PAST MEDICAL HISTORY: Past Medical History:  Diagnosis Date   Anxiety    Dizziness    Dysrhythmia    palpitations   Fibromyalgia 04/13/2017   GERD (gastroesophageal reflux disease)    Headache    History of migraine headaches    Hypertension    hx arrhythmia   IBS (irritable bowel syndrome)    Lymphadenopathy    Seasonal allergies    Thyroid  nodule    Vertigo    Vitamin D deficiency     PAST SURGICAL HISTORY: Past Surgical History:  Procedure Laterality Date   DILATION AND CURETTAGE OF UTERUS  2006, 2009   EYE SURGERY Bilateral 1997   LASER   THYOID NODULE  05/2009   FNA...DR. LINDELL .SABRASABRASABRANEG   VAGINAL HYSTERECTOMY  12/2017   LAPAROSCOPICALLY  ASSISTED/DR. ELIZABETH SKINNER   VIDEO ASSISTED THORACOSCOPY (VATS)/ LYMPH NODE SAMPLING Left 05/28/2017   Procedure: LEFT PERISTERNAL EXPLORATION, LEFT VIDEO ASSISTED THORACOSCOPY (VATS), MEDIASTINAL AND AP WINDOW LYMPH NODE DISSECTION;  Surgeon: Army Dallas NOVAK, MD;  Location: [facility] OR;  Service: Thoracic;  Laterality: Left;   VIDEO BRONCHOSCOPY WITH ENDOBRONCHIAL ULTRASOUND N/A 05/17/2017   Procedure: VIDEO BRONCHOSCOPY WITH ENDOBRONCHIAL ULTRASOUND WITH TRANSBRONCHIAL BX OF LYMPH NODES 7, 10 R AND 10 L.;  Surgeon: Army Dallas NOVAK, MD;  Location: [facility] OR;  Service: Thoracic;  Laterality: N/A;    FAMILY HISTORY: Family History  Problem Relation Age of Onset   Hypertension Mother    Diabetes Mother    Hyperthyroidism Mother    Sleep apnea Mother    Hypertension Father    Heart disease Father    Hypothyroidism Father    Cardiomyopathy Father    Other Father 69       CAUSE OF DEATH..CEREBRAL HEMORR   Sleep apnea Sister    Other Maternal Aunt        THYROID  DISEASE   Heart disease Paternal Aunt        CABG   Cardiomyopathy Paternal Aunt    Stomach cancer Paternal Aunt    Cardiomyopathy Paternal Uncle    Heart disease Paternal Uncle        CABG   Diabetes Maternal Grandmother    Hypertension Maternal Grandmother    Heart disease Maternal Grandmother        MI   Other Maternal Grandmother        GRAVES DISEASE   Cardiomyopathy Maternal Grandmother    Other Maternal Grandfather        AAA   Cardiomyopathy Maternal Grandfather    Heart disease Maternal Grandfather        MI   Cancer Paternal Grandmother        UNSPECIFIED SITE   Breast cancer Paternal Grandmother 23 - 1  Hypertension Paternal Grandfather    Stroke Paternal Grandfather        ISCHEMIC STROKE   Other Cousin        THYROID     Breast cancer Cousin 36 - 47    SOCIAL HISTORY: Social History   Socioeconomic History   Marital status: Single    Spouse name: Not on file   Number of children: 0   Years  of education: Not on file   Highest education level: Not on file  Occupational History   Not on file  Tobacco Use   Smoking status: Never   Smokeless tobacco: Never  Vaping Use   Vaping status: Never Used  Substance and Sexual Activity   Alcohol use: No   Drug use: No   Sexual activity: Never    Partners: Male  Other Topics Concern   Not on file  Social History Narrative   Right handed    Caffiene soda small can, sweet tea in evening.  During day drinking water.   Education: college dr   Child care.    Social Drivers of Corporate Investment Banker Strain: Not on file  Food Insecurity: No Food Insecurity (06/19/2022)   Received from [facility]   Hunger Vital Sign    Within the past 12 months, you worried that your food would run out before you got the money to buy more.: Never true    Within the past 12 months, the food you bought just didn't last and you didn't have money to get more.: Never true  Transportation Needs: Not on file  Physical Activity: Not on file  Stress: Not on file  Social Connections: Unknown (11/03/2021)   Received from [facility]   Social Network    Social Network: Not on file  Intimate Partner Violence: Unknown (10/01/2021)   Received from [facility]   HITS    Physically Hurt: Not on file    Insult or Talk Down To: Not on file    Threaten Physical Harm: Not on file    Scream or Curse: Not on file    PHYSICAL EXAM  Vitals:   04/26/24 0749  BP: 137/84  Pulse: 64  SpO2: 99%  Weight: 145 lb 8 oz (66 kg)  Height: 5' 9 (1.753 m)   Body mass index is 21.49 kg/m.  Generalized: Well developed, in no acute distress, anxious  Neurological examination  Mentation: Alert oriented to time, place, history taking. Follows all commands speech and language fluent Cranial nerve II-XII: Pupils were equal round reactive to light. Extraocular movements were full, visual field were full on confrontational test. Facial sensation and strength were  normal.  Head turning and shoulder shrug  were normal and symmetric. Motor: The motor testing reveals 5 over 5 strength of all 4 extremities. Good symmetric motor tone is noted throughout.  Sensory: Sensory testing is intact to soft touch on all 4 extremities. No evidence of extinction is noted.  Coordination: Cerebellar testing reveals good finger-nose-finger and heel-to-shin bilaterally.  Gait and station: Gait is normal.  Reflexes: Deep tendon reflexes are symmetric and normal bilaterally.   DIAGNOSTIC DATA (LABS, IMAGING, TESTING) - I reviewed patient records, labs, notes, testing and imaging myself where available.  Lab Results  Component Value Date   WBC 8.1 03/26/2023   HGB 14.9 03/26/2023   HCT 44.0 03/26/2023   MCV 83.2 03/26/2023   PLT 233 03/26/2023      Component Value Date/Time   NA 140 03/26/2023 1502  NA 142 01/19/2019 1618   K 3.6 03/26/2023 1502   CL 101 03/26/2023 1502   CO2 29 03/26/2023 1502   GLUCOSE 107 (H) 03/26/2023 1502   BUN 13 03/26/2023 1502   BUN 17 01/19/2019 1618   CREATININE 0.67 03/26/2023 1502   CALCIUM 9.5 03/26/2023 1502   PROT 7.5 03/26/2023 1502   PROT 6.7 02/23/2022 1008   ALBUMIN 4.4 03/26/2023 1502   ALBUMIN 4.8 01/19/2019 1618   AST 15 03/26/2023 1502   ALT 13 03/26/2023 1502   ALKPHOS 75 03/26/2023 1502   BILITOT 0.6 03/26/2023 1502   BILITOT 0.3 01/19/2019 1618   GFRNONAA >60 03/26/2023 1502   GFRAA 95 01/19/2019 1618   No results found for: CHOL, HDL, LDLCALC, LDLDIRECT, TRIG, CHOLHDL Lab Results  Component Value Date   HGBA1C 6.1 (H) 02/23/2022   Lab Results  Component Value Date   VITAMINB12 1,050 02/23/2022   Lab Results  Component Value Date   TSH 0.582 02/23/2022    Lauraine Born, AGNP-C, DNP 04/26/2024, 8:16 AM [facility] [address] [place], [place] [address] [phone]

## 2024-05-19 ENCOUNTER — Ambulatory Visit: Admitting: Neurology

## 2024-05-19 DIAGNOSIS — G4733 Obstructive sleep apnea (adult) (pediatric): Secondary | ICD-10-CM

## 2024-06-08 NOTE — Progress Notes (Unsigned)
 SABRA

## 2024-06-09 NOTE — Procedures (Signed)
 Kristin [name]  HOME SLEEP TEST (SANSA) REPORT (Mail-Out Device):   STUDY DATE: 05/25/2024  DOB: [birth year]  MRN: [id number]  ORDERING CLINICIAN: True Mar, MD, PhD   REFERRING CLINICIAN: Lauraine Born, NP  CLINICAL INFORMATION/HISTORY (obtained from visit note dated 04/26/2024): 58 year old female with an underlying medical history of irritable bowel syndrome, migraine headaches, paresthesias, allergies, vitamin D deficiency, anxiety, fibromyalgia, hypertension, and vertigo, who presents for reevaluation of her obstructive sleep apnea.  A home sleep test from December 2023 had shown mild obstructive sleep apnea.  She did not proceed with treatment at the time.  BMI (at the time of sleep clinic visit and/or test date): 21.5 kg/m  FINDINGS:   Study Protocol:    The SANSA single-point-of-skin-contact chest-worn sensor - an FDA cleared and DOT approved type 4 home sleep test device - measures eight physiological channels,  including blood oxygen saturation (measured via PPG [photoplethysmography]), EKG-derived heart rate, respiratory effort, chest movement (measured via accelerometer), snoring, body position, and actigraphy. The device is designed to be worn for up to 10 hours per study.   Sleep Summary:   Total Recording Time (hours, min): 8 hours, 31 min  Total Effective Sleep Time (hours, min):  7 hours, 31 min  Sleep Efficiency (%):    88%   Respiratory Indices:   Calculated sAHI (per hour):  14.7/hour         Oxygen Saturation Statistics:    Oxygen Saturation (%) Mean: 94.8%   Minimum oxygen saturation (%):                 85.3%   O2 Saturation Range (%): 85.3-99.9%   Time below or at 88% saturation: <1 min   Pulse Rate Statistics:   Pulse Mean (bpm):    62/min    Pulse Range (49-96/min)   Snoring: Mild, intermittent  IMPRESSION/DIAGNOSES:   OSA (obstructive sleep apnea), mild  RECOMMENDATIONS:   This home sleep test demonstrates  overall mild obstructive sleep apnea with a total AHI of 14.7/hour and O2 nadir of 85.3%. Snoring was detected, intermittently, in the mild range. Given the patient's medical history and sleep related complaints, therapy with a positive airway pressure device is a reasonable first-line choice and clinically recommended. Treatment can be achieved in the form of autoPAP trial/titration at home for now.  An initial treatment setting of 5 to 11 cm with EPR of 2, mask of choice, sized to fit, is recommended.  A full night, in-lab PAP titration study may aid in improving proper treatment settings and with mask fit, if needed, down the road.  Alternative treatments may include weight loss (where appropriate) along with avoidance of the supine sleep position (if possible), or an oral appliance in appropriate candidates.   Please note that untreated obstructive sleep apnea may carry additional perioperative morbidity. Patients with significant obstructive sleep apnea should receive perioperative PAP therapy and the surgeons and particularly the anesthesiologist should be informed of the diagnosis and the severity of the sleep disordered breathing. The patient should be cautioned not to drive, work at heights, or operate dangerous or heavy equipment when tired or sleepy. Review and reiteration of good sleep hygiene measures should be pursued with any patient. Other causes of the patient's symptoms, including circadian rhythm disturbances, an underlying mood disorder, medication effect and/or an underlying medical problem cannot be ruled out based on this test. Clinical correlation is recommended.  The patient and her referring provider will be notified of the test results.  The patient will be seen in follow up in sleep clinic at [facility], as necessary.  I certify that I have reviewed the raw data recording prior to the issuance of this report in accordance with the standards of the American Academy of Sleep Medicine  (AASM).    INTERPRETING PHYSICIAN:   True Mar, MD, PhD Medical Director, [name]) Diplomat, ABPN (Neurology and Sleep)   [name] [address], [address] [place], [place] [address] [phone]

## 2024-06-13 ENCOUNTER — Ambulatory Visit: Payer: Self-pay | Admitting: Neurology

## 2024-06-13 DIAGNOSIS — G4733 Obstructive sleep apnea (adult) (pediatric): Secondary | ICD-10-CM

## 2024-06-13 NOTE — Telephone Encounter (Signed)
 Please call, had HST 05/25/2024.  Study showed overall mild OSA with a total AHI of 14.7/hour and O2 nadir of 85.3%. Snoring was detected, intermittently, in the mild range. Given the patient's medical history and sleep related complaints, therapy with a positive airway pressure device is a reasonable first-line choice and clinically recommended.   I will order a CPAP, ResMed 5-11 cmH2O with EPR 2, mask of choice.  Follow-up 30 to 90 days of starting new CPAP.   Orders Placed This Encounter  Procedures   For home use only DME continuous positive airway pressure (CPAP)

## 2024-06-14 NOTE — Telephone Encounter (Signed)
 Lvm 1st attempt by hf 06/14/24

## 2024-06-14 NOTE — Telephone Encounter (Signed)
-----   Message from Lauraine JINNY Born, NP sent at 06/13/2024  8:18 AM EST -----

## 2024-06-15 NOTE — Telephone Encounter (Signed)
 Called and relayed results. Pt agreeable to cpap.  Sent to [facility]  [phone]  Scheduled appt

## 2024-06-15 NOTE — Telephone Encounter (Signed)
 Pt has returned call to [facility]

## 2024-06-28 ENCOUNTER — Telehealth: Payer: Self-pay

## 2024-06-28 NOTE — Telephone Encounter (Signed)
 Order sent to [facility]  Order sent to [facility]

## 2024-08-08 ENCOUNTER — Ambulatory Visit: Admitting: Neurology

## 2024-08-28 ENCOUNTER — Ambulatory Visit: Admitting: Diagnostic Neuroimaging

## 2024-08-31 ENCOUNTER — Encounter: Admitting: Neurology

## 2025-02-14 ENCOUNTER — Encounter (INDEPENDENT_AMBULATORY_CARE_PROVIDER_SITE_OTHER): Admitting: Ophthalmology
